# Patient Record
Sex: Female | Born: 1955 | Race: White | Hispanic: No | Marital: Married | Smoking: Former smoker
Health system: Southern US, Community
[De-identification: ages and names within clinical notes are randomized; demographics above are authoritative.]

## PROBLEM LIST (undated history)

## (undated) ENCOUNTER — Encounter: Attending: Certified Registered" | Primary: Certified Registered"

## (undated) ENCOUNTER — Telehealth: Attending: Certified Registered" | Primary: Certified Registered"

## (undated) ENCOUNTER — Telehealth

## (undated) ENCOUNTER — Encounter

## (undated) ENCOUNTER — Ambulatory Visit

## (undated) ENCOUNTER — Encounter: Attending: Physician Assistant | Primary: Physician Assistant

## (undated) ENCOUNTER — Encounter
Attending: Pharmacist Clinician (PhC)/ Clinical Pharmacy Specialist | Primary: Pharmacist Clinician (PhC)/ Clinical Pharmacy Specialist

## (undated) ENCOUNTER — Ambulatory Visit: Payer: PRIVATE HEALTH INSURANCE | Attending: Gastroenterology | Primary: Gastroenterology

## (undated) ENCOUNTER — Encounter: Attending: Gastroenterology | Primary: Gastroenterology

## (undated) ENCOUNTER — Ambulatory Visit: Payer: PRIVATE HEALTH INSURANCE

## (undated) ENCOUNTER — Ambulatory Visit: Payer: MEDICARE

## (undated) ENCOUNTER — Encounter: Attending: Rheumatology | Primary: Rheumatology

## (undated) ENCOUNTER — Ambulatory Visit: Payer: PRIVATE HEALTH INSURANCE | Attending: Nutritionist | Primary: Nutritionist

## (undated) ENCOUNTER — Inpatient Hospital Stay

## (undated) ENCOUNTER — Encounter: Attending: Family | Primary: Family

## (undated) ENCOUNTER — Ambulatory Visit
Payer: PRIVATE HEALTH INSURANCE | Attending: Student in an Organized Health Care Education/Training Program | Primary: Student in an Organized Health Care Education/Training Program

## (undated) DIAGNOSIS — M79643 Pain in unspecified hand: Secondary | ICD-10-CM

## (undated) DIAGNOSIS — L570 Actinic keratosis: Secondary | ICD-10-CM

## (undated) DIAGNOSIS — G4730 Sleep apnea, unspecified: Secondary | ICD-10-CM

## (undated) DIAGNOSIS — T753XXA Motion sickness, initial encounter: Secondary | ICD-10-CM

## (undated) DIAGNOSIS — H9190 Unspecified hearing loss, unspecified ear: Secondary | ICD-10-CM

## (undated) DIAGNOSIS — R748 Abnormal levels of other serum enzymes: Secondary | ICD-10-CM

## (undated) DIAGNOSIS — K652 Spontaneous bacterial peritonitis: Secondary | ICD-10-CM

## (undated) DIAGNOSIS — D649 Anemia, unspecified: Secondary | ICD-10-CM

## (undated) DIAGNOSIS — K7460 Unspecified cirrhosis of liver: Secondary | ICD-10-CM

## (undated) DIAGNOSIS — M1990 Unspecified osteoarthritis, unspecified site: Secondary | ICD-10-CM

## (undated) DIAGNOSIS — D509 Iron deficiency anemia, unspecified: Secondary | ICD-10-CM

## (undated) DIAGNOSIS — K219 Gastro-esophageal reflux disease without esophagitis: Secondary | ICD-10-CM

## (undated) DIAGNOSIS — Z8614 Personal history of Methicillin resistant Staphylococcus aureus infection: Secondary | ICD-10-CM

## (undated) DIAGNOSIS — R42 Dizziness and giddiness: Secondary | ICD-10-CM

## (undated) DIAGNOSIS — E039 Hypothyroidism, unspecified: Secondary | ICD-10-CM

## (undated) DIAGNOSIS — M542 Cervicalgia: Secondary | ICD-10-CM

## (undated) DIAGNOSIS — K754 Autoimmune hepatitis: Secondary | ICD-10-CM

## (undated) HISTORY — DX: Iron deficiency anemia, unspecified: D50.9

## (undated) HISTORY — PX: NASAL SINUS SURGERY: SHX719

## (undated) HISTORY — DX: Actinic keratosis: L57.0

## (undated) HISTORY — DX: Sleep apnea, unspecified: G47.30

## (undated) HISTORY — PX: OTHER SURGICAL HISTORY: SHX169

## (undated) HISTORY — DX: Unspecified cirrhosis of liver: K74.60

## (undated) HISTORY — PX: ABDOMINAL HYSTERECTOMY: SHX81

## (undated) HISTORY — PX: SALPINGOOPHORECTOMY: SHX82

## (undated) HISTORY — DX: Autoimmune hepatitis: K75.4

## (undated) HISTORY — DX: Abnormal levels of other serum enzymes: R74.8

## (undated) HISTORY — DX: Pain in unspecified hand: M79.643

## (undated) HISTORY — DX: Personal history of Methicillin resistant Staphylococcus aureus infection: Z86.14

## (undated) MED ORDER — POLYETHYLENE GLYCOL 3350 17 GRAM ORAL POWDER PACKET: Freq: Every day | ORAL | 0 days | PRN

## (undated) MED ORDER — OXYBUTYNIN CHLORIDE ER 10 MG TABLET,EXTENDED RELEASE 24 HR: Freq: Every day | ORAL | 0 days

---
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
## 2004-09-12 ENCOUNTER — Ambulatory Visit: Payer: Self-pay | Admitting: Rheumatology

## 2004-11-12 ENCOUNTER — Ambulatory Visit: Payer: Self-pay | Admitting: Obstetrics and Gynecology

## 2005-11-18 ENCOUNTER — Ambulatory Visit: Payer: Self-pay | Admitting: Obstetrics and Gynecology

## 2006-02-25 ENCOUNTER — Ambulatory Visit: Payer: Self-pay | Admitting: Unknown Physician Specialty

## 2006-03-30 ENCOUNTER — Ambulatory Visit: Payer: Self-pay | Admitting: Unknown Physician Specialty

## 2006-04-03 ENCOUNTER — Emergency Department: Payer: Self-pay | Admitting: Emergency Medicine

## 2006-11-23 ENCOUNTER — Ambulatory Visit: Payer: Self-pay | Admitting: Obstetrics and Gynecology

## 2007-11-30 ENCOUNTER — Ambulatory Visit: Payer: Self-pay | Admitting: Obstetrics and Gynecology

## 2008-12-05 ENCOUNTER — Ambulatory Visit: Payer: Self-pay

## 2008-12-18 ENCOUNTER — Emergency Department: Payer: Self-pay | Admitting: Emergency Medicine

## 2010-02-06 ENCOUNTER — Ambulatory Visit: Payer: Self-pay

## 2011-02-18 ENCOUNTER — Ambulatory Visit: Payer: Self-pay | Admitting: Obstetrics and Gynecology

## 2011-07-19 ENCOUNTER — Ambulatory Visit: Payer: Self-pay | Admitting: Internal Medicine

## 2012-03-19 HISTORY — PX: INNER EAR SURGERY: SHX679

## 2012-04-13 ENCOUNTER — Ambulatory Visit: Payer: Self-pay | Admitting: Obstetrics and Gynecology

## 2013-01-27 DIAGNOSIS — E039 Hypothyroidism, unspecified: Secondary | ICD-10-CM | POA: Insufficient documentation

## 2013-04-27 ENCOUNTER — Ambulatory Visit: Payer: Self-pay | Admitting: Obstetrics and Gynecology

## 2013-08-10 ENCOUNTER — Ambulatory Visit: Payer: Self-pay | Admitting: Rheumatology

## 2013-08-14 ENCOUNTER — Emergency Department: Payer: Self-pay | Admitting: Emergency Medicine

## 2013-08-14 LAB — BASIC METABOLIC PANEL
Anion Gap: 5 — ABNORMAL LOW (ref 7–16)
BUN: 8 mg/dL (ref 7–18)
Calcium, Total: 9 mg/dL (ref 8.5–10.1)
Chloride: 99 mmol/L (ref 98–107)
Co2: 30 mmol/L (ref 21–32)
Creatinine: 0.76 mg/dL (ref 0.60–1.30)
EGFR (African American): >60
EGFR (Non-African Amer.): >60
Glucose: 108 mg/dL — ABNORMAL HIGH (ref 65–99)
Osmolality: 267 (ref 275–301)
Potassium: 2.8 mmol/L — ABNORMAL LOW (ref 3.5–5.1)
Sodium: 134 mmol/L — ABNORMAL LOW (ref 136–145)

## 2013-08-14 LAB — CBC
HGB: 14.6 g/dL (ref 12.0–16.0)
MCH: 32.5 pg (ref 26.0–34.0)
RDW: 12.4 % (ref 11.5–14.5)

## 2013-08-14 LAB — TROPONIN I: Troponin-I: <0.02 ng/mL

## 2013-08-14 LAB — SEDIMENTATION RATE: Erythrocyte Sed Rate: 47 mm/hr — ABNORMAL HIGH (ref 0–30)

## 2013-08-15 ENCOUNTER — Other Ambulatory Visit: Payer: Self-pay | Admitting: Rheumatology

## 2013-08-15 LAB — HEPATIC FUNCTION PANEL A (ARMC)
Alkaline Phosphatase: 96 U/L (ref 50–136)
Bilirubin, Direct: 0.2 mg/dL (ref 0.00–0.20)
SGOT(AST): 80 U/L — ABNORMAL HIGH (ref 15–37)
Total Protein: 8.2 g/dL (ref 6.4–8.2)

## 2013-08-20 LAB — CULTURE, BLOOD (SINGLE)

## 2014-05-02 DIAGNOSIS — H900 Conductive hearing loss, bilateral: Secondary | ICD-10-CM | POA: Insufficient documentation

## 2014-07-24 ENCOUNTER — Ambulatory Visit: Payer: Self-pay | Admitting: Obstetrics and Gynecology

## 2014-12-20 DIAGNOSIS — M79643 Pain in unspecified hand: Secondary | ICD-10-CM

## 2014-12-20 HISTORY — DX: Pain in unspecified hand: M79.643

## 2015-01-25 ENCOUNTER — Ambulatory Visit
Admit: 2015-01-25 | Disposition: A | Discharge status: Home / Self Care | Payer: Self-pay | Attending: Rheumatology | Admitting: Rheumatology

## 2015-01-29 ENCOUNTER — Ambulatory Visit
Admit: 2015-01-29 | Disposition: A | Discharge status: Home / Self Care | Payer: Self-pay | Attending: Gastroenterology | Admitting: Gastroenterology

## 2015-01-31 ENCOUNTER — Ambulatory Visit
Admit: 2015-01-31 | Disposition: A | Discharge status: Home / Self Care | Payer: Self-pay | Attending: Gastroenterology | Admitting: Gastroenterology

## 2015-02-16 ENCOUNTER — Other Ambulatory Visit: Payer: Self-pay | Admitting: Gastroenterology

## 2015-02-16 DIAGNOSIS — R945 Abnormal results of liver function studies: Principal | ICD-10-CM

## 2015-02-16 DIAGNOSIS — R7989 Other specified abnormal findings of blood chemistry: Secondary | ICD-10-CM

## 2015-03-05 ENCOUNTER — Other Ambulatory Visit: Payer: Self-pay | Admitting: Radiology

## 2015-03-06 ENCOUNTER — Ambulatory Visit
Admission: RE | Admit: 2015-03-06 | Discharge: 2015-03-06 | Disposition: A | Discharge status: Home / Self Care | Payer: BC Managed Care – PPO | Source: Ambulatory Visit | Attending: Gastroenterology | Admitting: Gastroenterology

## 2015-03-06 DIAGNOSIS — K7460 Unspecified cirrhosis of liver: Secondary | ICD-10-CM | POA: Diagnosis not present

## 2015-03-06 DIAGNOSIS — K739 Chronic hepatitis, unspecified: Secondary | ICD-10-CM | POA: Insufficient documentation

## 2015-03-06 DIAGNOSIS — R945 Abnormal results of liver function studies: Secondary | ICD-10-CM

## 2015-03-06 DIAGNOSIS — R740 Nonspecific elevation of levels of transaminase and lactic acid dehydrogenase [LDH]: Secondary | ICD-10-CM | POA: Diagnosis present

## 2015-03-06 DIAGNOSIS — R7989 Other specified abnormal findings of blood chemistry: Secondary | ICD-10-CM

## 2015-03-06 HISTORY — DX: Anemia, unspecified: D64.9

## 2015-03-06 HISTORY — DX: Unspecified hearing loss, unspecified ear: H91.90

## 2015-03-06 HISTORY — DX: Unspecified osteoarthritis, unspecified site: M19.90

## 2015-03-06 HISTORY — DX: Gastro-esophageal reflux disease without esophagitis: K21.9

## 2015-03-06 HISTORY — DX: Hypothyroidism, unspecified: E03.9

## 2015-03-06 LAB — CBC
HEMATOCRIT: 39.2 % (ref 35.0–47.0)
HEMOGLOBIN: 13.1 g/dL (ref 12.0–16.0)
MCH: 30.5 pg (ref 26.0–34.0)
MCHC: 33.5 g/dL (ref 32.0–36.0)
MCV: 91.2 fL (ref 80.0–100.0)
PLATELETS: 67 10*3/uL — AB (ref 150–440)
RBC: 4.3 MIL/uL (ref 3.80–5.20)
RDW: 13.2 % (ref 11.5–14.5)
WBC: 4.3 10*3/uL (ref 3.6–11.0)

## 2015-03-06 LAB — PROTIME-INR
INR: 1.27
Prothrombin Time: 16.1 seconds — ABNORMAL HIGH (ref 11.4–15.0)

## 2015-03-06 LAB — APTT: aPTT: 33 seconds (ref 24–36)

## 2015-03-06 MED ORDER — SODIUM CHLORIDE 0.9 % IV SOLN
Freq: Once | INTRAVENOUS | Status: AC
  Administered 2015-03-06: 10 mL via INTRAVENOUS

## 2015-03-06 MED ORDER — FENTANYL CITRATE (PF) 100 MCG/2ML IJ SOLN
INTRAMUSCULAR | Status: AC | PRN
  Administered 2015-03-06: 50 ug via INTRAVENOUS

## 2015-03-06 MED ORDER — MIDAZOLAM HCL 5 MG/5ML IJ SOLN
INTRAMUSCULAR | Status: AC | PRN
  Administered 2015-03-06: 1 mg via INTRAVENOUS

## 2015-03-06 NOTE — Progress Notes (Signed)
BP low post sedation-87/48. Fluid bolus given.

## 2015-03-06 NOTE — Procedures (Signed)
Interventional Radiology Procedure Note  Procedure: US guided medical-liver biopsy, for transaminitis.  Complications: None Recommendations:  - observation for 2 hours - follow up pathology - advil/ibuprofen for any routine discomfort  Signed,  Dulcy Fanny. Earleen Newport, DO

## 2015-03-06 NOTE — H&P (Signed)
Chief Complaint: Transaminitis  Referring Physician(s): Dr. Lucilla Lame  History of Present Illness: Barbara Moss is a 59 y.o. female presenting for an elective, outpatient percutaneous biopsy to evaluate for etiology of transaminitis.    She has a history negative for hepatitis viral infection, with negative serology.  Pre-operative laboratory studies also reveal thrombocytopenia, with a value of 67K.  This will not strictly preclude the percutaneous sample, but informed consent will be obtained for increased risk of bleeding.     Past Medical History  Diagnosis Date  . Hypothyroidism   . GERD (gastroesophageal reflux disease)   . Arthritis   . Anemia   . Hearing decreased     Past Surgical History  Procedure Laterality Date  . Abdominal hysterectomy      Allergies: Review of patient's allergies indicates no known allergies.  Medications: Prior to Admission medications   Medication Sig Start Date End Date Taking? Authorizing Provider  calcium-vitamin D (OSCAL WITH D) 500-200 MG-UNIT per tablet Take 1 tablet by mouth.   Yes Historical Provider, MD  estropipate (OGEN) 0.75 MG tablet Take 0.75 mg by mouth daily.   Yes Historical Provider, MD  ferrous fumarate (HEMOCYTE - 106 MG FE) 325 (106 FE) MG TABS tablet Take 1 tablet by mouth.   Yes Historical Provider, MD  folic acid (FOLVITE) 1 MG tablet Take 2 mg by mouth daily.   Yes Historical Provider, MD  Golimumab 100 MG/ML SOAJ Inject into the skin.   Yes Historical Provider, MD  hydrochlorothiazide (HYDRODIURIL) 25 MG tablet Take 25 mg by mouth daily.   Yes Historical Provider, MD  IRON, FERROUS GLUCONATE, PO Take 65 mg by mouth.   Yes Historical Provider, MD  levothyroxine (SYNTHROID, LEVOTHROID) 125 MCG tablet Take 125 mcg by mouth daily before breakfast.   Yes Historical Provider, MD  omeprazole (PRILOSEC) 10 MG capsule Take 10 mg by mouth daily.   Yes Historical Provider, MD  vitamin B-12 (CYANOCOBALAMIN) 1000 MCG  tablet Take 1,000 mcg by mouth daily.   Yes Historical Provider, MD     History reviewed. No pertinent family history.  History   Social History  . Marital Status: Married    Spouse Name: N/A  . Number of Children: N/A  . Years of Education: N/A   Social History Main Topics  . Smoking status: Former Research scientist (life sciences)  . Smokeless tobacco: Not on file  . Alcohol Use: Yes     Comment: occasioanl  . Drug Use: No  . Sexual Activity: Not on file   Other Topics Concern  . None   Social History Narrative    Review of Systems: A 12 point ROS discussed and pertinent positives are indicated in the HPI above.  All other systems are negative.  Review of Systems  Vital Signs: BP 105/56 mmHg  Pulse 69  Temp(Src) 98 F (36.7 C) (Oral)  Resp 18  Ht 4\' 11"  (1.499 m)  Wt 135 lb (61.236 kg)  BMI 27.25 kg/m2  Physical Exam   Atraumatic, normocephalic. Mucous membranes moist, pink.  Neck soft supple.  Vital signs WNL.  CTA bilateral.  Neg for W/R/R RRR with no third heart sounds.   Abd soft, NT, ND.   GU deferred. Moving all 4 extremities.  Sensory grossly intact.    Mallampati Score:  2  Imaging: US shows irregular liver echogenicity.  Borderline enlarged spleen.   Labs:  CBC:  Recent Labs  03/06/15 0716  WBC 4.3  HGB 13.1  HCT  39.2  PLT 67*    COAGS:  Recent Labs  03/06/15 0716  INR 1.27  APTT 33    BMP: No results for input(s): NA, K, CL, CO2, GLUCOSE, BUN, CALCIUM, CREATININE, GFRNONAA, GFRAA in the last 8760 hours.  Invalid input(s): CMP  LIVER FUNCTION TESTS: No results for input(s): BILITOT, AST, ALT, ALKPHOS, PROT, ALBUMIN in the last 8760 hours.  TUMOR MARKERS: No results for input(s): AFPTM, CEA, CA199, CHROMGRNA in the last 8760 hours.  Assessment and Plan:  59 yo female for percutaneous liver biopsy to evaluate for a reason for transaminitis.    She also has thrombocytopenia.   Risks and Benefits discussed with the patient including, but  not limited to bleeding, infection, damage to adjacent structures or low yield requiring additional tests.  In particular, her thrombocytopenia was discussed, with the benefit of evaluating the potential liver dysfunction outweighing the slight increased risk of bleeding.    All of the patient's questions were answered, patient is agreeable to proceed. Consent signed and in chart.  Signed,  Dulcy Fanny. Earleen Newport, DO     Thank you for this interesting consult.  I greatly enjoyed meeting Barbara Moss and look forward to participating in their care.  SignedCorrie Mckusick 03/06/2015, 8:13 AM   I spent a total of  20 Minutes   in face to face in clinical consultation, greater than 50% of which was counseling/coordinating care for percutaneous liver biopsy, transaminitis.

## 2015-03-08 LAB — SURGICAL PATHOLOGY

## 2015-03-19 ENCOUNTER — Ambulatory Visit (INDEPENDENT_AMBULATORY_CARE_PROVIDER_SITE_OTHER): Payer: BC Managed Care – PPO | Admitting: Gastroenterology

## 2015-03-19 ENCOUNTER — Encounter: Payer: Self-pay | Admitting: Gastroenterology

## 2015-03-19 ENCOUNTER — Other Ambulatory Visit: Payer: Self-pay

## 2015-03-19 VITALS — BP 121/64 | HR 79 | Temp 97.9°F | Ht 59.0 in | Wt 141.0 lb

## 2015-03-19 DIAGNOSIS — G4730 Sleep apnea, unspecified: Secondary | ICD-10-CM

## 2015-03-19 DIAGNOSIS — R748 Abnormal levels of other serum enzymes: Secondary | ICD-10-CM | POA: Insufficient documentation

## 2015-03-19 DIAGNOSIS — K7460 Unspecified cirrhosis of liver: Secondary | ICD-10-CM

## 2015-03-19 DIAGNOSIS — E079 Disorder of thyroid, unspecified: Secondary | ICD-10-CM

## 2015-03-19 DIAGNOSIS — D509 Iron deficiency anemia, unspecified: Secondary | ICD-10-CM | POA: Insufficient documentation

## 2015-03-19 DIAGNOSIS — M059 Rheumatoid arthritis with rheumatoid factor, unspecified: Secondary | ICD-10-CM | POA: Insufficient documentation

## 2015-03-19 DIAGNOSIS — T7840XA Allergy, unspecified, initial encounter: Secondary | ICD-10-CM | POA: Insufficient documentation

## 2015-03-19 DIAGNOSIS — K219 Gastro-esophageal reflux disease without esophagitis: Secondary | ICD-10-CM | POA: Insufficient documentation

## 2015-03-19 HISTORY — DX: Unspecified cirrhosis of liver: K74.60

## 2015-03-19 HISTORY — DX: Iron deficiency anemia, unspecified: D50.9

## 2015-03-19 HISTORY — DX: Abnormal levels of other serum enzymes: R74.8

## 2015-03-19 HISTORY — DX: Disorder of thyroid, unspecified: E07.9

## 2015-03-19 HISTORY — DX: Sleep apnea, unspecified: G47.30

## 2015-03-19 MED ORDER — PREDNISONE 10 MG PO TABS: 60.0000 mg | ORAL_TABLET | Freq: Every day | ORAL | Status: DC

## 2015-03-19 NOTE — Progress Notes (Signed)
Primary Care Physician: Emmaline Kluver, MD  Primary Gastroenterologist:  Dr. Lucilla Lame  Chief Complaint  Patient presents with  . follow up liver biospsy    HPI: Barbara Moss is a 59 y.o. female here follow-up after having a liver biopsy. The patient was found to have 3 out of 4 inflammation and 4 out of 4 stage of damage consistent with cirrhosis. The etiology of the cirrhosis could not be obtained through the liver biopsy due to the amount of cirrhosis on the liver biopsy.  Current Outpatient Prescriptions  Medication Sig Dispense Refill  . B COMPLEX VITAMINS ER PO Take by mouth daily.    . calcium-vitamin D (OSCAL WITH D) 500-200 MG-UNIT per tablet Take 1 tablet by mouth.    . ESTROPIPATE PO Take by mouth daily.    . ferrous fumarate (HEMOCYTE - 106 MG FE) 325 (106 FE) MG TABS tablet Take 1 tablet by mouth.    . folic acid (FOLVITE) 1 MG tablet Take 2 mg by mouth daily.    . Golimumab 50 MG/0.5ML SOAJ Inject 50 mLs into the skin.    . hydrochlorothiazide (HYDRODIURIL) 25 MG tablet Take 25 mg by mouth daily.    Marland Kitchen levothyroxine (SYNTHROID, LEVOTHROID) 125 MCG tablet Take 125 mcg by mouth daily before breakfast.    . omeprazole (PRILOSEC) 40 MG capsule Take 40 mg by mouth daily.    . vitamin B-12 (CYANOCOBALAMIN) 1000 MCG tablet Take 1,000 mcg by mouth daily.    Marland Kitchen estropipate (OGEN) 0.75 MG tablet Take 0.75 mg by mouth daily.    . Golimumab 100 MG/ML SOAJ Inject into the skin.    . hydrochlorothiazide (HYDRODIURIL) 25 MG tablet Take 25 mg by mouth daily.    . IRON, FERROUS GLUCONATE, PO Take 65 mg by mouth.    . IRON, IRON, Take 65 mg by mouth daily.    Marland Kitchen levothyroxine (SYNTHROID, LEVOTHROID) 100 MCG tablet Take 100 mcg by mouth daily.    Marland Kitchen omeprazole (PRILOSEC) 10 MG capsule Take 10 mg by mouth daily.    . predniSONE (DELTASONE) 10 MG tablet Take 6 tablets (60 mg total) by mouth daily with breakfast. 60mg /day for 1 week then 40mg /day for a week then 30mg /day for 2  weeks then 20 mg a day. 102 tablet 6   No current facility-administered medications for this visit.    Allergies as of 03/19/2015  . (No Known Allergies)    ROS:  General: Negative for anorexia, weight loss, fever, chills, fatigue, weakness. ENT: Negative for hoarseness, difficulty swallowing , nasal congestion. CV: Negative for chest pain, angina, palpitations, dyspnea on exertion, peripheral edema.  Respiratory: Negative for dyspnea at rest, dyspnea on exertion, cough, sputum, wheezing.  GI: See history of present illness. GU:  Negative for dysuria, hematuria, urinary incontinence, urinary frequency, nocturnal urination.  Endo: Negative for unusual weight change.    Physical Examination:   BP 121/64 mmHg  Pulse 79  Temp(Src) 97.9 F (36.6 C) (Oral)  Ht 4\' 11"  (1.499 m)  Wt 141 lb (63.957 kg)  BMI 28.46 kg/m2  General: Well-nourished, well-developed in no acute distress.  Eyes: No icterus. Conjunctivae pink. Mouth: Oropharyngeal mucosa moist and pink , no lesions erythema or exudate. Lungs: Clear to auscultation bilaterally. Non-labored. Heart: Regular rate and rhythm, no murmurs rubs or gallops.  Abdomen: Bowel sounds are normal, nontender, nondistended, no hepatosplenomegaly or masses, no abdominal bruits or hernia , no rebound or guarding.   Extremities: No lower extremity  edema. No clubbing or deformities. Neuro: Alert and oriented x 3.  Grossly intact. Skin: Warm and dry, no jaundice.   Psych: Alert and cooperative, normal mood and affect.   Imaging Studies: US Biopsy  03/06/2015   CLINICAL DATA:  59 year old female with a history of transaminitis. She has been referred for percutaneous biopsy.  EXAM: ULTRASOUND GUIDED CORE BIOPSY OF LIVER  MEDICATIONS: 1.0 mg IV Versed; 50 mcg IV Fentanyl  Total Moderate Sedation Time: 17 minutes  PROCEDURE: The procedure, risks, benefits, and alternatives were explained to the patient. Questions regarding the procedure were  encouraged and answered. The patient understands and consents to the procedure.  Ultrasound survey was performed with images stored and sent to PACs.  The right upper quadrant was prepped with chlorhexidine in a sterile fashion, and a sterile drape was applied covering the operative field. A sterile gown and sterile gloves were used for the procedure. Local anesthesia was provided with 1% Lidocaine.  Ultrasound guidance was used to infiltrate the skin and subcutaneous tissues to the level of the liver capsule for anesthesia. A small stab incision was then made with 11 blade scalpel, and a 17 gauge guide needle was advanced into the right liver lobe. Three separate 18 gauge core biopsy were then retrieved with ultrasound observation. Images were stored.  Once the samples were placed into formalin solution, 4 Gel-Foam pledgets were infused with a small amount of sterile saline. Needle was removed. Final images were stored.  Patient tolerated the procedure well and remained hemodynamically stable throughout.  No complications were encountered and no significant blood loss was encounter  COMPLICATIONS: None.  FINDINGS: Ultrasound survey demonstrates heterogeneous liver parenchyma.  Images during the case demonstrate safe placement of 17 gauge guide needle into the right liver lobe. Three separate 18 gauge core biopsy were retrieved.  After infusion of Gel-Foam pledgets a final image demonstrates gas within the liver parenchyma, with no fluid on the liver capsule.  IMPRESSION: Status post ultrasound-guided medical liver biopsy of right liver lobe, with tissue specimen sent to pathology for complete histopathologic analysis.  Signed,  Dulcy Fanny. Earleen Newport DO  [facility]  [facility]  INDICATION: 59 year old female with a history of transaminitis.   Electronically Signed   By: Corrie Mckusick D.O.   On: 03/06/2015 11:12

## 2015-03-19 NOTE — Assessment & Plan Note (Signed)
This patient is a 59 year old woman who comes in for follow-up of her liver biopsy. The patient's liver biopsy showed cirrhosis with inflammation 3 out of 4. 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 The patient will be started on steroid-induced for the autoimmune hepatitis and she will follow up in 4 weeks

## 2015-03-20 ENCOUNTER — Telehealth: Payer: Self-pay | Admitting: Gastroenterology

## 2015-03-20 NOTE — Telephone Encounter (Signed)
Pt said medication was not faxed in yesterday to [facility]. In [place] # [phone]

## 2015-03-21 ENCOUNTER — Other Ambulatory Visit: Payer: Self-pay

## 2015-03-21 DIAGNOSIS — K7460 Unspecified cirrhosis of liver: Secondary | ICD-10-CM

## 2015-03-21 MED ORDER — PREDNISONE 10 MG PO TABS: 60.0000 mg | ORAL_TABLET | Freq: Every day | ORAL | Status: DC

## 2015-03-21 NOTE — Telephone Encounter (Signed)
Spoke with pt and informed her I will call in her rx to her pharmacy. Advised her to let me know if there is a problem.

## 2015-04-17 ENCOUNTER — Ambulatory Visit (INDEPENDENT_AMBULATORY_CARE_PROVIDER_SITE_OTHER): Payer: BC Managed Care – PPO | Admitting: Gastroenterology

## 2015-04-17 VITALS — BP 122/59 | HR 80 | Temp 98.1°F | Ht 59.0 in | Wt 141.0 lb

## 2015-04-17 DIAGNOSIS — R748 Abnormal levels of other serum enzymes: Secondary | ICD-10-CM

## 2015-04-17 NOTE — Progress Notes (Signed)
Primary Care Physician: Emmaline Kluver, MD  Primary Gastroenterologist:  Dr. Lucilla Lame  Chief Complaint  Patient presents with  . F/U cirrhosis    4 weeks    HPI: Barbara Moss is a 59 y.o. female here for follow-up of abnormal liver enzymes. This patient has biopsy-proven cirrhosis. She also had a high IgG with a high ANA area the patient's liver biopsy did not show the cause for her cirrhosis or abnormal liver enzymes but with her history of rheumatoid arthritis and with her abnormal liver enzymes autoimmune hepatitis was the most likely cause. The patient was started on steroids and states she is now down to 3 tablets a day. She reports that she has some abdominal bloating in the upper abdomen otherwise she is doing well.  Current Outpatient Prescriptions  Medication Sig Dispense Refill  . B COMPLEX VITAMINS ER PO Take by mouth daily.    . calcium-vitamin D (OSCAL WITH D) 500-200 MG-UNIT per tablet Take 1 tablet by mouth.    . ferrous fumarate (HEMOCYTE - 106 MG FE) 325 (106 FE) MG TABS tablet Take 1 tablet by mouth.    . folic acid (FOLVITE) 1 MG tablet Take 2 mg by mouth daily.    . Golimumab 50 MG/0.5ML SOAJ Inject 50 mLs into the skin.    . hydrochlorothiazide (HYDRODIURIL) 25 MG tablet Take 25 mg by mouth daily.    Marland Kitchen levothyroxine (SYNTHROID, LEVOTHROID) 100 MCG tablet Take 100 mcg by mouth daily.    Marland Kitchen omeprazole (PRILOSEC) 10 MG capsule Take 10 mg by mouth daily.    . predniSONE (DELTASONE) 10 MG tablet Take 6 tablets (60 mg total) by mouth daily with breakfast. 60mg /day for 1 week then 40mg /day for a week then 30mg /day for 2 weeks then 20 mg a day. 102 tablet 6  . estropipate (OGEN) 0.75 MG tablet Take 0.75 mg by mouth daily.    Marland Kitchen ESTROPIPATE PO Take by mouth daily.    Marland Kitchen omeprazole (PRILOSEC) 40 MG capsule Take 40 mg by mouth daily.     No current facility-administered medications for this visit.    Allergies as of 04/17/2015  . (No Known Allergies)     ROS:  General: Negative for anorexia, weight loss, fever, chills, fatigue, weakness. ENT: Negative for hoarseness, difficulty swallowing , nasal congestion. CV: Negative for chest pain, angina, palpitations, dyspnea on exertion, peripheral edema.  Respiratory: Negative for dyspnea at rest, dyspnea on exertion, cough, sputum, wheezing.  GI: See history of present illness. GU:  Negative for dysuria, hematuria, urinary incontinence, urinary frequency, nocturnal urination.  Endo: Negative for unusual weight change.    Physical Examination:   BP 122/59 mmHg  Pulse 80  Temp(Src) 98.1 F (36.7 C)  Ht 4\' 11"  (1.499 m)  Wt 141 lb (63.957 kg)  BMI 28.46 kg/m2  General: Well-nourished, well-developed in no acute distress.  Eyes: No icterus. Conjunctivae pink. Mouth: Oropharyngeal mucosa moist and pink , no lesions erythema or exudate. Lungs: Clear to auscultation bilaterally. Non-labored. Heart: Regular rate and rhythm, no murmurs rubs or gallops.  Abdomen: Bowel sounds are normal, nontender, nondistended, no hepatosplenomegaly or masses, no abdominal bruits or hernia , no rebound or guarding.   Extremities: No lower extremity edema. No clubbing or deformities. Neuro: Alert and oriented x 3.  Grossly intact. Skin: Warm and dry, no jaundice.   Psych: Alert and cooperative, normal mood and affect.  Labs:    Imaging Studies: No results found.  Assessment and  Plan:   Barbara Moss is a 59 y.o. y/o female who has a history of cirrhosis which appears to be idiopathic although autoimmune seems the most likely cause. The patient will have her liver enzymes checked today and if they are going down and she may need to stay on the steroids versus adding 6-MP with the steroids. The patient has been explained the plan. Her previous liver enzymes showed a AST of 169 and ALT of 99.

## 2015-04-18 LAB — HEPATIC FUNCTION PANEL
ALT: 34 IU/L — ABNORMAL HIGH (ref 0–32)
AST: 27 IU/L (ref 0–40)
Albumin: 3.7 g/dL (ref 3.5–5.5)
Alkaline Phosphatase: 113 IU/L (ref 39–117)
Bilirubin Total: 0.6 mg/dL (ref 0.0–1.2)
Bilirubin, Direct: 0.22 mg/dL (ref 0.00–0.40)
TOTAL PROTEIN: 6.6 g/dL (ref 6.0–8.5)

## 2015-04-20 ENCOUNTER — Telehealth: Payer: Self-pay

## 2015-04-20 DIAGNOSIS — K7460 Unspecified cirrhosis of liver: Secondary | ICD-10-CM

## 2015-04-20 NOTE — Telephone Encounter (Signed)
-----   Message from Lucilla Lame, MD sent at 04/19/2015 12:37 PM EDT ----- Let The patient know her labs are better adn to continue her medications. Repeat LFT's in 4 weeks.

## 2015-04-20 NOTE — Telephone Encounter (Signed)
Pt notified of results and recommendation for labs to be repeated in 4 weeks. Order has been put into [facility].

## 2015-04-23 DIAGNOSIS — K8020 Calculus of gallbladder without cholecystitis without obstruction: Secondary | ICD-10-CM | POA: Insufficient documentation

## 2015-04-23 DIAGNOSIS — K754 Autoimmune hepatitis: Secondary | ICD-10-CM

## 2015-04-23 HISTORY — DX: Autoimmune hepatitis: K75.4

## 2015-05-16 LAB — HEPATIC FUNCTION PANEL
ALBUMIN: 3.8 g/dL (ref 3.5–5.5)
ALK PHOS: 81 IU/L (ref 39–117)
ALT: 44 IU/L — ABNORMAL HIGH (ref 0–32)
AST: 53 IU/L — ABNORMAL HIGH (ref 0–40)
BILIRUBIN, DIRECT: 0.21 mg/dL (ref 0.00–0.40)
Bilirubin Total: 0.7 mg/dL (ref 0.0–1.2)
Total Protein: 6.9 g/dL (ref 6.0–8.5)

## 2015-05-25 ENCOUNTER — Telehealth: Payer: Self-pay

## 2015-05-25 NOTE — Telephone Encounter (Signed)
LVM on pt's cell phone to call and schedule follow up. Tried contacting pt on home number but there was no vm to leave message.

## 2015-05-25 NOTE — Telephone Encounter (Signed)
-----   Message from Lucilla Lame, MD sent at 05/21/2015 12:56 PM EDT ----- Have the patient come in. Her LFT's are up.

## 2015-06-01 ENCOUNTER — Other Ambulatory Visit: Payer: Self-pay

## 2015-06-05 ENCOUNTER — Encounter: Payer: Self-pay | Admitting: Gastroenterology

## 2015-06-05 ENCOUNTER — Other Ambulatory Visit
Admission: RE | Admit: 2015-06-05 | Discharge: 2015-06-05 | Disposition: A | Discharge status: Home / Self Care | Payer: BC Managed Care – PPO | Source: Ambulatory Visit | Attending: Gastroenterology | Admitting: Gastroenterology

## 2015-06-05 ENCOUNTER — Ambulatory Visit (INDEPENDENT_AMBULATORY_CARE_PROVIDER_SITE_OTHER): Payer: BC Managed Care – PPO | Admitting: Gastroenterology

## 2015-06-05 VITALS — BP 131/76 | HR 92 | Temp 97.6°F | Ht <= 58 in | Wt 147.6 lb

## 2015-06-05 DIAGNOSIS — K754 Autoimmune hepatitis: Secondary | ICD-10-CM

## 2015-06-05 LAB — CBC WITH DIFFERENTIAL/PLATELET
BASOS ABS: 0.1 10*3/uL (ref 0–0.1)
Basophils Relative: 1 %
EOS PCT: 7 %
Eosinophils Absolute: 0.6 10*3/uL (ref 0–0.7)
HCT: 44.1 % (ref 35.0–47.0)
Hemoglobin: 15.1 g/dL (ref 12.0–16.0)
LYMPHS PCT: 26 %
Lymphs Abs: 2.2 10*3/uL (ref 1.0–3.6)
MCH: 30.9 pg (ref 26.0–34.0)
MCHC: 34.2 g/dL (ref 32.0–36.0)
MCV: 90.2 fL (ref 80.0–100.0)
MONO ABS: 0.5 10*3/uL (ref 0.2–0.9)
Monocytes Relative: 6 %
Neutro Abs: 5.2 10*3/uL (ref 1.4–6.5)
Neutrophils Relative %: 60 %
PLATELETS: 108 10*3/uL — AB (ref 150–440)
RBC: 4.89 MIL/uL (ref 3.80–5.20)
RDW: 14.3 % (ref 11.5–14.5)
WBC: 8.5 10*3/uL (ref 3.6–11.0)

## 2015-06-05 LAB — HEPATIC FUNCTION PANEL
ALT: 38 U/L (ref 14–54)
AST: 53 U/L — ABNORMAL HIGH (ref 15–41)
Albumin: 3.5 g/dL (ref 3.5–5.0)
Alkaline Phosphatase: 64 U/L (ref 38–126)
BILIRUBIN DIRECT: 0.2 mg/dL (ref 0.1–0.5)
BILIRUBIN INDIRECT: 0.5 mg/dL (ref 0.3–0.9)
Total Bilirubin: 0.7 mg/dL (ref 0.3–1.2)
Total Protein: 6.6 g/dL (ref 6.5–8.1)

## 2015-06-05 NOTE — Progress Notes (Signed)
Primary Care Physician: Emmaline Kluver, MD  Primary Gastroenterologist:  Dr. Lucilla Lame  Chief Complaint  Patient presents with  . Follow up elevated liver enzymes    HPI: Barbara Moss is a 59 y.o. female here for follow-up of abnormal liver enzymes and a liver biopsy that was consistent with possible autoimmune hepatitis versus methotrexate damage. The patient did have improvement with her liver enzymes on prednisone and she states that her weight has gone up 12 pounds. The patient has been tapering off the steroids and 3 weeks ago her liver enzymes were still elevated although not as elevated as when she presented to me. The patient denies any alcohol abuse and now reports some right upper quadrant discomfort.  Current Outpatient Prescriptions  Medication Sig Dispense Refill  . B COMPLEX VITAMINS ER PO Take by mouth daily.    . calcium-vitamin D (OSCAL WITH D) 500-200 MG-UNIT per tablet Take 1 tablet by mouth.    . ferrous fumarate (HEMOCYTE - 106 MG FE) 325 (106 FE) MG TABS tablet Take 1 tablet by mouth.    . Golimumab 50 MG/0.5ML SOAJ Inject 50 mLs into the skin.    . hydrochlorothiazide (HYDRODIURIL) 25 MG tablet Take 25 mg by mouth daily.    Marland Kitchen levothyroxine (SYNTHROID, LEVOTHROID) 100 MCG tablet Take 100 mcg by mouth daily.    Marland Kitchen omeprazole (PRILOSEC) 40 MG capsule Take 40 mg by mouth daily.    . predniSONE (DELTASONE) 10 MG tablet Take 6 tablets (60 mg total) by mouth daily with breakfast. 60mg /day for 1 week then 40mg /day for a week then 30mg /day for 2 weeks then 20 mg a day. 102 tablet 6  . traMADol (ULTRAM) 50 MG tablet Take by mouth.    . estropipate (OGEN) 0.75 MG tablet Take 0.75 mg by mouth daily.    Marland Kitchen ESTROPIPATE PO Take by mouth daily.    . folic acid (FOLVITE) 1 MG tablet Take 2 mg by mouth daily.     No current facility-administered medications for this visit.    Allergies as of 06/05/2015  . (No Known Allergies)    ROS:  General: Negative for  anorexia, weight loss, fever, chills, fatigue, weakness. ENT: Negative for hoarseness, difficulty swallowing , nasal congestion. CV: Negative for chest pain, angina, palpitations, dyspnea on exertion, peripheral edema.  Respiratory: Negative for dyspnea at rest, dyspnea on exertion, cough, sputum, wheezing.  GI: See history of present illness. GU:  Negative for dysuria, hematuria, urinary incontinence, urinary frequency, nocturnal urination.  Endo: Negative for unusual weight change.    Physical Examination:   BP 131/76 mmHg  Pulse 92  Temp(Src) 97.6 F (36.4 C) (Oral)  Ht 4\' 10"  (1.473 m)  Wt 147 lb 9.6 oz (66.951 kg)  BMI 30.86 kg/m2  General: Well-nourished, well-developed in no acute distress.  Eyes: No icterus. Conjunctivae pink. Mouth: Oropharyngeal mucosa moist and pink , no lesions erythema or exudate. Lungs: Clear to auscultation bilaterally. Non-labored. Heart: Regular rate and rhythm, no murmurs rubs or gallops.  Abdomen: Bowel sounds are normal, nontender, nondistended, no hepatosplenomegaly or masses, no abdominal bruits or hernia , no rebound or guarding.   Extremities: No lower extremity edema. No clubbing or deformities. Neuro: Alert and oriented x 3.  Grossly intact. Skin: Warm and dry, no jaundice.   Psych: Alert and cooperative, normal mood and affect.  Labs:    Imaging Studies: No results found.  Assessment and Plan:   Barbara Moss is a 59 y.o.  y/o female who is being treated for possible autoimmune hepatitis with a history of other autoimmune diseases and a liver biopsy suggestive of possible autoimmune hepatitis versus alcohol abuse versus methotrexate-induced damage. The patient denies any alcohol abuse. The patient will have her blood sent off for TPMT and repeat liver enzymes. Pending these tests the patient may be started on Imuran 50 mg a day. Patient has been explained the plan and agrees with it.   Note: This dictation was prepared with Dragon  dictation along with smaller phrase technology. Any transcriptional errors that result from this process are unintentional.

## 2015-06-06 LAB — MISC LABCORP TEST (SEND OUT)

## 2015-06-07 ENCOUNTER — Telehealth: Payer: Self-pay

## 2015-06-07 NOTE — Telephone Encounter (Signed)
-----   Message from Lucilla Lame, MD sent at 06/05/2015  4:31 PM EDT ----- The patient know that her labs are better now than they were before but we will wait for the second lead test of the enzymes before starting her new medication.

## 2015-06-07 NOTE — Telephone Encounter (Signed)
Pt notified of results

## 2015-06-11 LAB — MISC LABCORP TEST (SEND OUT): LABCORP TEST CODE: 510750

## 2015-06-12 ENCOUNTER — Telehealth: Payer: Self-pay

## 2015-06-12 DIAGNOSIS — K754 Autoimmune hepatitis: Secondary | ICD-10-CM

## 2015-06-12 MED ORDER — AZATHIOPRINE 50 MG PO TABS: 50.0000 mg | ORAL_TABLET | Freq: Every day | ORAL | Status: DC

## 2015-06-12 NOTE — Telephone Encounter (Signed)
-----   Message from Lucilla Lame, MD sent at 06/12/2015  7:43 AM EDT ----- Let the patient know the blood test was normal and lets start her on imuran 50mg  a day with a CBC weekly for 4 weeks.

## 2015-06-12 NOTE — Telephone Encounter (Signed)
Pt notified of results. Rx for Imuran 50mg  sent to pharmacy.

## 2015-06-20 LAB — CBC WITH DIFFERENTIAL/PLATELET
BASOS ABS: 0.1 10*3/uL (ref 0.0–0.2)
Basos: 1 %
EOS (ABSOLUTE): 0.5 10*3/uL — AB (ref 0.0–0.4)
Eos: 5 %
Hematocrit: 46.6 % (ref 34.0–46.6)
Hemoglobin: 15.8 g/dL (ref 11.1–15.9)
IMMATURE GRANS (ABS): 0 10*3/uL (ref 0.0–0.1)
IMMATURE GRANULOCYTES: 0 %
LYMPHS: 26 %
Lymphocytes Absolute: 2.4 10*3/uL (ref 0.7–3.1)
MCH: 31 pg (ref 26.6–33.0)
MCHC: 33.9 g/dL (ref 31.5–35.7)
MCV: 91 fL (ref 79–97)
Monocytes Absolute: 0.5 10*3/uL (ref 0.1–0.9)
Monocytes: 5 %
NEUTROS PCT: 63 %
Neutrophils Absolute: 5.7 10*3/uL (ref 1.4–7.0)
PLATELETS: 120 10*3/uL — AB (ref 150–379)
RBC: 5.1 x10E6/uL (ref 3.77–5.28)
RDW: 14.2 % (ref 12.3–15.4)
WBC: 9.2 10*3/uL (ref 3.4–10.8)

## 2015-06-20 LAB — HEPATIC FUNCTION PANEL
ALBUMIN: 3.8 g/dL (ref 3.5–5.5)
ALT: 39 IU/L — AB (ref 0–32)
AST: 56 IU/L — AB (ref 0–40)
Alkaline Phosphatase: 76 IU/L (ref 39–117)
Bilirubin Total: 0.7 mg/dL (ref 0.0–1.2)
Bilirubin, Direct: 0.19 mg/dL (ref 0.00–0.40)
Total Protein: 6.7 g/dL (ref 6.0–8.5)

## 2015-06-21 ENCOUNTER — Telehealth: Payer: Self-pay

## 2015-06-21 NOTE — Telephone Encounter (Signed)
Pt notified of results. Continue with weekly labs as planned.

## 2015-06-21 NOTE — Telephone Encounter (Signed)
-----   Message from Lucilla Lame, MD sent at 06/20/2015  8:41 AM EDT ----- Let the patient know the liver enzymes are the same. Will likely need more time.

## 2015-06-23 LAB — THIOPURINE METHYLTRANSFERASE (TPMT), RBC: TPMT Activity:: 21.9 Units/mL RBC

## 2015-06-26 ENCOUNTER — Other Ambulatory Visit: Payer: Self-pay

## 2015-06-26 DIAGNOSIS — K7460 Unspecified cirrhosis of liver: Secondary | ICD-10-CM

## 2015-07-09 ENCOUNTER — Encounter: Payer: Self-pay | Admitting: Gastroenterology

## 2015-07-09 ENCOUNTER — Ambulatory Visit (INDEPENDENT_AMBULATORY_CARE_PROVIDER_SITE_OTHER): Payer: BC Managed Care – PPO | Admitting: Gastroenterology

## 2015-07-09 VITALS — BP 131/69 | HR 78 | Temp 97.9°F | Wt 149.0 lb

## 2015-07-09 DIAGNOSIS — K754 Autoimmune hepatitis: Secondary | ICD-10-CM | POA: Diagnosis not present

## 2015-07-09 NOTE — Progress Notes (Signed)
Primary Care Physician: Emmaline Kluver, MD  Primary Gastroenterologist:  Dr. Lucilla Lame  Chief Complaint  Patient presents with  . 1 month follow up    Imuran    HPI: Barbara Moss is a 59 y.o. female here follow-up of her abnormal liver enzymes. The patient had a liver biopsy that was consistent with either methotrexate versus alcohol versus autoimmune hepatitis. The patient has rheumatoid arthritis and had been on methotrexate in the past. The patient denies any alcohol abuse. 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 The patient states that she is on 20 mg of prednisone at the present time and she states that the Imuran is causing her to feel badly. He reports that she has gained some weight.  Current Outpatient Prescriptions  Medication Sig Dispense Refill  . azaTHIOprine (IMURAN) 50 MG tablet Take 1 tablet (50 mg total) by mouth daily. 30 tablet 6  . B COMPLEX VITAMINS ER PO Take by mouth daily.    . calcium-vitamin D (OSCAL WITH D) 500-200 MG-UNIT per tablet Take 1 tablet by mouth.    . estropipate (OGEN) 0.75 MG tablet Take 0.75 mg by mouth daily.    Marland Kitchen ESTROPIPATE PO Take by mouth daily.    . ferrous fumarate (HEMOCYTE - 106 MG FE) 325 (106 FE) MG TABS tablet Take 1 tablet by mouth.    . folic acid (FOLVITE) 1 MG tablet Take 2 mg by mouth daily.    . Golimumab 50 MG/0.5ML SOAJ Inject 50 mLs into the skin.    . hydrochlorothiazide (HYDRODIURIL) 25 MG tablet Take 25 mg by mouth daily.    Marland Kitchen levothyroxine (SYNTHROID, LEVOTHROID) 75 MCG tablet Take 75 mcg by mouth daily before breakfast.    . omeprazole (PRILOSEC) 40 MG capsule Take 40 mg by mouth daily.    . predniSONE (DELTASONE) 10 MG tablet Take 6 tablets (60 mg total) by mouth daily with breakfast. 60mg /day for 1 week then 40mg /day for a week then 30mg /day for 2 weeks then 20 mg a day. 102 tablet 6  . traMADol (ULTRAM) 50 MG tablet Take by  mouth.    . levothyroxine (SYNTHROID, LEVOTHROID) 100 MCG tablet Take 100 mcg by mouth daily.     No current facility-administered medications for this visit.    Allergies as of 07/09/2015  . (No Known Allergies)    ROS:  General: Negative for anorexia, weight loss, fever, chills, fatigue, weakness. ENT: Negative for hoarseness, difficulty swallowing , nasal congestion. CV: Negative for chest pain, angina, palpitations, dyspnea on exertion, peripheral edema.  Respiratory: Negative for dyspnea at rest, dyspnea on exertion, cough, sputum, wheezing.  GI: See history of present illness. GU:  Negative for dysuria, hematuria, urinary incontinence, urinary frequency, nocturnal urination.  Endo: Negative for unusual weight change.    Physical Examination:   BP 131/69 mmHg  Pulse 78  Temp(Src) 97.9 F (36.6 C) (Oral)  Wt 149 lb (67.586 kg)  General: Well-nourished, well-developed in no acute distress.  Eyes: No icterus. Conjunctivae pink. Mouth: Oropharyngeal mucosa moist and pink , no lesions erythema or exudate. Lungs: Clear to auscultation bilaterally. Non-labored. Heart: Regular rate and rhythm, no murmurs rubs or gallops.  Abdomen: Bowel sounds are normal, nontender, nondistended, no hepatosplenomegaly or masses, no abdominal bruits or hernia , no rebound or guarding.   Extremities: No lower extremity edema. No clubbing or deformities. Neuro: Alert and oriented x 3.  Grossly intact. Skin: Warm and dry, no jaundice.   Psych: Alert and cooperative, normal mood and affect.  Labs:    Imaging Studies: No results found.  Assessment and Plan:   Barbara Moss is a 59 y.o. y/o female who is being treated for autoimmune hepatitis although the biopsy was not definitive. The patient's platelets have come up to 149 when he started in the 60s prior to treatment. The patient's liver enzymes are be checked today. The patient has also been told to go down to 10 mg of prednisone. If her  symptoms do not improve the patient may need to be switched to 6 MP and if she continues to feel badly or not responsive medication she may need to be sent down to a tertiary care center for further treatment options.   Note: This dictation was prepared with Dragon dictation along with smaller phrase technology. Any transcriptional errors that result from this process are unintentional.

## 2015-07-17 ENCOUNTER — Telehealth: Payer: Self-pay | Admitting: Gastroenterology

## 2015-07-17 NOTE — Telephone Encounter (Signed)
Patient is calling asking about her lab results

## 2015-07-18 NOTE — Telephone Encounter (Signed)
LVM for pt to return my call.

## 2015-07-19 ENCOUNTER — Other Ambulatory Visit: Payer: Self-pay

## 2015-07-19 DIAGNOSIS — K754 Autoimmune hepatitis: Secondary | ICD-10-CM

## 2015-07-19 NOTE — Telephone Encounter (Signed)
Called pt to inform her the CBC is still fine. Pt will go Monday and have hepatic function and CBC done. [facility] do not do the LFT's last week.

## 2015-07-24 ENCOUNTER — Telehealth: Payer: Self-pay

## 2015-07-24 LAB — HEPATIC FUNCTION PANEL
ALBUMIN: 3.8 g/dL (ref 3.5–5.5)
ALT: 55 IU/L — ABNORMAL HIGH (ref 0–32)
AST: 74 IU/L — AB (ref 0–40)
Alkaline Phosphatase: 86 IU/L (ref 39–117)
BILIRUBIN TOTAL: 0.5 mg/dL (ref 0.0–1.2)
BILIRUBIN, DIRECT: 0.18 mg/dL (ref 0.00–0.40)
Total Protein: 6.6 g/dL (ref 6.0–8.5)

## 2015-07-24 NOTE — Telephone Encounter (Signed)
-----   Message from Lucilla Lame, MD sent at 07/24/2015  6:16 AM EDT ----- Let the patient know the medication is not working and her liver enzymes are going up. I would like to have her seen at [facility] liver department.

## 2015-07-24 NOTE — Telephone Encounter (Signed)
Pt notified of results and referral. Request has been sent to Angie to initiate. Pt aware [facility] will contact her to schedule appt.

## 2015-07-25 ENCOUNTER — Telehealth: Payer: Self-pay | Admitting: Gastroenterology

## 2015-07-25 NOTE — Telephone Encounter (Signed)
I have faxed clinic notes, labs, images and demographic/insurance to [facility] Hepatology--[phone]. I have spoke with the referral specialist within the department and she stated that once the Doctor reviews this clinic notes, they will then decide on what Doctor the patient should see and will contact the patient with appointment date and time. This could take 2-6 weeks for the appointment to be made.   Patient was referred for elevated liver enzymes despite taking Imuran 50 mg daily.

## 2015-07-30 ENCOUNTER — Telehealth: Payer: Self-pay | Admitting: Gastroenterology

## 2015-07-30 NOTE — Telephone Encounter (Signed)
Called pt and informed her we will continue our weekly CBC checks until she has her appt with [facility]. Pt understood and will go today as scheduled.

## 2015-07-30 NOTE — Telephone Encounter (Signed)
Patient called and said she was due for Labs, but she also mentioned she was being referred to [facility] so she didn't know does she need to come back here or wait till [facility] calls her. Please call patient about this and referral

## 2015-08-01 ENCOUNTER — Other Ambulatory Visit: Payer: Self-pay | Admitting: Obstetrics and Gynecology

## 2015-08-01 DIAGNOSIS — Z1231 Encounter for screening mammogram for malignant neoplasm of breast: Secondary | ICD-10-CM

## 2015-08-08 ENCOUNTER — Telehealth: Payer: Self-pay | Admitting: Gastroenterology

## 2015-08-08 NOTE — Telephone Encounter (Signed)
Pt notified of lab results and her referral is still being reviewed by physician.

## 2015-08-08 NOTE — Telephone Encounter (Signed)
Patient called about lab results and check on referral to [facility]

## 2015-08-09 ENCOUNTER — Ambulatory Visit
Admission: RE | Admit: 2015-08-09 | Discharge: 2015-08-09 | Disposition: A | Discharge status: Home / Self Care | Payer: BC Managed Care – PPO | Source: Ambulatory Visit | Attending: Obstetrics and Gynecology | Admitting: Obstetrics and Gynecology

## 2015-08-09 DIAGNOSIS — Z1231 Encounter for screening mammogram for malignant neoplasm of breast: Secondary | ICD-10-CM | POA: Diagnosis not present

## 2015-12-25 ENCOUNTER — Other Ambulatory Visit: Payer: Self-pay | Admitting: Gastroenterology

## 2016-01-01 NOTE — Telephone Encounter (Signed)
Patient called and stated the medication hasn't been called in yet.

## 2016-04-23 ENCOUNTER — Encounter: Payer: Self-pay | Admitting: *Deleted

## 2016-04-24 ENCOUNTER — Encounter: Payer: Self-pay | Admitting: Anesthesiology

## 2016-04-24 ENCOUNTER — Encounter
Admission: RE | Disposition: A | Discharge status: Home / Self Care | Payer: Self-pay | Source: Ambulatory Visit | Attending: Unknown Physician Specialty

## 2016-04-24 ENCOUNTER — Ambulatory Visit: Payer: BC Managed Care – PPO | Admitting: Anesthesiology

## 2016-04-24 ENCOUNTER — Ambulatory Visit
Admission: RE | Admit: 2016-04-24 | Discharge: 2016-04-24 | Disposition: A | Discharge status: Home / Self Care | Payer: BC Managed Care – PPO | Source: Ambulatory Visit | Attending: Unknown Physician Specialty | Admitting: Unknown Physician Specialty

## 2016-04-24 DIAGNOSIS — Z1211 Encounter for screening for malignant neoplasm of colon: Secondary | ICD-10-CM | POA: Insufficient documentation

## 2016-04-24 DIAGNOSIS — D125 Benign neoplasm of sigmoid colon: Secondary | ICD-10-CM | POA: Insufficient documentation

## 2016-04-24 DIAGNOSIS — D509 Iron deficiency anemia, unspecified: Secondary | ICD-10-CM | POA: Diagnosis not present

## 2016-04-24 DIAGNOSIS — K219 Gastro-esophageal reflux disease without esophagitis: Secondary | ICD-10-CM | POA: Insufficient documentation

## 2016-04-24 DIAGNOSIS — Z8619 Personal history of other infectious and parasitic diseases: Secondary | ICD-10-CM | POA: Insufficient documentation

## 2016-04-24 DIAGNOSIS — G4730 Sleep apnea, unspecified: Secondary | ICD-10-CM | POA: Insufficient documentation

## 2016-04-24 DIAGNOSIS — Z87891 Personal history of nicotine dependence: Secondary | ICD-10-CM | POA: Diagnosis not present

## 2016-04-24 DIAGNOSIS — Z79899 Other long term (current) drug therapy: Secondary | ICD-10-CM | POA: Diagnosis not present

## 2016-04-24 DIAGNOSIS — K640 First degree hemorrhoids: Secondary | ICD-10-CM | POA: Insufficient documentation

## 2016-04-24 DIAGNOSIS — Z8614 Personal history of Methicillin resistant Staphylococcus aureus infection: Secondary | ICD-10-CM | POA: Insufficient documentation

## 2016-04-24 DIAGNOSIS — K621 Rectal polyp: Secondary | ICD-10-CM | POA: Insufficient documentation

## 2016-04-24 DIAGNOSIS — Z809 Family history of malignant neoplasm, unspecified: Secondary | ICD-10-CM | POA: Insufficient documentation

## 2016-04-24 DIAGNOSIS — E039 Hypothyroidism, unspecified: Secondary | ICD-10-CM | POA: Diagnosis not present

## 2016-04-24 DIAGNOSIS — Z8349 Family history of other endocrine, nutritional and metabolic diseases: Secondary | ICD-10-CM | POA: Insufficient documentation

## 2016-04-24 DIAGNOSIS — Z800 Family history of malignant neoplasm of digestive organs: Secondary | ICD-10-CM | POA: Diagnosis not present

## 2016-04-24 DIAGNOSIS — Z90710 Acquired absence of both cervix and uterus: Secondary | ICD-10-CM | POA: Insufficient documentation

## 2016-04-24 DIAGNOSIS — K7460 Unspecified cirrhosis of liver: Secondary | ICD-10-CM | POA: Insufficient documentation

## 2016-04-24 DIAGNOSIS — M1990 Unspecified osteoarthritis, unspecified site: Secondary | ICD-10-CM | POA: Insufficient documentation

## 2016-04-24 DIAGNOSIS — K648 Other hemorrhoids: Secondary | ICD-10-CM | POA: Insufficient documentation

## 2016-04-24 DIAGNOSIS — Z8249 Family history of ischemic heart disease and other diseases of the circulatory system: Secondary | ICD-10-CM | POA: Diagnosis not present

## 2016-04-24 DIAGNOSIS — M069 Rheumatoid arthritis, unspecified: Secondary | ICD-10-CM | POA: Insufficient documentation

## 2016-04-24 DIAGNOSIS — Z8261 Family history of arthritis: Secondary | ICD-10-CM | POA: Insufficient documentation

## 2016-04-24 HISTORY — PX: COLONOSCOPY WITH PROPOFOL: SHX5780

## 2016-04-24 LAB — CBC
HEMATOCRIT: 40.8 % (ref 35.0–47.0)
HEMOGLOBIN: 14.4 g/dL (ref 12.0–16.0)
MCH: 32.7 pg (ref 26.0–34.0)
MCHC: 35.4 g/dL (ref 32.0–36.0)
MCV: 92.5 fL (ref 80.0–100.0)
Platelets: 133 10*3/uL — ABNORMAL LOW (ref 150–440)
RBC: 4.41 MIL/uL (ref 3.80–5.20)
RDW: 14.4 % (ref 11.5–14.5)
WBC: 5.2 10*3/uL (ref 3.6–11.0)

## 2016-04-24 LAB — PROTIME-INR
INR: 1.27
Prothrombin Time: 16 seconds — ABNORMAL HIGH (ref 11.4–15.0)

## 2016-04-24 SURGERY — COLONOSCOPY WITH PROPOFOL
Anesthesia: General

## 2016-04-24 MED ORDER — EPHEDRINE SULFATE 50 MG/ML IJ SOLN
INTRAMUSCULAR | Status: DC | PRN
  Administered 2016-04-24 (×2): 5 mg via INTRAVENOUS

## 2016-04-24 MED ORDER — SODIUM CHLORIDE 0.9 % IV SOLN
INTRAVENOUS | Status: DC
Start: 2016-04-24 — End: 2016-04-26

## 2016-04-24 MED ORDER — PROPOFOL 500 MG/50ML IV EMUL
INTRAVENOUS | Status: DC | PRN
  Administered 2016-04-24: 100 ug/kg/min via INTRAVENOUS

## 2016-04-24 MED ORDER — SODIUM CHLORIDE 0.9 % IV SOLN
INTRAVENOUS | Status: DC
  Administered 2016-04-24: 1000 mL via INTRAVENOUS

## 2016-04-24 MED ORDER — PHENYLEPHRINE HCL 10 MG/ML IJ SOLN
INTRAMUSCULAR | Status: DC | PRN
  Administered 2016-04-24: 50 ug via INTRAVENOUS

## 2016-04-24 MED ORDER — MIDAZOLAM HCL 2 MG/2ML IJ SOLN
INTRAMUSCULAR | Status: DC | PRN
Start: 2016-04-24 — End: 2016-04-24
  Administered 2016-04-24: 1 mg via INTRAVENOUS

## 2016-04-24 MED ORDER — FENTANYL CITRATE (PF) 100 MCG/2ML IJ SOLN
INTRAMUSCULAR | Status: DC | PRN
  Administered 2016-04-24: 50 ug via INTRAVENOUS

## 2016-04-24 NOTE — Transfer of Care (Signed)
Immediate Anesthesia Transfer of Care Note  Patient: Barbara Moss  Procedure(s) Performed: Procedure(s): COLONOSCOPY WITH PROPOFOL (N/A)  Patient Location: PACU  Anesthesia Type:General  Level of Consciousness: awake and sedated  Airway & Oxygen Therapy: Patient Spontanous Breathing and Patient connected to nasal cannula oxygen  Post-op Assessment: Report given to RN and Post -op Vital signs reviewed and stable  Post vital signs: Reviewed and stable  Last Vitals:  Filed Vitals:   04/24/16 1220 04/24/16 1359  BP: 121/63 100/43  Pulse: 72 84  Temp: 37.6 C 36.3 C  Resp: 20 20    Last Pain: There were no vitals filed for this visit.       Complications: No apparent anesthesia complications

## 2016-04-24 NOTE — H&P (Signed)
Primary Care Physician:  Emmaline Kluver, MD Primary Gastroenterologist:  Dr. Vira Agar  Pre-Procedure History & Physical: HPI:  Barbara Moss is a 60 y.o. female is here for an colonoscopy.   Past Medical History  Diagnosis Date  . Hypothyroidism   . GERD (gastroesophageal reflux disease)   . Arthritis   . Anemia   . Hearing decreased   . Sleep apnea 03/19/2015  . Cirrhosis, non-alcoholic ([place]) [id number]  . Hand discomfort 12/20/2014  . Iron deficiency anemia 03/19/2015  . Abnormal liver enzymes 03/19/2015  . History of MRSA infection     left thigh  . Autoimmune hepatitis ([place]) 04/23/2015    Past Surgical History  Procedure Laterality Date  . Abdominal hysterectomy    . Inner ear surgery Bilateral 03/19/2012  . Nasal sinus surgery    . Bladder tack    . Salpingoophorectomy    . Tympanoplasty with mastoidectomy      Prior to Admission medications   Medication Sig Start Date End Date Taking? Authorizing Provider  Golimumab 50 MG/0.5ML SOAJ Inject 50 mLs into the skin.   Yes Historical Provider, MD  hydrochlorothiazide (HYDRODIURIL) 25 MG tablet Take 25 mg by mouth daily.   Yes Historical Provider, MD  levothyroxine (SYNTHROID, LEVOTHROID) 88 MCG tablet Take 88 mcg by mouth daily before breakfast.   Yes Historical Provider, MD  omeprazole (PRILOSEC) 40 MG capsule TAKE 1 CAPSULE BY MOUTH ONCE DAILY FOR REFLUX. 01/01/16  Yes Lucilla Lame, MD  azaTHIOprine (IMURAN) 50 MG tablet Take 1 tablet (50 mg total) by mouth daily. Patient not taking: Reported on 04/24/2016 06/12/15   Lucilla Lame, MD  B COMPLEX VITAMINS ER PO Take by mouth daily.    Historical Provider, MD  calcium-vitamin D (OSCAL WITH D) 500-200 MG-UNIT per tablet Take 1 tablet by mouth.    Historical Provider, MD  estropipate (OGEN) 0.75 MG tablet Take 0.75 mg by mouth daily.    Historical Provider, MD  ESTROPIPATE PO Take by mouth daily.    Historical Provider, MD  ferrous fumarate (HEMOCYTE - 106 MG FE) 325 (106 FE) MG  TABS tablet Take 1 tablet by mouth.    Historical Provider, MD  folic acid (FOLVITE) 1 MG tablet Take 2 mg by mouth daily.    Historical Provider, MD  levothyroxine (SYNTHROID, LEVOTHROID) 100 MCG tablet Take 100 mcg by mouth daily.    Historical Provider, MD  levothyroxine (SYNTHROID, LEVOTHROID) 75 MCG tablet Take 75 mcg by mouth daily before breakfast.    Historical Provider, MD  predniSONE (DELTASONE) 10 MG tablet Take 6 tablets (60 mg total) by mouth daily with breakfast. 60mg /day for 1 week then 40mg /day for a week then 30mg /day for 2 weeks then 20 mg a day. 03/21/15   Lucilla Lame, MD  traMADol (ULTRAM) 50 MG tablet Take by mouth. 04/25/15   Historical Provider, MD    Allergies as of 03/31/2016  . (No Known Allergies)    Family History  Problem Relation Age of Onset  . Heart disease Mother   . Heart disease Father   . Arthritis Sister   . Thyroid disease Mother   . Cancer Sister   . Heart disease Maternal Grandmother   . Heart disease Maternal Grandfather   . Colon cancer Maternal Aunt     Social History   Social History  . Marital Status: Married    Spouse Name: N/A  . Number of Children: N/A  . Years of Education: N/A   Occupational  History  . Not on file.   Social History Main Topics  . Smoking status: Former Research scientist (life sciences)  . Smokeless tobacco: Never Used  . Alcohol Use: 0.0 oz/week    0 Standard drinks or equivalent per week     Comment: occasioanl  . Drug Use: No  . Sexual Activity: Not on file   Other Topics Concern  . Not on file   Social History Narrative    Review of Systems: See HPI, otherwise negative ROS  Physical Exam: BP 121/63 mmHg  Pulse 72  Temp(Src) 99.6 F (37.6 C) (Tympanic)  Resp 20  Ht 4' 11.5" (1.511 m)  Wt 67.132 kg (148 lb)  BMI 29.40 kg/m2  SpO2 98% General:   Alert,  pleasant and cooperative in NAD Head:  Normocephalic and atraumatic. Neck:  Supple; no masses or thyromegaly. Lungs:  Clear throughout to auscultation.    Heart:   Regular rate and rhythm. Abdomen:  Soft, nontender and nondistended. Normal bowel sounds, without guarding, and without rebound.   Neurologic:  Alert and  oriented x4;  grossly normal neurologically.  Impression/Plan: Barbara Moss is here for an colonoscopy to be performed for screening  Risks, benefits, limitations, and alternatives regarding  colonoscopy have been reviewed with the patient.  Questions have been answered.  All parties agreeable.   Gaylyn Cheers, MD  04/24/2016, 1:32 PM

## 2016-04-24 NOTE — Anesthesia Procedure Notes (Signed)
Performed by: COOK-MARTIN, Herson Prichard Pre-anesthesia Checklist: Patient identified, Emergency Drugs available, Suction available, Patient being monitored and Timeout performed Patient Re-evaluated:Patient Re-evaluated prior to inductionOxygen Delivery Method: Nasal cannula Preoxygenation: Pre-oxygenation with 100% oxygen Intubation Type: IV induction Ventilation: Oral airway inserted - appropriate to patient size Placement Confirmation: positive ETCO2 and CO2 detector     

## 2016-04-24 NOTE — Op Note (Signed)
[facility] Gastroenterology Patient Name: Barbara Moss Procedure Date: 04/24/2016 1:35 PM MRN: [id number] Account #: [id number] Date of Birth: 06-07-1956 Admit Type: Outpatient Age: 60 Room: [facility] ENDO ROOM 4 Gender: Female Note Status: Finalized Procedure:            Colonoscopy Indications:          Screening for colorectal malignant neoplasm Providers:            Manya Silvas, MD Referring MD:         Glendon Axe (Referring MD) Medicines:            Propofol per Anesthesia Complications:        No immediate complications. Procedure:            Pre-Anesthesia Assessment:                       - After reviewing the risks and benefits, the patient                        was deemed in satisfactory condition to undergo the                        procedure.                       After obtaining informed consent, the colonoscope was                        passed under direct vision. Throughout the procedure,                        the patient's blood pressure, pulse, and oxygen                        saturations were monitored continuously. The                        Colonoscope was introduced through the anus and                        advanced to the the cecum, identified by appendiceal                        orifice and ileocecal valve. The colonoscopy was                        performed without difficulty. The patient tolerated the                        procedure well. The quality of the bowel preparation                        was excellent. Findings:      A small polyp was found in the sigmoid colon. The polyp was sessile. The       polyp was removed with a hot snare. Resection and retrieval were       complete.      A diminutive polyp was found in the rectum. The polyp was sessile. The       polyp was removed with a jumbo cold forceps. Resection and retrieval  were complete.      Internal hemorrhoids were found during endoscopy. The  hemorrhoids were       small and Grade I (internal hemorrhoids that do not prolapse). Impression:           - One small polyp in the sigmoid colon, removed with a                        hot snare. Resected and retrieved.                       - One diminutive polyp in the rectum, removed with a                        jumbo cold forceps. Resected and retrieved.                       - Internal hemorrhoids. Recommendation:       - Await pathology results. Manya Silvas, MD 04/24/2016 1:58:18 PM This report has been signed electronically. Number of Addenda: 0 Note Initiated On: 04/24/2016 1:35 PM Scope Withdrawal Time: 0 hours 9 minutes 24 seconds  Total Procedure Duration: 0 hours 16 minutes 45 seconds       [facility]

## 2016-04-24 NOTE — Anesthesia Preprocedure Evaluation (Signed)
Anesthesia Evaluation  Patient identified by MRN, date of birth, ID band Patient awake    Reviewed: Allergy & Precautions, NPO status , Patient's Chart, lab work & pertinent test results, reviewed documented beta blocker date and time   Airway Mallampati: II  TM Distance: >3 FB     Dental  (+) Chipped   Pulmonary sleep apnea , former smoker,           Cardiovascular      Neuro/Psych    GI/Hepatic GERD  ,(+) Hepatitis -  Endo/Other  Hypothyroidism   Renal/GU      Musculoskeletal  (+) Arthritis , Rheumatoid disorders,    Abdominal   Peds  Hematology  (+) anemia ,   Anesthesia Other Findings   Reproductive/Obstetrics                             Anesthesia Physical Anesthesia Plan  ASA: II  Anesthesia Plan: General   Post-op Pain Management:    Induction: Intravenous  Airway Management Planned: Nasal Cannula  Additional Equipment:   Intra-op Plan:   Post-operative Plan:   Informed Consent: I have reviewed the patients History and Physical, chart, labs and discussed the procedure including the risks, benefits and alternatives for the proposed anesthesia with the patient or authorized representative who has indicated his/her understanding and acceptance.     Plan Discussed with: CRNA  Anesthesia Plan Comments:         Anesthesia Quick Evaluation

## 2016-04-24 NOTE — Anesthesia Postprocedure Evaluation (Signed)
Anesthesia Post Note  Patient: Barbara Moss  Procedure(s) Performed: Procedure(s) (LRB): COLONOSCOPY WITH PROPOFOL (N/A)  Patient location during evaluation: Endoscopy Anesthesia Type: General Level of consciousness: awake and alert Pain management: pain level controlled Vital Signs Assessment: post-procedure vital signs reviewed and stable Respiratory status: spontaneous breathing, nonlabored ventilation, respiratory function stable and patient connected to nasal cannula oxygen Cardiovascular status: blood pressure returned to baseline and stable Postop Assessment: no signs of nausea or vomiting Anesthetic complications: no    Last Vitals:  Filed Vitals:   04/24/16 1419 04/24/16 1429  BP: 101/49 102/61  Pulse: 88 77  Temp:    Resp: 17 16    Last Pain: There were no vitals filed for this visit.               Cherina Dhillon S

## 2016-04-25 LAB — SURGICAL PATHOLOGY

## 2016-04-27 ENCOUNTER — Encounter: Payer: Self-pay | Admitting: Unknown Physician Specialty

## 2016-07-04 ENCOUNTER — Other Ambulatory Visit: Payer: Self-pay | Admitting: Gastroenterology

## 2016-07-04 DIAGNOSIS — K7460 Unspecified cirrhosis of liver: Secondary | ICD-10-CM

## 2016-08-14 ENCOUNTER — Other Ambulatory Visit: Payer: Self-pay | Admitting: Obstetrics and Gynecology

## 2016-08-14 DIAGNOSIS — Z1231 Encounter for screening mammogram for malignant neoplasm of breast: Secondary | ICD-10-CM

## 2016-09-17 ENCOUNTER — Ambulatory Visit
Admission: RE | Admit: 2016-09-17 | Discharge: 2016-09-17 | Disposition: A | Discharge status: Home / Self Care | Payer: BC Managed Care – PPO | Source: Ambulatory Visit | Attending: Obstetrics and Gynecology | Admitting: Obstetrics and Gynecology

## 2016-09-17 DIAGNOSIS — Z1231 Encounter for screening mammogram for malignant neoplasm of breast: Secondary | ICD-10-CM | POA: Insufficient documentation

## 2017-01-07 ENCOUNTER — Other Ambulatory Visit: Payer: Self-pay | Admitting: Gastroenterology

## 2017-01-16 NOTE — Telephone Encounter (Signed)
Patient called and needs a refill on omeprazole [facility] [phone] in [place] She needs today

## 2017-02-16 ENCOUNTER — Other Ambulatory Visit: Payer: Self-pay | Admitting: Rheumatology

## 2017-02-16 DIAGNOSIS — M542 Cervicalgia: Secondary | ICD-10-CM

## 2017-02-18 ENCOUNTER — Ambulatory Visit
Admission: RE | Admit: 2017-02-18 | Discharge: 2017-02-18 | Disposition: A | Discharge status: Home / Self Care | Payer: BC Managed Care – PPO | Source: Ambulatory Visit | Attending: Rheumatology | Admitting: Rheumatology

## 2017-02-18 DIAGNOSIS — M2578 Osteophyte, vertebrae: Secondary | ICD-10-CM | POA: Insufficient documentation

## 2017-02-18 DIAGNOSIS — M4802 Spinal stenosis, cervical region: Secondary | ICD-10-CM | POA: Insufficient documentation

## 2017-02-18 DIAGNOSIS — M542 Cervicalgia: Secondary | ICD-10-CM | POA: Diagnosis present

## 2017-02-18 DIAGNOSIS — M5021 Other cervical disc displacement,  high cervical region: Secondary | ICD-10-CM | POA: Diagnosis not present

## 2017-02-26 ENCOUNTER — Ambulatory Visit: Payer: BC Managed Care – PPO

## 2017-04-30 ENCOUNTER — Other Ambulatory Visit: Payer: Self-pay | Admitting: Physical Medicine & Rehabilitation

## 2017-04-30 ENCOUNTER — Ambulatory Visit
Admission: RE | Admit: 2017-04-30 | Discharge: 2017-04-30 | Disposition: A | Discharge status: Home / Self Care | Payer: BC Managed Care – PPO | Source: Ambulatory Visit | Attending: Physical Medicine & Rehabilitation | Admitting: Physical Medicine & Rehabilitation

## 2017-04-30 DIAGNOSIS — R2242 Localized swelling, mass and lump, left lower limb: Secondary | ICD-10-CM | POA: Insufficient documentation

## 2017-05-04 ENCOUNTER — Emergency Department
Admission: EM | Admit: 2017-05-04 | Discharge: 2017-05-05 | Disposition: A | Discharge status: Home / Self Care | Payer: BC Managed Care – PPO | Attending: Emergency Medicine | Admitting: Emergency Medicine

## 2017-05-04 DIAGNOSIS — K7460 Unspecified cirrhosis of liver: Secondary | ICD-10-CM | POA: Diagnosis not present

## 2017-05-04 DIAGNOSIS — R112 Nausea with vomiting, unspecified: Secondary | ICD-10-CM | POA: Insufficient documentation

## 2017-05-04 DIAGNOSIS — E039 Hypothyroidism, unspecified: Secondary | ICD-10-CM | POA: Insufficient documentation

## 2017-05-04 DIAGNOSIS — K529 Noninfective gastroenteritis and colitis, unspecified: Secondary | ICD-10-CM | POA: Diagnosis not present

## 2017-05-04 DIAGNOSIS — K754 Autoimmune hepatitis: Secondary | ICD-10-CM | POA: Insufficient documentation

## 2017-05-04 DIAGNOSIS — Z79899 Other long term (current) drug therapy: Secondary | ICD-10-CM | POA: Diagnosis not present

## 2017-05-04 DIAGNOSIS — R109 Unspecified abdominal pain: Secondary | ICD-10-CM | POA: Diagnosis present

## 2017-05-04 DIAGNOSIS — Z87891 Personal history of nicotine dependence: Secondary | ICD-10-CM | POA: Diagnosis not present

## 2017-05-04 LAB — COMPREHENSIVE METABOLIC PANEL
ALBUMIN: 2.8 g/dL — AB (ref 3.5–5.0)
ALK PHOS: 80 U/L (ref 38–126)
ALT: 38 U/L (ref 14–54)
AST: 61 U/L — AB (ref 15–41)
Anion gap: 6 (ref 5–15)
BUN: 9 mg/dL (ref 6–20)
CALCIUM: 8.2 mg/dL — AB (ref 8.9–10.3)
CHLORIDE: 101 mmol/L (ref 101–111)
CO2: 29 mmol/L (ref 22–32)
CREATININE: 0.67 mg/dL (ref 0.44–1.00)
GFR calc Af Amer: >60 mL/min (ref >60)
GFR calc non Af Amer: >60 mL/min (ref >60)
GLUCOSE: 87 mg/dL (ref 65–99)
Potassium: 2.9 mmol/L — ABNORMAL LOW (ref 3.5–5.1)
SODIUM: 136 mmol/L (ref 135–145)
Total Bilirubin: 3.8 mg/dL — ABNORMAL HIGH (ref 0.3–1.2)
Total Protein: 6.4 g/dL — ABNORMAL LOW (ref 6.5–8.1)

## 2017-05-04 LAB — URINALYSIS, COMPLETE (UACMP) WITH MICROSCOPIC
Bilirubin Urine: NEGATIVE
GLUCOSE, UA: NEGATIVE mg/dL
Hgb urine dipstick: NEGATIVE
KETONES UR: NEGATIVE mg/dL
Nitrite: NEGATIVE
PH: 7 (ref 5.0–8.0)
PROTEIN: NEGATIVE mg/dL
Specific Gravity, Urine: 1.012 (ref 1.005–1.030)

## 2017-05-04 LAB — CBC
HCT: 35.3 % (ref 35.0–47.0)
Hemoglobin: 12.5 g/dL (ref 12.0–16.0)
MCH: 40.8 pg — AB (ref 26.0–34.0)
MCHC: 35.4 g/dL (ref 32.0–36.0)
MCV: 115.3 fL — AB (ref 80.0–100.0)
PLATELETS: 102 10*3/uL — AB (ref 150–440)
RBC: 3.06 MIL/uL — ABNORMAL LOW (ref 3.80–5.20)
RDW: 17.7 % — ABNORMAL HIGH (ref 11.5–14.5)
WBC: 2.5 10*3/uL — ABNORMAL LOW (ref 3.6–11.0)

## 2017-05-04 LAB — LIPASE, BLOOD: Lipase: 52 U/L — ABNORMAL HIGH (ref 11–51)

## 2017-05-04 MED ORDER — SODIUM CHLORIDE 0.9 % IV BOLUS (SEPSIS)
500.0000 mL | Freq: Once | INTRAVENOUS | Status: AC
  Administered 2017-05-04: 500 mL via INTRAVENOUS

## 2017-05-04 MED ORDER — MORPHINE SULFATE (PF) 4 MG/ML IV SOLN
4.0000 mg | Freq: Once | INTRAVENOUS | Status: AC
  Administered 2017-05-04: 4 mg via INTRAVENOUS
  Filled 2017-05-04: qty 1

## 2017-05-04 MED ORDER — ONDANSETRON HCL 4 MG/2ML IJ SOLN
4.0000 mg | Freq: Once | INTRAMUSCULAR | Status: AC
  Administered 2017-05-04: 4 mg via INTRAVENOUS
  Filled 2017-05-04: qty 2

## 2017-05-04 NOTE — ED Notes (Signed)
Urine was collected in triage

## 2017-05-04 NOTE — ED Triage Notes (Signed)
Pt arrives to ED from home via POV with c/o RUQ abdominal pain x2 days. Pt reports h/x of gall stones and liver disease. Pt denies diarrhea, (+) N/V with 2-3 episodes of emesis in last 24hrs. Pt reports abdominal pain is generalized, but most intense in the RUQ. Pt is A&O, in NAD, RR even, regular, and unlabored.

## 2017-05-04 NOTE — ED Provider Notes (Signed)
[facility] Emergency Department Provider Note   ____________________________________________   First MD Initiated Contact with Patient 05/04/17 2310     (approximate)  I have reviewed the triage vital signs and the nursing notes.   HISTORY  Chief Complaint Abdominal Pain; Emesis; and Nausea    HPI Barbara Moss is a 61 y.o. female who comes into the hospital today with abdominal pain. The patient reports that she started having some sharp epigastric pain yesterday. She reports that she felt nauseous all day long. She was having shooting pains from her epigastric area all the way down her abdomen. The patient reports that she had it several times yesterday and then it persisted into today. The patient did vomit this morning and hasn't really been able to eat. She's also felt sore in her right upper quadrant. The patient has a history of gallstones many years ago as well as cirrhosis and autoimmune hepatitis. The patient denies any fevers or diarrhea but she's had some chills. The patient rates her pain 8 out of 10 in intensity. She has not taken anything for pain.   Past Medical History:  Diagnosis Date  . Abnormal liver enzymes 03/19/2015  . Anemia   . Arthritis   . Autoimmune hepatitis ([place]) 04/23/2015  . 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 GERD (gastroesophageal reflux disease)   . Hand discomfort 12/20/2014  . Hearing decreased   . History of MRSA infection    left thigh  . Hypothyroidism   . Iron deficiency anemia 03/19/2015  . Sleep apnea 03/19/2015    Patient Active Problem List   Diagnosis Date Noted  . Calculus of gallbladder 04/23/2015  . Autoimmune hepatitis ([place]) 04/23/2015  . Allergic state 03/19/2015  . Abnormal liver enzymes 03/19/2015  . Acid reflux 03/19/2015  . Iron deficiency anemia 03/19/2015  . Rheumatoid arthritis with rheumatoid factor ([place]) 03/19/2015  . Sleep apnea 03/19/2015  . Thyroid disease 03/19/2015  .  Cirrhosis, non-alcoholic ([place]) 63/89/3734  . Hand discomfort 12/20/2014  . Conductive hearing loss of both ears 05/02/2014  . Adult hypothyroidism 01/27/2013    Past Surgical History:  Procedure Laterality Date  . ABDOMINAL HYSTERECTOMY    . Bladder tack    . COLONOSCOPY WITH PROPOFOL N/A 04/24/2016   Procedure: COLONOSCOPY WITH PROPOFOL;  Surgeon: Manya Silvas, MD;  Location: [facility] ENDOSCOPY;  Service: Endoscopy;  Laterality: N/A;  . INNER EAR SURGERY Bilateral 03/19/2012  . NASAL SINUS SURGERY    . SALPINGOOPHORECTOMY    . Tympanoplasty with mastoidectomy      Prior to Admission medications   Medication Sig Start Date End Date Taking? Authorizing Provider  azaTHIOprine (IMURAN) 50 MG tablet Take 1 tablet (50 mg total) by mouth daily. Patient not taking: Reported on 04/24/2016 06/12/15   Lucilla Lame, MD  B COMPLEX VITAMINS ER PO Take by mouth daily.    [provider]  calcium-vitamin D (OSCAL WITH D) 500-200 MG-UNIT per tablet Take 1 tablet by mouth.    [provider]  ciprofloxacin (CIPRO) 500 MG tablet Take 1 tablet (500 mg total) by mouth 2 (two) times daily. 05/05/17 05/12/17  Loney Hering, MD  estropipate (OGEN) 0.75 MG tablet Take 0.75 mg by mouth daily.    [provider]  ESTROPIPATE PO Take by mouth daily.    [provider]  ferrous fumarate (HEMOCYTE - 106 MG FE) 325 (106 FE) MG TABS tablet Take 1 tablet by mouth.    [provider]  folic acid (FOLVITE) 1 MG tablet Take 2 mg by mouth daily.    [provider]  Golimumab 50 MG/0.5ML SOAJ Inject 50 mLs into the skin.    [provider]  hydrochlorothiazide (HYDRODIURIL) 25 MG tablet Take 25 mg by mouth daily.    [provider]  levothyroxine (SYNTHROID, LEVOTHROID) 100 MCG tablet Take 100 mcg by mouth daily.    [provider]  levothyroxine (SYNTHROID, LEVOTHROID) 75 MCG tablet Take 75 mcg by mouth daily before breakfast.    [provider]  levothyroxine (SYNTHROID, LEVOTHROID) 88 MCG tablet Take 88 mcg by mouth daily before breakfast.    [provider]  metroNIDAZOLE (FLAGYL) 500 MG tablet Take 1 tablet (500 mg total) by mouth 2 (two) times daily. 05/05/17 05/12/17  Loney Hering, MD  omeprazole (PRILOSEC) 40 MG capsule TAKE 1 CAPSULE BY MOUTH ONCE DAILY FOR REFLUX. 01/16/17   Lucilla Lame, MD  ondansetron (ZOFRAN ODT) 4 MG disintegrating tablet Take 1 tablet (4 mg total) by mouth every 8 (eight) hours as needed for nausea or vomiting. 05/05/17   Loney Hering, MD  predniSONE (DELTASONE) 10 MG tablet Take 6 tablets (60 mg total) by mouth daily with breakfast. 60mg /day for 1 week then 40mg /day for a week then 30mg /day for 2 weeks then 20 mg a day. 03/21/15   Lucilla Lame, MD  traMADol (ULTRAM) 50 MG tablet Take by mouth. 04/25/15   [provider]    Allergies Patient has no known allergies.  Family History  Problem Relation Age of Onset  . Heart disease Father   . Arthritis Sister   . Heart disease Mother   . Thyroid disease Mother   . Cancer Sister   . Heart disease Maternal Grandmother   . Heart disease Maternal Grandfather   . Colon cancer Maternal Aunt   . Breast cancer Neg Hx     Social History Social History  Substance Use Topics  . Smoking status: Former Research scientist (life sciences)  . Smokeless tobacco: Never Used  . Alcohol use 0.0 oz/week     Comment: occasioanl    Review of Systems  Constitutional: No fever/chills Eyes: No visual changes. ENT: No sore throat. Cardiovascular: Denies chest pain. Respiratory: Denies shortness of breath. Gastrointestinal: abdominal pain.   nausea,  vomiting.  No diarrhea.  No constipation. Genitourinary: Negative for dysuria. Musculoskeletal: Negative for back pain. Skin: Negative for rash. Neurological: Negative for headaches, focal weakness or numbness.   ____________________________________________   PHYSICAL EXAM:  VITAL SIGNS: ED Triage  Vitals  Enc Vitals Group     BP 05/04/17 2042 132/72     Pulse 05/04/17 2042 95     Resp 05/04/17 2042 16     Temp 05/04/17 2042 98.6     Temp src --      SpO2 05/04/17 2042 100%     Weight 05/04/17 2041 145 lb (65.8 kg)     Height 05/04/17 2041 5' (1.524 m)     Head Circumference --      Peak Flow --      Pain Score 05/04/17 2040 8     Pain Loc --      Pain Edu? --      Excl. in [place]? --     Constitutional: Alert and oriented. Well appearing and in moderate distress. Eyes: Conjunctivae are normal. PERRL. EOMI. Head: Atraumatic. Nose: No congestion/rhinnorhea. Mouth/Throat: Mucous membranes are moist.  Oropharynx non-erythematous. Cardiovascular: Normal rate, regular rhythm. Grossly normal heart sounds.  Good peripheral circulation. Respiratory: Normal respiratory effort.  No retractions. Lungs CTAB. Gastrointestinal: Soft with some epigastric and RUQ pain to palpation. No distention. Positive bowels sounds Musculoskeletal: No lower extremity tenderness nor edema.   Neurologic:  Normal speech and language.  Skin:  Skin is warm, dry and intact.  Psychiatric: Mood and affect are normal.   ____________________________________________   LABS (all labs ordered are listed, but only abnormal results are displayed)  Labs Reviewed  LIPASE, BLOOD - Abnormal; Notable for the following:       Result Value   Lipase 52 (*)    All other components within normal limits  COMPREHENSIVE METABOLIC PANEL - Abnormal; Notable for the following:    Potassium 2.9 (*)    Calcium 8.2 (*)    Total Protein 6.4 (*)    Albumin 2.8 (*)    AST 61 (*)    Total Bilirubin 3.8 (*)    All other components within normal limits  CBC - Abnormal; Notable for the following:    WBC 2.5 (*)    RBC 3.06 (*)    MCV 115.3 (*)    MCH 40.8 (*)    RDW 17.7 (*)    Platelets 102 (*)    All other components within normal limits  URINALYSIS, COMPLETE (UACMP) WITH MICROSCOPIC - Abnormal; Notable for the following:     Color, Urine AMBER (*)    APPearance CLEAR (*)    Leukocytes, UA MODERATE (*)    Bacteria, UA RARE (*)    Squamous Epithelial / LPF 0-5 (*)    All other components within normal limits  TROPONIN I   ____________________________________________  EKG  ED ECG REPORT I, Loney Hering, the attending physician, personally viewed and interpreted this ECG.   Date: 05/04/2017  EKG Time: 2348  Rate: 91  Rhythm: normal sinus rhythm  Axis: normal  Intervals:prolonged qtc  ST&T Change: none  ____________________________________________  RADIOLOGY  Ct Abdomen Pelvis W Contrast  Result Date: 05/05/2017 CLINICAL DATA:  Abdominal pain. EXAM: CT ABDOMEN AND PELVIS WITH CONTRAST TECHNIQUE: Multidetector CT imaging of the abdomen and pelvis was performed using the standard protocol following bolus administration of intravenous contrast. CONTRAST:  100 cc Isovue 300 IV COMPARISON:  Ultrasound earlier this day. FINDINGS: Lower chest: Mild atelectasis in both lung bases.  No pleural fluid. Hepatobiliary: The liver is enlarged with diffusely decreased density. Nodular hepatic contours. No focal hepatic lesion. Gallbladder is distended containing intraluminal gallstones. No biliary dilatation. Pancreas: No ductal dilatation or inflammation. Spleen: Splenomegaly, spleen measures 14.6 x 12.3 x 5.4 cm (volume = 510 cm^3). Adrenals/Urinary Tract: Normal adrenal glands. Homogeneous renal enhancement with symmetric excretion on delayed phase imaging. No hydronephrosis or perinephric edema. Urinary bladder is physiologically distended without wall thickening. Stomach/Bowel: Stomach is physiologically distended. There is a moderate length segment of bowel wall thickening involving the proximal jejunum in the left abdomen. No obstruction, enteric contrast reaches the mid small bowel. More distal small bowel is decompressed. No colonic wall thickening or inflammation. Normal appendix. Small to moderate colonic stool  burden. Vascular/Lymphatic: Atherosclerosis of the abdominal aorta. No aneurysm. Portal vein is patent. No abdominal or pelvic adenopathy. Reproductive: Status post hysterectomy. No adnexal masses. Other: Moderate volume of abdominopelvic ascites. Greatest volume in the right upper quadrant and pelvis. No free air. No loculated abscess. There is mild mesenteric edema and minimal free fluid in the mesenteric. Mild body wall edema. Musculoskeletal: Moderate to severe compression fracture of T12 with mild focal  kyphosis at T11-T12, likely chronic. Minimal compression fracture of superior endplate of L2. Degenerative change in the lower lumbar spine. No focal osseous lesion. IMPRESSION: 1. Cirrhotic hepatic morphology. Moderate volume of intra-abdominal/pelvic ascites. 2. Portal hypertension with splenomegaly. 3. Moderate length segment small bowel thickening and inflammation involving proximal jejunum in the left abdomen consistent with enteritis. This may be infectious, inflammatory, or less likely secondary to portal enteropathy. 4. Cholelithiasis. 5.  Aortic Atherosclerosis (ICD10-I70.0). Electronically Signed   By: Jeb Levering M.D.   On: 05/05/2017 02:08   US Abdomen Limited Ruq  Result Date: 05/05/2017 CLINICAL DATA:  Right upper quadrant pain for 2 days. EXAM: ULTRASOUND ABDOMEN LIMITED RIGHT UPPER QUADRANT COMPARISON:  01/25/2015 FINDINGS: Gallbladder: Multiple calculi, measuring up to 1.3 cm. The patient was not tender to probe pressure over the gallbladder. The gallbladder wall appeared thickened (6.8 mm) but this can be seen in the presence of ascites and portal hypertension. Common bile duct: Diameter: 3.5 mm Liver: Diffusely inhomogeneous echotexture with surface nodularity, likely cirrhosis. No suspicious focal lesion. Moderate volume ascites, particularly in the perihepatic spaces. Portal vein is patent with normal flow direction on color Doppler. IMPRESSION: 1. Cirrhotic appearing liver without  suspicious focal lesion. 2. Perihepatic ascites. 3. Cholelithiasis. No tenderness over the gallbladder during the examination. Electronically Signed   By: Andreas Newport M.D.   On: 05/05/2017 01:20    ____________________________________________   PROCEDURES  Procedure(s) performed: None  Procedures  Critical Care performed: No  ____________________________________________   INITIAL IMPRESSION / ASSESSMENT AND PLAN / ED COURSE  Pertinent labs & imaging results that were available during my care of the patient were reviewed by me and considered in my medical decision making (see chart for details).  This is a 61 year old female who comes into the hospital today with some epigastric and right upper quadrant pain as well as vomiting. The patient does have a history of gallstones. I will send the patient for an ultrasound to evaluate for gallstones or choledocholithiasis. The patient does have a bilirubin of 3.8 with some mild transaminitis and some pancreatitis. I will give the patient a dose of morphine and Zofran as well as 500 miles of normal saline. I will reassess the patient once I received the results.    The patient's ultrasound shows gallstones but nothing else. The patient's CT scan shows some thickening of the small intestine involving the proximal jejunum concerning for enteritis. The patient will be given some ciprofloxacin and Flagyl and she will be discharged home to follow-up with her primary care physician. The patient also did receive a dose of potassium chloride as her potassium was 2.9.  ____________________________________________   FINAL CLINICAL IMPRESSION(S) / ED DIAGNOSES  Final diagnoses:  Abdominal pain  Enteritis  Non-intractable vomiting with nausea, unspecified vomiting type      NEW MEDICATIONS STARTED DURING THIS VISIT:  New Prescriptions   CIPROFLOXACIN (CIPRO) 500 MG TABLET    Take 1 tablet (500 mg total) by mouth 2 (two) times daily.    METRONIDAZOLE (FLAGYL) 500 MG TABLET    Take 1 tablet (500 mg total) by mouth 2 (two) times daily.   ONDANSETRON (ZOFRAN ODT) 4 MG DISINTEGRATING TABLET    Take 1 tablet (4 mg total) by mouth every 8 (eight) hours as needed for nausea or vomiting.     Note:  This document was prepared using Dragon voice recognition software and may include unintentional dictation errors.    Loney Hering, MD 05/05/17 [phone]

## 2017-05-05 ENCOUNTER — Encounter: Payer: Self-pay | Admitting: Radiology

## 2017-05-05 ENCOUNTER — Emergency Department: Payer: BC Managed Care – PPO

## 2017-05-05 LAB — TROPONIN I

## 2017-05-05 MED ORDER — CIPROFLOXACIN HCL 500 MG PO TABS: 500.0000 mg | ORAL_TABLET | Freq: Two times a day (BID) | ORAL | 0 refills | Status: AC

## 2017-05-05 MED ORDER — CIPROFLOXACIN IN D5W 400 MG/200ML IV SOLN
400.0000 mg | Freq: Once | INTRAVENOUS | Status: AC
  Administered 2017-05-05: 400 mg via INTRAVENOUS
  Filled 2017-05-05: qty 200

## 2017-05-05 MED ORDER — IOPAMIDOL (ISOVUE-300) INJECTION 61%
30.0000 mL | Freq: Once | INTRAVENOUS | Status: AC
  Administered 2017-05-05: 30 mL via ORAL

## 2017-05-05 MED ORDER — IOPAMIDOL (ISOVUE-300) INJECTION 61%
100.0000 mL | Freq: Once | INTRAVENOUS | Status: AC | PRN
  Administered 2017-05-05: 100 mL via INTRAVENOUS

## 2017-05-05 MED ORDER — ONDANSETRON 4 MG PO TBDP: 4.0000 mg | ORAL_TABLET | Freq: Three times a day (TID) | ORAL | 0 refills | Status: DC | PRN

## 2017-05-05 MED ORDER — POTASSIUM CHLORIDE CRYS ER 20 MEQ PO TBCR
40.0000 meq | EXTENDED_RELEASE_TABLET | Freq: Once | ORAL | Status: AC
  Administered 2017-05-05: 40 meq via ORAL
  Filled 2017-05-05: qty 2

## 2017-05-05 MED ORDER — METRONIDAZOLE 500 MG PO TABS
500.0000 mg | ORAL_TABLET | Freq: Once | ORAL | Status: AC
  Administered 2017-05-05: 500 mg via ORAL
  Filled 2017-05-05: qty 1

## 2017-05-05 MED ORDER — METRONIDAZOLE 500 MG PO TABS: 500.0000 mg | ORAL_TABLET | Freq: Two times a day (BID) | ORAL | 0 refills | Status: AC

## 2017-05-05 NOTE — ED Notes (Signed)

## 2017-05-05 NOTE — Discharge Instructions (Signed)
Please follow-up with your primary care physician for further evaluation. Also please follow up with your GI physician.

## 2017-05-05 NOTE — ED Notes (Signed)
Patient transported to Ultrasound 

## 2017-05-12 ENCOUNTER — Ambulatory Visit
Admission: RE | Admit: 2017-05-12 | Discharge: 2017-05-12 | Disposition: A | Discharge status: Home / Self Care | Attending: Gastroenterology | Admitting: Gastroenterology

## 2017-05-12 DIAGNOSIS — K754 Autoimmune hepatitis: Secondary | ICD-10-CM

## 2017-05-12 DIAGNOSIS — K7460 Unspecified cirrhosis of liver: Principal | ICD-10-CM

## 2017-05-12 DIAGNOSIS — R188 Other ascites: Secondary | ICD-10-CM

## 2017-06-10 ENCOUNTER — Ambulatory Visit
Admission: RE | Admit: 2017-06-10 | Discharge: 2017-06-10 | Disposition: A | Discharge status: Home / Self Care | Attending: Gastroenterology | Admitting: Gastroenterology

## 2017-06-10 DIAGNOSIS — R188 Other ascites: Secondary | ICD-10-CM

## 2017-06-10 DIAGNOSIS — K7460 Unspecified cirrhosis of liver: Principal | ICD-10-CM

## 2017-07-09 ENCOUNTER — Ambulatory Visit: Admission: RE | Admit: 2017-07-09 | Discharge: 2017-07-09 | Disposition: A | Discharge status: Home / Self Care

## 2017-07-09 ENCOUNTER — Ambulatory Visit
Admission: RE | Admit: 2017-07-09 | Discharge: 2017-07-09 | Disposition: A | Discharge status: Home / Self Care | Attending: Anesthesiology | Admitting: Anesthesiology

## 2017-07-09 DIAGNOSIS — K7460 Unspecified cirrhosis of liver: Principal | ICD-10-CM

## 2017-07-14 ENCOUNTER — Ambulatory Visit: Admission: RE | Admit: 2017-07-14 | Discharge: 2017-07-14 | Disposition: A | Discharge status: Home / Self Care

## 2017-07-14 ENCOUNTER — Ambulatory Visit
Admission: RE | Admit: 2017-07-14 | Discharge: 2017-07-14 | Disposition: A | Discharge status: Home / Self Care | Attending: Registered" | Admitting: Registered"

## 2017-07-14 DIAGNOSIS — K7460 Unspecified cirrhosis of liver: Principal | ICD-10-CM

## 2017-07-14 DIAGNOSIS — K7290 Hepatic failure, unspecified without coma: Principal | ICD-10-CM

## 2017-07-14 DIAGNOSIS — Z01818 Encounter for other preprocedural examination: Principal | ICD-10-CM

## 2017-07-21 ENCOUNTER — Ambulatory Visit: Admission: RE | Admit: 2017-07-21 | Discharge: 2017-07-21 | Disposition: A | Discharge status: Home / Self Care

## 2017-07-21 ENCOUNTER — Ambulatory Visit
Admission: RE | Admit: 2017-07-21 | Discharge: 2017-07-21 | Disposition: A | Discharge status: Home / Self Care | Attending: Psychologist | Admitting: Psychologist

## 2017-07-21 ENCOUNTER — Ambulatory Visit: Admission: RE | Admit: 2017-07-21 | Discharge: 2017-08-03 | Disposition: A | Discharge status: Home / Self Care

## 2017-07-21 DIAGNOSIS — K7290 Hepatic failure, unspecified without coma: Principal | ICD-10-CM

## 2017-07-27 ENCOUNTER — Ambulatory Visit: Admission: RE | Admit: 2017-07-27 | Discharge: 2017-07-27 | Disposition: A | Discharge status: Home / Self Care

## 2017-07-27 DIAGNOSIS — Z01818 Encounter for other preprocedural examination: Principal | ICD-10-CM

## 2017-07-27 DIAGNOSIS — G4733 Obstructive sleep apnea (adult) (pediatric): Secondary | ICD-10-CM

## 2017-07-27 DIAGNOSIS — K219 Gastro-esophageal reflux disease without esophagitis: Secondary | ICD-10-CM

## 2017-07-27 DIAGNOSIS — K7290 Hepatic failure, unspecified without coma: Principal | ICD-10-CM

## 2017-07-27 DIAGNOSIS — E039 Hypothyroidism, unspecified: Secondary | ICD-10-CM

## 2017-07-27 DIAGNOSIS — K754 Autoimmune hepatitis: Principal | ICD-10-CM

## 2017-08-17 ENCOUNTER — Other Ambulatory Visit: Payer: Self-pay | Admitting: Obstetrics and Gynecology

## 2017-08-17 DIAGNOSIS — Z1231 Encounter for screening mammogram for malignant neoplasm of breast: Secondary | ICD-10-CM

## 2017-08-21 MED ORDER — PREDNISONE 5 MG TABLET
ORAL_TABLET | Freq: Every day | ORAL | 11 refills | 0 days | Status: CP
Start: 2017-08-21 — End: 2018-05-05

## 2017-09-29 ENCOUNTER — Ambulatory Visit
Admission: RE | Admit: 2017-09-29 | Discharge: 2017-09-29 | Disposition: A | Discharge status: Home / Self Care | Payer: BC Managed Care – PPO | Source: Ambulatory Visit | Attending: Obstetrics and Gynecology | Admitting: Obstetrics and Gynecology

## 2017-09-29 DIAGNOSIS — Z1231 Encounter for screening mammogram for malignant neoplasm of breast: Secondary | ICD-10-CM | POA: Diagnosis not present

## 2017-09-30 ENCOUNTER — Ambulatory Visit
Admission: RE | Admit: 2017-09-30 | Discharge: 2017-09-30 | Disposition: A | Discharge status: Home / Self Care | Attending: Gastroenterology | Admitting: Gastroenterology

## 2017-09-30 ENCOUNTER — Ambulatory Visit: Admission: RE | Admit: 2017-09-30 | Discharge: 2017-09-30 | Disposition: A | Discharge status: Home / Self Care

## 2017-09-30 DIAGNOSIS — K754 Autoimmune hepatitis: Principal | ICD-10-CM

## 2017-09-30 DIAGNOSIS — K7290 Hepatic failure, unspecified without coma: Principal | ICD-10-CM

## 2017-10-01 MED ORDER — FUROSEMIDE 20 MG TABLET
ORAL_TABLET | Freq: Every day | ORAL | 11 refills | 0 days | Status: CP
Start: 2017-10-01 — End: 2017-10-08

## 2017-10-08 MED ORDER — SPIRONOLACTONE 50 MG TABLET
ORAL_TABLET | Freq: Every day | ORAL | 11 refills | 0 days | Status: CP
Start: 2017-10-08 — End: 2018-02-24

## 2017-10-08 MED ORDER — FUROSEMIDE 20 MG TABLET
ORAL_TABLET | Freq: Every day | ORAL | 11 refills | 0 days | Status: CP
Start: 2017-10-08 — End: 2017-10-22

## 2017-10-13 DIAGNOSIS — Z944 Liver transplant status: Secondary | ICD-10-CM

## 2017-10-13 HISTORY — DX: Liver transplant status: Z94.4

## 2017-10-13 HISTORY — PX: LIVER TRANSPLANT: SHX410

## 2017-10-13 NOTE — Progress Notes (Signed)
10/14/2017 11:19 AM   [name] [birth year] [id number]  Referring provider: Glendon Axe, MD [facility] [place], [place] [address]  Chief Complaint  Patient presents with  . Urinary Incontinence    HPI: Patient is a 62 -year-old Caucasian female who is referred to [place] by, Perrin Smack, CNM, for urinary incontinence.  Patient states that she has had urinary incontinence for "a long time."  She is experiencing an occasional incontinent episode during the day. She is experiencing 3 to 4 incontinent episodes during the night.  Her incontinence volume is large.  She is wearing overnight depends.     She is having associated urinary frequency, urgency and nocturia.   She does not have a history of urinary tract infections, STI's or injury to the bladder.  She denies dysuria, gross hematuria, suprapubic pain, back pain, abdominal pain or flank pain.  She has not had any recent fevers, chills, nausea or vomiting.    She does not have a history of nephrolithiasis, GU surgery or GU trauma.   She is sexually active.  She has noted incontinence with sexual intercourse.    She is post menopausal.   She admits to diarrhea.   She is not having pain with bladder filling.    Contrast CT in 04/2017 noted normal adrenal glands. Homogeneous renal enhancement with symmetric excretion on delayed phase imaging. No hydronephrosis or perinephric edema. Urinary bladder is physiologically distended without wall thickening.  Cirrhotic hepatic morphology. Moderate volume of intra-abdominal/pelvic ascites.  Portal hypertension with splenomegaly.  Moderate length segment small bowel thickening and inflammation involving proximal jejunum in the left abdomen consistent with enteritis. This may be infectious, inflammatory, or less likely secondary to portal enteropathy.  Cholelithiasis.  Aortic Atherosclerosis   She is drinking a lot of water daily.   She is drinking  one caffeinated beverages daily.  She is not drinking alcoholic beverages.     She states she has a history of sleep apnea, but she has not had a sleep study in awhile.  Her husband has witnessed apneic events.   Reviewed referral notes.      Failed pessary placement  PMH: Past Medical History:  Diagnosis Date  . Abnormal liver enzymes 03/19/2015  . Anemia   . Arthritis   . Autoimmune hepatitis ([place]) 04/23/2015  . 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 GERD (gastroesophageal reflux disease)   . Hand discomfort 12/20/2014  . Hearing decreased   . History of MRSA infection    left thigh  . Hypothyroidism   . Iron deficiency anemia 03/19/2015  . Sleep apnea 03/19/2015    Surgical History: Past Surgical History:  Procedure Laterality Date  . ABDOMINAL HYSTERECTOMY    . Bladder tack    . COLONOSCOPY WITH PROPOFOL N/A 04/24/2016   Procedure: COLONOSCOPY WITH PROPOFOL;  Surgeon: Manya Silvas, MD;  Location: [facility] ENDOSCOPY;  Service: Endoscopy;  Laterality: N/A;  . INNER EAR SURGERY Bilateral 03/19/2012  . NASAL SINUS SURGERY    . SALPINGOOPHORECTOMY    . Tympanoplasty with mastoidectomy      Home Medications:  Allergies as of 10/14/2017   No Known Allergies     Medication List        Accurate as of 10/14/17 11:19 AM. Always use your most recent med list.          B COMPLEX VITAMINS ER PO Take by mouth daily.   calcium-vitamin D 500-200 MG-UNIT tablet Commonly known as:  OSCAL WITH D Take 1 tablet by mouth.   estropipate 0.75 MG tablet Commonly known as:  OGEN Take 0.75 mg by mouth daily.   ESTROPIPATE PO Take by mouth daily.   ferrous fumarate 325 (106 Fe) MG Tabs tablet Commonly known as:  HEMOCYTE - 106 mg FE Take 1 tablet by mouth.   folic acid 1 MG tablet Commonly known as:  FOLVITE Take 2 mg by mouth daily.   furosemide 20 MG tablet Commonly known as:  LASIX Take 20 mg by mouth.   levothyroxine 88 MCG tablet Commonly known as:  SYNTHROID,  LEVOTHROID Take 88 mcg by mouth daily before breakfast.   omeprazole 40 MG capsule Commonly known as:  PRILOSEC TAKE 1 CAPSULE BY MOUTH ONCE DAILY FOR REFLUX.   predniSONE 10 MG tablet Commonly known as:  DELTASONE Take 6 tablets (60 mg total) by mouth daily with breakfast. 60mg /day for 1 week then 40mg /day for a week then 30mg /day for 2 weeks then 20 mg a day.   SIMPONI 50 MG/0.5ML [place] Generic drug:  Golimumab Inject into the skin.   spironolactone 50 MG tablet Commonly known as:  ALDACTONE Take 50 mg by mouth daily.   traMADol 50 MG tablet Commonly known as:  ULTRAM Take by mouth.       Allergies: No Known Allergies  Family History: Family History  Problem Relation Age of Onset  . Heart disease Father   . Arthritis Sister   . Heart disease Mother   . Thyroid disease Mother   . Cancer Sister   . Heart disease Maternal Grandmother   . Heart disease Maternal Grandfather   . Colon cancer Maternal Aunt   . Breast cancer Neg Hx     Social History:  reports that she has quit smoking. she has never used smokeless tobacco. She reports that she drinks alcohol. She reports that she does not use drugs.  ROS: UROLOGY Frequent Urination?: Yes Hard to postpone urination?: Yes Burning/pain with urination?: No Get up at night to urinate?: Yes Leakage of urine?: Yes Urine stream starts and stops?: No Trouble starting stream?: No Do you have to strain to urinate?: No Blood in urine?: No Urinary tract infection?: No Sexually transmitted disease?: No Injury to kidneys or bladder?: No Painful intercourse?: No Weak stream?: No Currently pregnant?: No Vaginal bleeding?: No Last menstrual period?: n  Gastrointestinal Nausea?: No Vomiting?: No Indigestion/heartburn?: Yes Diarrhea?: No Constipation?: No  Constitutional Fever: No Night sweats?: No Weight loss?: No Fatigue?: Yes  Skin Skin rash/lesions?: No Itching?: No  Eyes Blurred vision?: No Double  vision?: No  Ears/Nose/Throat Sore throat?: No Sinus problems?: No  Hematologic/Lymphatic Swollen glands?: No Easy bruising?: Yes  Cardiovascular Leg swelling?: Yes Chest pain?: No  Respiratory Cough?: No Shortness of breath?: No  Endocrine Excessive thirst?: No  Musculoskeletal Back pain?: Yes Joint pain?: Yes  Neurological Headaches?: No Dizziness?: No  Psychologic Depression?: No Anxiety?: No  Physical Exam: BP 109/69 (BP Location: Right Arm, Patient Position: Sitting, Cuff Size: Normal)   Pulse 85   Ht 5' (1.524 m)   Wt 131 lb 14.4 oz (59.8 kg)   BMI 25.76 kg/m   Constitutional: Well nourished. Alert and oriented, No acute distress. HEENT: [place] AT, moist mucus membranes. Trachea midline, no masses. Cardiovascular: No clubbing, cyanosis, or edema. Respiratory: Normal respiratory effort, no increased work of breathing. GI: Abdomen is soft, non tender, non distended, no abdominal masses. Liver and spleen not palpable.  No hernias appreciated.  Stool sample  for occult testing is not indicated.   GU: No CVA tenderness.  No bladder fullness or masses.  Atrophic external genitalia, normal pubic hair distribution, no lesions.  Normal urethral meatus, no lesions, no prolapse, no discharge.   No urethral masses, tenderness and/or tenderness. No bladder fullness, tenderness or masses. Pale vagina mucosa, good estrogen effect, no discharge, no lesions, good pelvic support, Grade I cystocele and rectocele noted.  Cervix and uterus are surgically absent.  No adnexal/parametria masses or tenderness noted.  Anus and perineum are without rashes or lesions.    Skin: No rashes, bruises or suspicious lesions. Lymph: No cervical or inguinal adenopathy. Neurologic: Grossly intact, no focal deficits, moving all 4 extremities. Psychiatric: Normal mood and affect.  Laboratory Data: Lab Results  Component Value Date   WBC 2.5 (L) 05/04/2017   HGB 12.5 05/04/2017   HCT 35.3  05/04/2017   MCV 115.3 (H) 05/04/2017   PLT 102 (L) 05/04/2017    Lab Results  Component Value Date   CREATININE 0.67 05/04/2017    Lab Results  Component Value Date   AST 61 (H) 05/04/2017   Lab Results  Component Value Date   ALT 38 05/04/2017   Urinalysis    Component Value Date/Time   COLORURINE AMBER (A) 05/04/2017 2042   APPEARANCEUR CLEAR (A) 05/04/2017 2042   LABSPEC 1.012 05/04/2017 2042   PHURINE 7.0 05/04/2017 2042   GLUCOSEU NEGATIVE 05/04/2017 2042   HGBUR NEGATIVE 05/04/2017 2042   BILIRUBINUR NEGATIVE 05/04/2017 2042   KETONESUR NEGATIVE 05/04/2017 2042   PROTEINUR NEGATIVE 05/04/2017 2042   NITRITE NEGATIVE 05/04/2017 2042   LEUKOCYTESUR MODERATE (A) 05/04/2017 2042    I have reviewed the labs.   Pertinent Imaging: Results for THEA, HOLSHOUSER (MRN [id number]) as of 10/14/2017 11:17  Ref. Range 10/14/2017 10:47  Scan Result Unknown [id number]   This is due to ascites as I straight cathed her in the office and there was no residual urine  Procedure In and Out Catheterization Patient is present today for a I & O catheterization due to confirmation of bladder scan. Patient was cleaned and prepped in a sterile fashion with betadine and Lidocaine 2% jelly was instilled into the urethra.  A 14 FR cath was inserted no complications were noted , 10 ml of urine return was noted, urine was yellow in color.   Preformed by: Zara Council, PA-C  Assessment & Plan:    1. Night time Incontinence  - fluid management - she is avoiding liquids in the evening  - not a candidate for medical therapy due to her Childs-Pugh class III liver failure   - failed pessary fitting   - needs a sleep study - see nocturia  2. Vaginal atrophy  - not a candidate for vaginal cream due to liver failure  3. Nocturia  - I explained to the patient that nocturia is often multi-factorial and difficult to treat.  Sleeping disorders, heart conditions, peripheral vascular disease, diabetes,  an enlarged prostate for men, an urethral stricture causing bladder outlet obstruction and/or certain medications can contribute to nocturia.  - I have suggested that the patient avoid caffeine after noon and alcohol in the evening.  He or she may also benefit from fluid restrictions after 6:00 in the evening and voiding just prior to bedtime.  - I have explained that research studies have showed that over 84% of patients with sleep apnea reported frequent nighttime urination.   With sleep apnea, oxygen decreases, carbon dioxide increases, the  blood become more acidic, the heart rate drops and blood vessels in the lung constrict.  The body is then alerted that something is very wrong. The sleeper must wake enough to reopen the airway. By this time, the heart is racing and experiences a false signal of fluid overload. The heart excretes a hormone-like protein that tells the body to get rid of sodium and water, resulting in nocturia.  - The patient may benefit from a discussion with his or her primary care physician to see if he or she has risk factors for sleep apnea or other sleep disturbances and obtaining a sleep study - she is seeing her PCP, Dr. Candiss Norse this afternoon.     Return for follow up after sleep study.  These notes generated with voice recognition software. I apologize for typographical errors.  Zara Council, [facility] [address] [place],  [address] [phone]

## 2017-10-14 ENCOUNTER — Encounter: Payer: Self-pay | Admitting: Urology

## 2017-10-14 ENCOUNTER — Ambulatory Visit: Payer: BC Managed Care – PPO | Admitting: Urology

## 2017-10-14 VITALS — BP 109/69 | HR 85 | Ht 60.0 in | Wt 131.9 lb

## 2017-10-14 DIAGNOSIS — N3946 Mixed incontinence: Secondary | ICD-10-CM

## 2017-10-14 DIAGNOSIS — N952 Postmenopausal atrophic vaginitis: Secondary | ICD-10-CM

## 2017-10-14 DIAGNOSIS — R351 Nocturia: Secondary | ICD-10-CM | POA: Diagnosis not present

## 2017-10-14 LAB — BLADDER SCAN AMB NON-IMAGING: SCAN RESULT: 389

## 2017-10-22 MED ORDER — FUROSEMIDE 20 MG TABLET
ORAL_TABLET | Freq: Every day | ORAL | 11 refills | 0.00000 days | Status: CP
Start: 2017-10-22 — End: 2018-03-04

## 2017-12-08 ENCOUNTER — Ambulatory Visit
Admit: 2017-12-08 | Discharge: 2017-12-11 | Disposition: A | Discharge status: Home / Self Care | Payer: PRIVATE HEALTH INSURANCE | Source: Other Acute Inpatient Hospital | Admitting: Internal Medicine

## 2017-12-11 MED ORDER — CIPROFLOXACIN 500 MG TABLET
ORAL_TABLET | 0 refills | 0.00000 days | Status: SS
Start: 2017-12-11 — End: 2018-02-02

## 2018-01-04 ENCOUNTER — Ambulatory Visit: Admit: 2018-01-04 | Discharge: 2018-01-05 | Payer: PRIVATE HEALTH INSURANCE

## 2018-01-04 DIAGNOSIS — K7290 Hepatic failure, unspecified without coma: Secondary | ICD-10-CM

## 2018-01-04 DIAGNOSIS — K7460 Unspecified cirrhosis of liver: Principal | ICD-10-CM

## 2018-01-06 ENCOUNTER — Other Ambulatory Visit: Payer: Self-pay | Admitting: Gastroenterology

## 2018-02-01 ENCOUNTER — Ambulatory Visit: Admit: 2018-02-01 | Discharge: 2018-02-04 | Payer: PRIVATE HEALTH INSURANCE

## 2018-02-01 DIAGNOSIS — R1011 Right upper quadrant pain: Principal | ICD-10-CM

## 2018-02-03 DIAGNOSIS — R1011 Right upper quadrant pain: Principal | ICD-10-CM

## 2018-02-04 MED ORDER — OMEPRAZOLE 40 MG CAPSULE,DELAYED RELEASE
ORAL_CAPSULE | Freq: Two times a day (BID) | ORAL | 0 refills | 0.00000 days | Status: CP
Start: 2018-02-04 — End: 2018-03-23

## 2018-02-24 MED ORDER — SPIRONOLACTONE 50 MG TABLET
ORAL_TABLET | Freq: Every day | ORAL | 11 refills | 0.00000 days | Status: CP
Start: 2018-02-24 — End: 2018-03-04

## 2018-03-04 MED ORDER — FUROSEMIDE 20 MG TABLET
ORAL_TABLET | Freq: Every day | ORAL | 11 refills | 0 days | Status: SS
Start: 2018-03-04 — End: 2018-04-14

## 2018-03-04 MED ORDER — SPIRONOLACTONE 50 MG TABLET
ORAL_TABLET | Freq: Every day | ORAL | 11 refills | 0 days | Status: SS
Start: 2018-03-04 — End: 2018-04-14

## 2018-03-04 MED ORDER — ONDANSETRON HCL 4 MG TABLET
ORAL_TABLET | Freq: Two times a day (BID) | ORAL | 0 refills | 0.00000 days | Status: CP | PRN
Start: 2018-03-04 — End: 2018-03-26

## 2018-03-09 ENCOUNTER — Other Ambulatory Visit: Payer: Self-pay

## 2018-03-09 ENCOUNTER — Encounter: Payer: Self-pay | Admitting: Emergency Medicine

## 2018-03-09 ENCOUNTER — Emergency Department: Payer: BC Managed Care – PPO

## 2018-03-09 ENCOUNTER — Emergency Department
Admission: EM | Admit: 2018-03-09 | Discharge: 2018-03-09 | Disposition: A | Discharge status: Other Acute Inpatient Hospital | Payer: BC Managed Care – PPO | Attending: Emergency Medicine | Admitting: Emergency Medicine

## 2018-03-09 DIAGNOSIS — M7918 Myalgia, other site: Secondary | ICD-10-CM | POA: Diagnosis not present

## 2018-03-09 DIAGNOSIS — R112 Nausea with vomiting, unspecified: Secondary | ICD-10-CM

## 2018-03-09 DIAGNOSIS — Z87891 Personal history of nicotine dependence: Secondary | ICD-10-CM | POA: Diagnosis not present

## 2018-03-09 DIAGNOSIS — R05 Cough: Secondary | ICD-10-CM | POA: Insufficient documentation

## 2018-03-09 DIAGNOSIS — R7989 Other specified abnormal findings of blood chemistry: Secondary | ICD-10-CM

## 2018-03-09 DIAGNOSIS — A419 Sepsis, unspecified organism: Secondary | ICD-10-CM

## 2018-03-09 DIAGNOSIS — E039 Hypothyroidism, unspecified: Secondary | ICD-10-CM | POA: Insufficient documentation

## 2018-03-09 DIAGNOSIS — E871 Hypo-osmolality and hyponatremia: Secondary | ICD-10-CM | POA: Diagnosis not present

## 2018-03-09 DIAGNOSIS — R509 Fever, unspecified: Secondary | ICD-10-CM | POA: Diagnosis present

## 2018-03-09 DIAGNOSIS — R109 Unspecified abdominal pain: Secondary | ICD-10-CM

## 2018-03-09 DIAGNOSIS — K769 Liver disease, unspecified: Secondary | ICD-10-CM | POA: Diagnosis not present

## 2018-03-09 DIAGNOSIS — Z79899 Other long term (current) drug therapy: Secondary | ICD-10-CM | POA: Diagnosis not present

## 2018-03-09 HISTORY — DX: Spontaneous bacterial peritonitis: K65.2

## 2018-03-09 LAB — PROTIME-INR
INR: 1.98
PROTHROMBIN TIME: 22.3 s — AB (ref 11.4–15.2)

## 2018-03-09 LAB — URINALYSIS, COMPLETE (UACMP) WITH MICROSCOPIC
Bacteria, UA: NONE SEEN
Glucose, UA: NEGATIVE mg/dL
Hgb urine dipstick: NEGATIVE
Ketones, ur: 5 mg/dL — AB
Leukocytes, UA: NEGATIVE
Nitrite: NEGATIVE
PH: 5 (ref 5.0–8.0)
Protein, ur: NEGATIVE mg/dL
SPECIFIC GRAVITY, URINE: 1.026 (ref 1.005–1.030)

## 2018-03-09 LAB — COMPREHENSIVE METABOLIC PANEL
ALK PHOS: 78 U/L (ref 38–126)
ALT: 38 U/L (ref 14–54)
AST: 88 U/L — AB (ref 15–41)
Albumin: 2.6 g/dL — ABNORMAL LOW (ref 3.5–5.0)
Anion gap: 10 (ref 5–15)
BUN: 10 mg/dL (ref 6–20)
CALCIUM: 7.8 mg/dL — AB (ref 8.9–10.3)
CO2: 23 mmol/L (ref 22–32)
CREATININE: 0.76 mg/dL (ref 0.44–1.00)
Chloride: 94 mmol/L — ABNORMAL LOW (ref 101–111)
GFR calc non Af Amer: >60 mL/min (ref >60)
Glucose, Bld: 85 mg/dL (ref 65–99)
Potassium: 3.2 mmol/L — ABNORMAL LOW (ref 3.5–5.1)
Sodium: 127 mmol/L — ABNORMAL LOW (ref 135–145)
Total Bilirubin: 4.7 mg/dL — ABNORMAL HIGH (ref 0.3–1.2)
Total Protein: 6.9 g/dL (ref 6.5–8.1)

## 2018-03-09 LAB — CBC
HCT: 30 % — ABNORMAL LOW (ref 35.0–47.0)
Hemoglobin: 10.6 g/dL — ABNORMAL LOW (ref 12.0–16.0)
MCH: 44.3 pg — ABNORMAL HIGH (ref 26.0–34.0)
MCHC: 35.3 g/dL (ref 32.0–36.0)
MCV: 125.5 fL — AB (ref 80.0–100.0)
PLATELETS: 71 10*3/uL — AB (ref 150–440)
RBC: 2.39 MIL/uL — AB (ref 3.80–5.20)
RDW: 19.1 % — AB (ref 11.5–14.5)
WBC: 2.7 10*3/uL — ABNORMAL LOW (ref 3.6–11.0)

## 2018-03-09 LAB — TROPONIN I

## 2018-03-09 LAB — LIPASE, BLOOD: Lipase: 57 U/L — ABNORMAL HIGH (ref 11–51)

## 2018-03-09 LAB — APTT: aPTT: 41 seconds — ABNORMAL HIGH (ref 24–36)

## 2018-03-09 LAB — LACTIC ACID, PLASMA
LACTIC ACID, VENOUS: 1.8 mmol/L (ref 0.5–1.9)
Lactic Acid, Venous: 2 mmol/L (ref 0.5–1.9)

## 2018-03-09 LAB — PROCALCITONIN: Procalcitonin: <0.1 ng/mL

## 2018-03-09 MED ORDER — ONDANSETRON HCL 4 MG/2ML IJ SOLN
INTRAMUSCULAR | Status: AC
  Administered 2018-03-09: 4 mg via INTRAVENOUS
  Filled 2018-03-09: qty 2

## 2018-03-09 MED ORDER — SODIUM CHLORIDE 0.9 % IV BOLUS (SEPSIS)
750.0000 mL | Freq: Once | INTRAVENOUS | Status: AC
  Administered 2018-03-09: 750 mL via INTRAVENOUS

## 2018-03-09 MED ORDER — ONDANSETRON HCL 4 MG/2ML IJ SOLN
4.0000 mg | Freq: Once | INTRAMUSCULAR | Status: AC
  Administered 2018-03-09: 4 mg via INTRAVENOUS

## 2018-03-09 MED ORDER — SODIUM CHLORIDE 0.9 % IV BOLUS (SEPSIS)
1000.0000 mL | Freq: Once | INTRAVENOUS | Status: AC
  Administered 2018-03-09: 1000 mL via INTRAVENOUS

## 2018-03-09 MED ORDER — PIPERACILLIN-TAZOBACTAM 3.375 G IVPB 30 MIN
3.3750 g | Freq: Once | INTRAVENOUS | Status: AC
  Administered 2018-03-09: 3.375 g via INTRAVENOUS
  Filled 2018-03-09: qty 50

## 2018-03-09 MED ORDER — IBUPROFEN 400 MG PO TABS
600.0000 mg | ORAL_TABLET | Freq: Once | ORAL | Status: AC
  Administered 2018-03-09: 600 mg via ORAL
  Filled 2018-03-09: qty 2

## 2018-03-09 NOTE — ED Notes (Signed)
Pt po challenged with saltine cracker and water.

## 2018-03-09 NOTE — ED Provider Notes (Signed)
[facility] Emergency Department Provider Note  ____________________________________________   First MD Initiated Contact with Patient 03/09/18 1508     (approximate)  I have reviewed the triage vital signs and the nursing notes.   HISTORY  Chief Complaint Fever; Generalized Body Aches; and Cough    HPI Barbara Moss is a 62 y.o. female his medical history is notable for liver disease and who is on the liver transplant list at [facility] and is cared for by a Dr. Dorcas Mcmurray.  She presents for evaluation of about 5 days of fever/chills, nausea, vomiting, inability to tolerate p.o., and diffuse episodes of loose stools.  She describes the symptoms as severe and nothing is making them better or worse.  She has had a fever to 102 at home and had a fever of 101 in triage.  She is a transplant patient who goes to [facility] and she was treated recently with additional diuretics to keep her fluid level down.  She has been told in the past that she does not have enough ascites to be drained safely although she reports that about 6 months ago she was diagnosed with SBP.  She says this does not feel similar to that.  She has had intermittent mild abdominal discomfort but reports no pain at this time.  She has chills and generalized body aches all over, which she describes as "flulike".  She is having no difficulty breathing and no cough.  Past Medical History:  Diagnosis Date  . Abnormal liver enzymes 03/19/2015  . Anemia   . Arthritis   . Autoimmune hepatitis ([place]) 04/23/2015  . 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 GERD (gastroesophageal reflux disease)   . Hand discomfort 12/20/2014  . Hearing decreased   . History of MRSA infection    left thigh  . Hypothyroidism   . Iron deficiency anemia 03/19/2015  . Sleep apnea 03/19/2015  . Spontaneous bacterial peritonitis [facility])     Patient Active Problem List   Diagnosis Date Noted  . Calculus of gallbladder 04/23/2015  .  Autoimmune hepatitis ([place]) 04/23/2015  . Allergic state 03/19/2015  . Abnormal liver enzymes 03/19/2015  . Acid reflux 03/19/2015  . Iron deficiency anemia 03/19/2015  . Rheumatoid arthritis with rheumatoid factor ([place]) 03/19/2015  . Sleep apnea 03/19/2015  . Thyroid disease 03/19/2015  . Cirrhosis, non-alcoholic ([place]) 22/97/9892  . Hand discomfort 12/20/2014  . Conductive hearing loss of both ears 05/02/2014  . Adult hypothyroidism 01/27/2013    Past Surgical History:  Procedure Laterality Date  . ABDOMINAL HYSTERECTOMY    . Bladder tack    . COLONOSCOPY WITH PROPOFOL N/A 04/24/2016   Procedure: COLONOSCOPY WITH PROPOFOL;  Surgeon: Manya Silvas, MD;  Location: [facility] ENDOSCOPY;  Service: Endoscopy;  Laterality: N/A;  . INNER EAR SURGERY Bilateral 03/19/2012  . NASAL SINUS SURGERY    . SALPINGOOPHORECTOMY    . Tympanoplasty with mastoidectomy      Prior to Admission medications   Medication Sig Start Date End Date Taking? Authorizing Provider  B COMPLEX VITAMINS ER PO Take by mouth daily.    [provider]  calcium-vitamin D (OSCAL WITH D) 500-200 MG-UNIT per tablet Take 1 tablet by mouth.    [provider]  estropipate (OGEN) 0.75 MG tablet Take 0.75 mg by mouth daily.    [provider]  ESTROPIPATE PO Take by mouth daily.    [provider]  ferrous fumarate (HEMOCYTE - 106 MG FE) 325 (106 FE) MG  TABS tablet Take 1 tablet by mouth.    [provider]  folic acid (FOLVITE) 1 MG tablet Take 2 mg by mouth daily.    [provider]  furosemide (LASIX) 20 MG tablet Take 20 mg by mouth.    [provider]  Golimumab ([place]) 50 MG/0.5ML SOAJ Inject into the skin.    [provider]  levothyroxine (SYNTHROID, LEVOTHROID) 88 MCG tablet Take 88 mcg by mouth daily before breakfast.    [provider]  omeprazole (PRILOSEC) 40 MG capsule TAKE 1 CAPSULE BY MOUTH ONCE DAILY FOR REFLUX. 01/07/18   Lucilla Lame, MD  predniSONE (DELTASONE) 10 MG tablet Take 6 tablets (60 mg total) by mouth daily with breakfast. 60mg /day for 1 week then 40mg /day for a week then 30mg /day for 2 weeks then 20 mg a day. 03/21/15   Lucilla Lame, MD  spironolactone (ALDACTONE) 50 MG tablet Take 50 mg by mouth daily.    [provider]  traMADol (ULTRAM) 50 MG tablet Take by mouth. 04/25/15   [provider]    Allergies Patient has no known allergies.  Family History  Problem Relation Age of Onset  . Heart disease Father   . Arthritis Sister   . Heart disease Mother   . Thyroid disease Mother   . Cancer Sister   . Heart disease Maternal Grandmother   . Heart disease Maternal Grandfather   . Colon cancer Maternal Aunt   . Breast cancer Neg Hx     Social History Social History   Tobacco Use  . Smoking status: Former Research scientist (life sciences)  . Smokeless tobacco: Never Used  Substance Use Topics  . Alcohol use: Yes    Alcohol/week: 0.0 oz    Comment: occasioanl  . Drug use: No    Review of Systems Constitutional: Fever/chills and generalized body aches for about 5 days Eyes: No visual changes. ENT: No sore throat. Cardiovascular: Denies chest pain. Respiratory: Denies shortness of breath. Gastrointestinal: Mild abdominal discomfort with persistent nausea and vomiting as described above.  Minimal diarrhea. Genitourinary: Negative for dysuria. Musculoskeletal: Negative for neck pain.  Negative for back pain. Integumentary: Negative for rash. Neurological: Negative for headaches, focal weakness or numbness.   ____________________________________________   PHYSICAL EXAM:  VITAL SIGNS: ED Triage Vitals  Enc Vitals Group     BP 03/09/18 1224 (!) 109/54     Pulse Rate 03/09/18 1224 (!) 111     Resp 03/09/18 1224 18     Temp 03/09/18 1224 (!) 101.2 F (38.4 C)     Temp Source 03/09/18 1224 Oral     SpO2 03/09/18 1224 97 %     Weight 03/09/18 1226 56.2 kg (124 lb)     Height 03/09/18 1226  1.499 m (4\' 11" )     Head Circumference --      Peak Flow --      Pain Score 03/09/18 1226 8     Pain Loc --      Pain Edu? --      Excl. in [place]? --     Constitutional: Alert and oriented, appears chronically ill but nontoxic. Eyes: No scleral icterus.  Pupils are equal and reactive. Head: Atraumatic. Nose: No congestion/rhinnorhea. Mouth/Throat: Mucous membranes are dry. Neck: No stridor.  No meningeal signs.   Cardiovascular: Tachycardia, regular rhythm. Good peripheral circulation. Grossly normal heart sounds. Respiratory: Normal respiratory effort.  No retractions. Lungs CTAB. Gastrointestinal: Soft and nontender. No distention, no clinically significant amount of ascites Musculoskeletal:  No lower extremity tenderness nor edema. No gross deformities of extremities. Neurologic:  Normal speech and language. No gross focal neurologic deficits are appreciated.  Skin:  Skin is warm, dry and intact.  Question mild jaundice. Psychiatric: Mood and affect are normal. Speech and behavior are normal.  ____________________________________________   LABS (all labs ordered are listed, but only abnormal results are displayed)  Labs Reviewed  LIPASE, BLOOD - Abnormal; Notable for the following components:      Result Value   Lipase 57 (*)    All other components within normal limits  COMPREHENSIVE METABOLIC PANEL - Abnormal; Notable for the following components:   Sodium 127 (*)    Potassium 3.2 (*)    Chloride 94 (*)    Calcium 7.8 (*)    Albumin 2.6 (*)    AST 88 (*)    Total Bilirubin 4.7 (*)    All other components within normal limits  CBC - Abnormal; Notable for the following components:   WBC 2.7 (*)    RBC 2.39 (*)    Hemoglobin 10.6 (*)    HCT 30.0 (*)    MCV 125.5 (*)    MCH 44.3 (*)    RDW 19.1 (*)    Platelets 71 (*)    All other components within normal limits  URINALYSIS, COMPLETE (UACMP) WITH MICROSCOPIC - Abnormal; Notable for the following components:   Color,  Urine AMBER (*)    APPearance HAZY (*)    Bilirubin Urine SMALL (*)    Ketones, ur 5 (*)    All other components within normal limits  LACTIC ACID, PLASMA - Abnormal; Notable for the following components:   Lactic Acid, Venous 2.0 (*)    All other components within normal limits  PROTIME-INR - Abnormal; Notable for the following components:   Prothrombin Time 22.3 (*)    All other components within normal limits  APTT - Abnormal; Notable for the following components:   aPTT 41 (*)    All other components within normal limits  CULTURE, BLOOD (ROUTINE X 2)  CULTURE, BLOOD (ROUTINE X 2)  URINE CULTURE  LACTIC ACID, PLASMA  TROPONIN I  PROCALCITONIN   ____________________________________________  EKG  ED ECG REPORT I, Hinda Kehr, the attending physician, personally viewed and interpreted this ECG.  Date: 03/09/2018 EKG Time: 12: 32 Rate: 110 Rhythm: Sinus tachycardia QRS Axis: normal Intervals: normal ST/T Wave abnormalities: Non-specific ST segment / T-wave changes, but no evidence of acute ischemia. Narrative Interpretation: no evidence of acute ischemia   ____________________________________________  RADIOLOGY I, Hinda Kehr, personally viewed and evaluated these images (plain radiographs) as part of my medical decision making, as well as reviewing the written report by the radiologist.  ED MD interpretation: No cardiopulmonary abnormality on chest x-ray. No acute abnormalities on abd 2-view  Official radiology report(s): Dg Chest 2 View  Result Date: 03/09/2018 CLINICAL DATA:  Cough, fevers and chills. EXAM: CHEST - 2 VIEW COMPARISON:  Chest radiograph August 14, 2013 FINDINGS: Cardiomediastinal silhouette is normal. No pleural effusions or focal consolidations. Trachea projects midline and there is no pneumothorax. Old severe T12 compression fracture with mild focal kyphosis. IMPRESSION: No active cardiopulmonary disease. Electronically Signed   By: Elon Alas M.D.   On: 03/09/2018 14:17   Dg Abd 2 Views  Result Date: 03/09/2018 CLINICAL DATA:  Abdominal aching, cough, fever and chills for 4 days. EXAM: ABDOMEN - 2 VIEW COMPARISON:  CT 05/05/2017. FINDINGS: The bowel gas pattern is normal. There is  no bowel wall thickening, pneumatosis or free intraperitoneal air. No findings to suggest progressive ascites. Mild multilevel spondylosis noted. IMPRESSION: No acute abdominal findings. Electronically Signed   By: Richardean Sale M.D.   On: 03/09/2018 16:55    ____________________________________________   PROCEDURES  Critical Care performed: Yes, see critical care procedure note(s)   Procedure(s) performed:   .Critical Care Performed by: Hinda Kehr, MD Authorized by: Hinda Kehr, MD   Critical care provider statement:    Critical care time (minutes):  30   Critical care time was exclusive of:  Separately billable procedures and treating other patients   Critical care was necessary to treat or prevent imminent or life-threatening deterioration of the following conditions:  Sepsis   Critical care was time spent personally by me on the following activities:  Development of treatment plan with patient or surrogate, discussions with consultants, evaluation of patient's response to treatment, examination of patient, obtaining history from patient or surrogate, ordering and performing treatments and interventions, ordering and review of laboratory studies, ordering and review of radiographic studies, pulse oximetry, re-evaluation of patient's condition and review of old charts     ____________________________________________   INITIAL IMPRESSION / ASSESSMENT AND PLAN / ED COURSE  As part of my medical decision making, I reviewed the following data within the [facility] notes reviewed and incorporated, Labs reviewed , EKG interpreted , Old chart reviewed, Radiograph reviewed , Discussed with accepting physician  (Dr. Alita Chyle at [facility]), A phone consult was requested and obtained from this/these consultant(s) (Dr. Dorcas Mcmurray at [facility] Transplant service) and Notes from prior ED visits    Differential diagnosis includes, but is not limited to, viral illness including influenza-like syndrome or gastroenteritis, acute bacterial infection including but not limited to SBP or nonspecific colitis, worsening liver disease, dehydration.  The patient's lab work is notable for leukopenia of 2.7 but this is consistent with a CBC from about 10 months ago.  Lipase very slightly elevated, nonspecific but possibly secondary to her recent vomiting.  Troponin is negative.  INR is almost 2.  Conference of metabolic panel notable for hyponatremia 127 and potassium of 3.2 which is likely related to the volume depletion and the recent nausea and vomiting.  Total bilirubin is 4.7 which is consistent with prior records from [facility].  There is no evidence of any infection in her urine and her chest x-ray does not show any sign of pneumonia.  Given her comorbidities and history of liver failure I will contact Dr. Manuella Ghazi at [facility] to discuss the case and see if he advises empiric antibiotics.  She has no tenderness to palpation of her abdomen and no ascites amenable to paracentesis, but SBP is certainly possible.  She agrees with the plan.  I am giving normal saline 1 L IV and Zofran 4 mg IV.  Sepsis is possible but this could simply be a viral illness and I do not want to pull the trigger on empiric antibiotics without talking to her doctor first.  It is reassuring that she has no tenderness to palpation of the abdomen.  Clinical Course as of Mar 09 1926  Tue Mar 09, 2018  1541 Paging Dr. Dorcas Mcmurray at [facility] to discuss patient's symptoms and ask for recommendations.   [CF]  1623 I spoke by phone with Dr. Manuella Ghazi who knows the patient well.  We discussed the case in detail.  He agreed that most likely this is a persistent gastroenteritis given that I have  no  source of infection yet and that the abdominal pain is mild.  He does not recommend empiric antibiotics unless the patient seems to be decompensating.  He recommended adding on a pro time INR, checking MELD score after the INR is back, and reassessing with p.o. challenge after IV fluids and Zofran.  If the patient cannot tolerate p.o. fluids, she may need to be transferred to [facility] and he agreed that that would be appropriate and the best thing for the patient.  However if she can be discharged then he suggested she call the liver transplant coordinator to schedule a follow-up appointment.  I will discuss all this with the patient.  He also recommended acute abdomen series to look for any evidence of SBO or ileus which I have ordered.   [CF]  5956 Likely volume related, but may also represent sepsis (though not septic shock nor severe sepsis).  Rehydrated with IV fluids, awaiting radiograph results  Lactic Acid, Venous(!!): 2.0 [CF]  1702 MELD 20 (MELD-Na 27), higher than previous as per Dr. Manuella Ghazi, based largely on decreased sodium   [CF]  1703 No acute intra-abdominal findings.  DG Abd 2 Views [CF]  1707 Platelets(!): 71 [CF]  1709 Patient is still feeling ill and is willing to try some oral intake but still feels nauseated.  In the meantime, since my conversation with Dr. Manuella Ghazi, her lactic acid is come back elevated at 2 and she is persistently tachycardic in spite of the fluids.  I am worried that she meet sepsis criteria even without a specific source.  I have paged him again to discuss the case but I am leaning towards transfer to [facility] if he is agreeable and I will ask him again about starting empiric antibiotics as per sepsis protocol, for likely intra-abdominal infection.   [CF]  3875 Still awaiting callback from Dr. Manuella Ghazi at [facility]   [CF]  1806 Still no call back yet from Dr. Manuella Ghazi (for the second call).  Repeat vitals demonstrate that temperature is gone higher to 102.4 degrees oral and still tachycardic  at 120 even after a liter of fluids.  At this point I am calling code sepsis and we will treat with empiric Zosyn 3.375 g IV and give an additional 750 mL normal saline to bring the patient up to the 30 mL/kg goal.:  Still out to Dr. Manuella Ghazi, but I am initiating a transfer to [facility] as per my previous discussion with him, ED to ED, as per our prior discussion and recommendations.   [CF]  6433 Spoke by phone with Dr. Alberteen Spindle in the [facility] emergency department.  We discussed the case and he accepts the patient as an ED to ED transfer.  In the meantime I also spoke again with Dr. Manuella Ghazi who agreed with the transfer plan as well.  I have updated the patient.  She is hemodynamically stable for transfer.   [CF]  1851 Repeat lactic acid is come down slightly after fluids  Lactic Acid, Venous: 1.8 [CF]  1919 EMS here for transfer, patient stable for transport.   [CF]    Clinical Course User Index [CF] Hinda Kehr, MD    ____________________________________________  FINAL CLINICAL IMPRESSION(S) / ED DIAGNOSES  Final diagnoses:  Sepsis, due to unspecified organism [facility])  Chronic liver disease  Hyponatremia  Elevated lactic acid level     MEDICATIONS GIVEN DURING THIS VISIT:  Medications  sodium chloride 0.9 % bolus 1,000 mL (0 mLs Intravenous Stopped 03/09/18 1756)  ondansetron (  ZOFRAN) injection 4 mg (4 mg Intravenous Given 03/09/18 1548)  sodium chloride 0.9 % bolus 750 mL (0 mLs Intravenous Stopped 03/09/18 1925)  piperacillin-tazobactam (ZOSYN) IVPB 3.375 g (0 g Intravenous Stopped 03/09/18 1834)  ibuprofen (ADVIL,MOTRIN) tablet 600 mg (600 mg Oral Given 03/09/18 1822)     ED Discharge Orders    None       Note:  This document was prepared using Dragon voice recognition software and may include unintentional dictation errors.    Hinda Kehr, MD 03/09/18 [phone]

## 2018-03-09 NOTE — ED Notes (Signed)
Also says nausea for 3 weeks.  Not eating.

## 2018-03-09 NOTE — Progress Notes (Signed)
CODE SEPSIS - PHARMACY COMMUNICATION  **Broad Spectrum Antibiotics should be administered within 1 hour of Sepsis diagnosis**  Time Code Sepsis Called/Page Received: 1806  Antibiotics Ordered: Zosyn  Time of 1st antibiotic administration: 1822  Additional action taken by pharmacy: N/A  If necessary, Name of Provider/Nurse Contacted: N/A    Pernell Dupre ,PharmD Clinical Pharmacist  03/09/2018  6:25 PM

## 2018-03-09 NOTE — ED Notes (Signed)
Attempted to call report to [facility], no nurse able to take report at this time

## 2018-03-09 NOTE — ED Notes (Signed)
Patient transferred via [facility]. Patient's belongings sent home with husband.

## 2018-03-09 NOTE — ED Triage Notes (Signed)
Aching coughing fever and chills.  Since Friday.

## 2018-03-09 NOTE — ED Notes (Signed)
[facility]  TRANSFER  C[facility] C ALLED  PER  DR [facility] MD

## 2018-03-10 ENCOUNTER — Ambulatory Visit
Admit: 2018-03-10 | Discharge: 2018-03-11 | Disposition: A | Discharge status: Home / Self Care | Payer: PRIVATE HEALTH INSURANCE | Source: Other Acute Inpatient Hospital

## 2018-03-11 LAB — URINE CULTURE

## 2018-03-11 MED ORDER — BENZONATATE 100 MG CAPSULE
ORAL_CAPSULE | Freq: Four times a day (QID) | ORAL | 1 refills | 0.00000 days | Status: CP | PRN
Start: 2018-03-11 — End: 2018-05-05

## 2018-03-11 MED ORDER — DOXYCYCLINE HYCLATE 100 MG TABLET
ORAL_TABLET | Freq: Two times a day (BID) | ORAL | 0 refills | 0.00000 days | Status: CP
Start: 2018-03-11 — End: 2018-03-26

## 2018-03-14 LAB — CULTURE, BLOOD (ROUTINE X 2)
CULTURE: NO GROWTH
CULTURE: NO GROWTH
Specimen Description: ADEQUATE

## 2018-03-22 ENCOUNTER — Ambulatory Visit: Admit: 2018-03-22 | Discharge: 2018-03-23 | Payer: PRIVATE HEALTH INSURANCE

## 2018-03-22 DIAGNOSIS — Z01818 Encounter for other preprocedural examination: Principal | ICD-10-CM

## 2018-03-23 MED ORDER — OMEPRAZOLE 40 MG CAPSULE,DELAYED RELEASE
ORAL_CAPSULE | Freq: Two times a day (BID) | ORAL | 11 refills | 0.00000 days | Status: SS
Start: 2018-03-23 — End: 2018-05-04

## 2018-03-26 ENCOUNTER — Ambulatory Visit: Admit: 2018-03-26 | Discharge: 2018-03-27 | Payer: PRIVATE HEALTH INSURANCE

## 2018-03-26 DIAGNOSIS — K7460 Unspecified cirrhosis of liver: Principal | ICD-10-CM

## 2018-03-26 DIAGNOSIS — K754 Autoimmune hepatitis: Secondary | ICD-10-CM

## 2018-03-26 MED ORDER — CIPROFLOXACIN 500 MG TABLET
ORAL_TABLET | Freq: Every day | ORAL | 2 refills | 0 days | Status: CP
Start: 2018-03-26 — End: 2018-05-05

## 2018-04-09 ENCOUNTER — Ambulatory Visit: Admit: 2018-04-09 | Discharge: 2018-04-10 | Payer: PRIVATE HEALTH INSURANCE

## 2018-04-09 DIAGNOSIS — K7290 Hepatic failure, unspecified without coma: Principal | ICD-10-CM

## 2018-04-13 ENCOUNTER — Ambulatory Visit: Admit: 2018-04-13 | Discharge: 2018-04-14 | Payer: PRIVATE HEALTH INSURANCE

## 2018-04-13 DIAGNOSIS — K7460 Unspecified cirrhosis of liver: Principal | ICD-10-CM

## 2018-04-15 MED ORDER — SPIRONOLACTONE 50 MG TABLET
ORAL_TABLET | Freq: Every day | ORAL | 0 refills | 0 days
Start: 2018-04-15 — End: 2018-05-05

## 2018-04-15 MED ORDER — FUROSEMIDE 20 MG TABLET
ORAL_TABLET | Freq: Every day | ORAL | 0 refills | 0 days
Start: 2018-04-15 — End: 2018-05-05

## 2018-04-20 ENCOUNTER — Ambulatory Visit: Admit: 2018-04-20 | Discharge: 2018-04-21 | Payer: PRIVATE HEALTH INSURANCE

## 2018-04-20 DIAGNOSIS — K7290 Hepatic failure, unspecified without coma: Principal | ICD-10-CM

## 2018-04-20 MED ORDER — OMEPRAZOLE 40 MG CAPSULE,DELAYED RELEASE
ORAL_CAPSULE | 0 refills | 0 days | Status: SS
Start: 2018-04-20 — End: 2018-04-29

## 2018-04-26 ENCOUNTER — Ambulatory Visit: Admit: 2018-04-26 | Discharge: 2018-04-27 | Payer: PRIVATE HEALTH INSURANCE

## 2018-04-26 DIAGNOSIS — K7290 Hepatic failure, unspecified without coma: Principal | ICD-10-CM

## 2018-04-27 ENCOUNTER — Ambulatory Visit
Admit: 2018-04-27 | Discharge: 2018-05-05 | Disposition: A | Discharge status: Home / Self Care | Payer: PRIVATE HEALTH INSURANCE

## 2018-04-27 ENCOUNTER — Encounter
Admit: 2018-04-27 | Discharge: 2018-05-05 | Disposition: A | Discharge status: Home / Self Care | Payer: PRIVATE HEALTH INSURANCE

## 2018-04-27 ENCOUNTER — Institutional Professional Consult (permissible substitution)
Admit: 2018-04-27 | Discharge: 2018-05-05 | Disposition: A | Discharge status: Home / Self Care | Payer: PRIVATE HEALTH INSURANCE

## 2018-04-27 DIAGNOSIS — K7469 Other cirrhosis of liver: Principal | ICD-10-CM

## 2018-04-28 DIAGNOSIS — Z944 Liver transplant status: Principal | ICD-10-CM

## 2018-04-28 DIAGNOSIS — K7469 Other cirrhosis of liver: Principal | ICD-10-CM

## 2018-04-29 DIAGNOSIS — K7469 Other cirrhosis of liver: Principal | ICD-10-CM

## 2018-04-30 DIAGNOSIS — K7469 Other cirrhosis of liver: Principal | ICD-10-CM

## 2018-04-30 NOTE — Unmapped (Signed)
SICU Progress Note    Date of service: 04/30/2018    Hospital Day:  LOS: 3 days   Surgery Date(s): 04/28/2018  Admitting Surgical Attending: Phillips Grout Des*  ICU Attending: Dr. Ruben Im    Interval History:   No acute events overnight. Patient appeared sedated yesterday but appears unrelated to narcotic use as she received minimal narcotic and did not improve after discontinuing PCA. Repeat labs and STAT liver US were unremarkable. Patient stable on [place]. PCA restarted last night for pain control. Patient is appropriate for transfer to stepdown unit.       Assessment/Plan:    77 yoF w/ multiple comorbidities including autoimmune hepatitis, hypothyroidism, RA, OSA, GERD & HTN who underwent deceased donor transplantation on 04/28/18. Admitted to SICU intubated and sedated.     Neurological:    Acute Pain     Plan:   - Dilaudid PCA 0.2 q10  - PO oxy 5 q4 PRN      Cardiovascular:     No acute issues    Plan:   - MAP goal >70       Pulmonary:     hypoxia    Plan:   - stable on [place], wean O2  - IS, OOB, PT      Renal/Genitourinary:    No acute issues    Plan:   - lasix 20 IV x1  - Cont foley while diuresing, d/c this PM  - Monitor urine output    GI/Nutrition:            BMI: Body mass index is 21.94 kg/m??.    Hepatic failure: (chronic) autoimmune hepatitis, s/p liver transplant 7/17    Plan:   F: KVO  E: Replace electrolytes PRN  N: CLD  - PPI  ??  *s/p Transplant  - Trend labs per protocol  - D/c Alprostil gtt  - D/c Acetylcystein gtt   - s/p Zosyn  - Albumin 25% 25g x1 today with lasix  - Steroid Taper per Transplant  - Mycophenolate, Nystatin, Bactrim, Tacrolimus, Valganciclovir  - ursodiol 300 mg TID  - Liver Ultrasound 7/17 with patent vasculature, 3.5cm hematoma, mild reduced res index in L hep artery, repeat 7/19 ok    Heme:     Acute blood loss anemia due to (Post-op blood loss anemia)    Plan:   - H/H Stable, s/p 6u PRBCs, 4u FFP in OR  - heparin DVT ppx  - neutropenic at baseline    ID:  Prophylaxis s/p liver transplant    Plan:   - Prophylactic Nystatin, Bactrim, Valganciclovir    Endocrine:     hyperglycemia    Plan:   - D/c SSI       MSK/Integument:    Rheumatoid arthritis    Plan   ??- no need to restart RA meds currently  - PT        Daily Care Checklist:            Stress Ulcer Prevention:Yes, Glucocorticoid therapy           DVT Prophylaxis: Chemical:  Yes: Heparin BID and Mechanical: Yes.    Antibiotics reviewed  yes           HOB > 30 degrees: yes             Daily Awakening:  Yes           Spontaneous Breathing Trial: yes           Continued Beta  Blockade:  no           Continued need for central/PICC line : no - place order to remove           Continue urinary catheter for: yes  strict intake and output, d/c this PM           Restraint orders needed?: NO           Other tubes/lines/drains:            Activity/Mobility: Early Mobility, PT and OOB in Chair    Deescalate labs or x-rays:  yes            Advanced Care Planning : Full Code           Disposition: transfer to intermediate care     ATTESTATION    I saw and examined the patient with the resident and agree with the evaluation and plan in the resident note. Renan Danese    Objective:    Physical Exam:    Gen: NAD. Awake but drowsy. Endorses pain but controlled with PCA.  CV: RRR, no murmurs. No peripheral edema.  Pulm: NWB on 6L [place]. CTAB, good air movement throughout.  Abd: no bowel sounds. Mildly firm LLQ, non tender. No rebound or guarding. Mercedes incision without erythema or drainage, well approximated. 2 RLQ drains with minimal output, one serous, one serosanguinous.   Neuro: A&O x3. Sensation grossly intact. Moves 4/4 limbs spontaneously.     Data Review:   I have reviewed the labs and studies from the last 24 hours.    Vitals Reviewed:    Temp:  [36.7 ??C-36.9 ??C] 36.8 ??C  Heart Rate:  [94-110] 98  SpO2 Pulse:  [94-109] 96  Resp:  [7-22] 11  BP: (132-156)/(61-106) 139/73  MAP (mmHg):  [79-121] 94  A BP-1: (116-129)/(90-103) 118/99  MAP:  [103 mmHg-115 mmHg] 108 mmHg  FiO2 (%):  [44 %] 44 %  SpO2:  [93 %-99 %] 97 %   Temp (24hrs), Avg:36.8 ??C, Min:36.7 ??C, Max:36.9 ??C     SpO2: 97 %   Height: 152 cm (4' 11.84)    Weight: 50.7 kg (111 lb 12.4 oz)    Body mass index is 21.94 kg/m??.    Body surface area is 1.46 meters squared.       Intake/Output Summary (Last 24 hours) at 04/30/2018 1147  Last data filed at 04/30/2018 1000  Gross per 24 hour   Intake 1303.4 ml   Output 1218 ml   Net 85.4 ml        I/O last 3 completed shifts:  In: 2867.1 [I.V.:1952.1; IV Piggyback:915]  Out: 1863 [Urine:1680; Drains:183]   I/O this shift:  In: 200 [I.V.:200]  Out: 190 [Urine:110; Drains:80]      Continuous Infusions:   ??? HYDROmorphone in 0.9 % NaCl           Hemodynamic/Invasive Device Data (24 hrs):  A BP-1: (116-129)/(90-103) 118/99  MAP:  [103 mmHg-115 mmHg] 108 mmHg            Ventilation/Oxygen Therapy (24hrs):  FiO2 (%):  [44 %] 44 %  O2 Device: Nasal cannula  O2 Flow Rate (L/min):  [6 L/min] 6 L/min    Tubes and Drains:  Patient Lines/Drains/Airways Status    Active Active Lines, Drains, & Airways     Name:   Placement date:   Placement time:   Site:   Days:    CVC Double Lumen 04/28/18 Non-tunneled Right Internal jugular  04/28/18    0028    Internal jugular   2    Closed/Suction Drain 1 Right RLQ Bulb 10 Fr.   04/28/18    0525    RLQ   2    Closed/Suction Drain 2 Right RLQ Bulb 10 Fr.   04/28/18    0525    RLQ   2    Urethral Catheter Non-latex 6 Fr.   04/28/18    0045    Non-latex   2    Peripheral IV 04/27/18 Anterior;Distal;Right Forearm   04/27/18    1545    Forearm   2    Peripheral IV 04/28/18 Right Forearm   04/28/18    0038    Forearm   2    Peripheral IV 04/28/18 Left Forearm   04/28/18    0032    Forearm   2

## 2018-05-02 DIAGNOSIS — K7469 Other cirrhosis of liver: Principal | ICD-10-CM

## 2018-05-04 MED ORDER — PROGRAF 1 MG CAPSULE: 9 mg | capsule | Freq: Two times a day (BID) | 11 refills | 0 days | Status: AC

## 2018-05-04 MED ORDER — OMEPRAZOLE 40 MG CAPSULE,DELAYED RELEASE: 40 mg | capsule | Freq: Two times a day (BID) | 0 refills | 0 days | Status: AC

## 2018-05-04 MED ORDER — MYCOPHENOLATE SODIUM 180 MG TABLET,DELAYED RELEASE
ORAL_TABLET | Freq: Two times a day (BID) | ORAL | 11 refills | 0.00000 days | Status: CP
Start: 2018-05-04 — End: 2018-05-10

## 2018-05-04 MED ORDER — DOCUSATE SODIUM 100 MG CAPSULE
ORAL_CAPSULE | Freq: Two times a day (BID) | ORAL | 0 refills | 0.00000 days | Status: CP | PRN
Start: 2018-05-04 — End: 2018-05-15

## 2018-05-04 MED ORDER — URSODIOL 300 MG CAPSULE
ORAL_CAPSULE | Freq: Two times a day (BID) | ORAL | 0 refills | 0 days | Status: CP
Start: 2018-05-04 — End: 2018-05-19

## 2018-05-04 MED ORDER — DOCUSATE SODIUM 100 MG CAPSULE: 100 mg | capsule | Freq: Two times a day (BID) | 0 refills | 0 days | Status: AC

## 2018-05-04 MED ORDER — POLYETHYLENE GLYCOL 3350 17 GRAM ORAL POWDER PACKET
Freq: Every day | ORAL | 0 refills | 0.00000 days | Status: CP | PRN
Start: 2018-05-04 — End: 2018-05-13

## 2018-05-04 MED ORDER — MAGNESIUM OXIDE-MAGNESIUM AMINO ACID CHELATE 133 MG TABLET
ORAL_TABLET | Freq: Two times a day (BID) | ORAL | 11 refills | 0.00000 days | Status: CP
Start: 2018-05-04 — End: 2018-05-05

## 2018-05-04 MED ORDER — PROGRAF 1 MG CAPSULE
ORAL_CAPSULE | Freq: Two times a day (BID) | ORAL | 11 refills | 0.00000 days | Status: CP
Start: 2018-05-04 — End: 2018-05-04

## 2018-05-04 MED ORDER — ASPIRIN 81 MG TABLET,DELAYED RELEASE: 81 mg | tablet | 11 refills | 0 days

## 2018-05-04 MED ORDER — PREDNISONE 5 MG TABLET: tablet | 11 refills | 0 days | Status: AC

## 2018-05-04 MED ORDER — GOLIMUMAB 50 MG/0.5 ML SUBCUTANEOUS PEN INJECTOR
0 refills | 0 days
Start: 2018-05-04 — End: 2018-08-02

## 2018-05-04 MED ORDER — MAGNESIUM OXIDE-MAGNESIUM AMINO ACID CHELATE 133 MG TABLET: each | 11 refills | 0 days

## 2018-05-04 MED ORDER — ASPIRIN 81 MG TABLET,DELAYED RELEASE
ORAL_TABLET | Freq: Every day | ORAL | 11 refills | 0.00000 days | Status: CP
Start: 2018-05-04 — End: 2018-05-04

## 2018-05-04 MED ORDER — VALGANCICLOVIR 450 MG TABLET: 450 mg | tablet | Freq: Every day | 2 refills | 0 days | Status: AC

## 2018-05-04 MED ORDER — OXYCODONE 5 MG TABLET
ORAL_TABLET | Freq: Four times a day (QID) | ORAL | 0 refills | 0 days | Status: CP | PRN
Start: 2018-05-04 — End: 2018-05-13
  Filled 2018-05-28: qty 100, 30d supply, fill #0

## 2018-05-04 MED ORDER — OMEPRAZOLE 40 MG CAPSULE,DELAYED RELEASE
ORAL_CAPSULE | Freq: Two times a day (BID) | ORAL | 11 refills | 0.00000 days | Status: CP
Start: 2018-05-04 — End: 2018-05-04

## 2018-05-04 MED ORDER — POLYETHYLENE GLYCOL 3350 17 GRAM/DOSE ORAL POWDER
0 refills | 0 days
Start: 2018-05-04 — End: 2018-05-27

## 2018-05-04 MED ORDER — MYFORTIC 180 MG TABLET,DELAYED RELEASE
ORAL_TABLET | Freq: Two times a day (BID) | ORAL | 11 refills | 0.00000 days | Status: CP
Start: 2018-05-04 — End: 2018-05-04

## 2018-05-04 MED ORDER — OMEPRAZOLE 40 MG CAPSULE,DELAYED RELEASE: 40 mg | capsule | Freq: Two times a day (BID) | 11 refills | 0 days | Status: AC

## 2018-05-04 MED ORDER — OMEPRAZOLE 40 MG CAPSULE,DELAYED RELEASE: 40 mg | capsule | 11 refills | 0 days

## 2018-05-04 MED ORDER — GABAPENTIN 100 MG CAPSULE
ORAL_CAPSULE | Freq: Three times a day (TID) | ORAL | 0 refills | 0.00000 days | Status: CP
Start: 2018-05-04 — End: 2018-05-04

## 2018-05-04 MED ORDER — GABAPENTIN 100 MG CAPSULE: 100 mg | capsule | Freq: Three times a day (TID) | 0 refills | 0 days | Status: AC

## 2018-05-04 MED ORDER — PREDNISONE 5 MG TABLET
ORAL_TABLET | ORAL | 11 refills | 0.00000 days | Status: CP
Start: 2018-05-04 — End: 2018-05-13

## 2018-05-04 MED ORDER — ACETAMINOPHEN 325 MG TABLET: 650 mg | tablet | 2 refills | 0 days | Status: AC

## 2018-05-04 MED ORDER — SULFAMETHOXAZOLE 400 MG-TRIMETHOPRIM 80 MG TABLET: 1 | tablet | 5 refills | 0 days | Status: AC

## 2018-05-04 MED ORDER — ACETAMINOPHEN 325 MG TABLET
ORAL_TABLET | ORAL | 2 refills | 0.00000 days | Status: CP | PRN
Start: 2018-05-04 — End: 2018-05-04

## 2018-05-04 MED ORDER — MYCOPHENOLATE SODIUM 180 MG TABLET,DELAYED RELEASE: 360 mg | tablet | 11 refills | 0 days

## 2018-05-04 MED ORDER — VALGANCICLOVIR 450 MG TABLET
ORAL_TABLET | Freq: Every day | ORAL | 2 refills | 0.00000 days | Status: CP
Start: 2018-05-04 — End: 2018-05-04
  Filled 2018-05-28: qty 30, 30d supply, fill #0

## 2018-05-04 MED ORDER — MYFORTIC 180 MG TABLET,DELAYED RELEASE: 360 mg | tablet | Freq: Two times a day (BID) | 11 refills | 0 days | Status: AC

## 2018-05-04 MED FILL — ASPIRIN 81MG (BOTTLE)/81MG EC/TBEC: ASPIRIN 81MG (BOTTLE)/81MG EC/TBEC | 120 days supply | Qty: 1 | Fill #0

## 2018-05-04 MED FILL — VALGANCICLOVIR/450MG/TABS: VALGANCICLOVIR/450MG/TABS | 30 days supply | Qty: 30 | Fill #0

## 2018-05-04 MED FILL — SULFAMETHOXAZOLE/TRIMETHO/400-80MG/TABS: SULFAMETHOXAZOLE/TRIMETHO/400-80MG/TABS | 28 days supply | Qty: 12 | Fill #0

## 2018-05-04 MED FILL — DOCUSATE/100MG/CAPS: DOCUSATE/100MG/CAPS | 30 days supply | Qty: 60 | Fill #0

## 2018-05-04 MED FILL — PROGRAF/1MG/CAP: PROGRAF/1MG/CAP | 30 days supply | Qty: 540 | Fill #0

## 2018-05-04 MED FILL — PREDNISONE/5MG/TAB: PREDNISONE/5MG/TAB | 32 days supply | Qty: 35 | Fill #0

## 2018-05-04 MED FILL — POLYETHYLENE GLYCOL 3350/3350 NF/POWD: POLYETHYLENE GLYCOL 3350/3350 NF/POWD | 30 days supply | Qty: 1 | Fill #0

## 2018-05-04 MED FILL — URSODIOL/300MG/CAPS: URSODIOL/300MG/CAPS | 30 days supply | Qty: 60 | Fill #0

## 2018-05-04 MED FILL — MYCOPHENOLIC ACID DR/180MG DR/TBEC: MYCOPHENOLIC ACID DR/180MG DR/TBEC | 30 days supply | Qty: 120 | Fill #0

## 2018-05-04 MED FILL — ACETAMINOPHEN/325MG/TABS: ACETAMINOPHEN/325MG/TABS | 8 days supply | Qty: 100 | Fill #0

## 2018-05-04 MED FILL — OXYCODONE HCL/5MG/TABS: OXYCODONE HCL/5MG/TABS | 6 days supply | Qty: 50 | Fill #0

## 2018-05-05 MED ORDER — MAGNESIUM OXIDE 400 MG (241.3 MG MAGNESIUM) TABLET
Freq: Two times a day (BID) | ORAL | 0 refills | 0.00000 days
Start: 2018-05-05 — End: 2018-05-13

## 2018-05-05 MED ORDER — SULFAMETHOXAZOLE 400 MG-TRIMETHOPRIM 80 MG TABLET
ORAL | 5 refills | 0.00000 days | Status: CP
Start: 2018-05-05 — End: 2018-11-30
  Filled 2018-05-28: qty 12, 28d supply, fill #0

## 2018-05-05 MED ORDER — OMEPRAZOLE 40 MG CAPSULE,DELAYED RELEASE: 40 mg | capsule | 0 refills | 0 days | Status: AC

## 2018-05-05 MED ORDER — OMEPRAZOLE 40 MG CAPSULE,DELAYED RELEASE
ORAL_CAPSULE | Freq: Two times a day (BID) | ORAL | 0 refills | 0.00000 days | Status: CP
Start: 2018-05-05 — End: 2019-01-06

## 2018-05-05 NOTE — Unmapped (Addendum)
Patient's explant reviewed in pathology today by Dr.Woosley in presence of Dr.Desai, Dr.Hayashi, Dr.Barritt and Dr.Darling. Incidental cyst and hemangioma found. Cirrhosis consistent with AIH. No zero time bx completed on dcd donor liver. Dr.Hayashi requested patient's bx be obtained and reviewed path conference. Contacted [facility] who said the slides were transported to [facility] with the donor liver. Contacted [place] surg.path.but they had not received the slides. Reached out to [facility] again. Per Lyna Poser [phone],) slides were retrieved and in mail to txp dept now.

## 2018-05-05 NOTE — Unmapped (Signed)
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 *      OR Procedures:    LIVER ALLOTRANSPLANTATION; ORTHOTOPIC, PARTIAL OR WHOLE, FROM CADAVER OR LIVING DONOR, ANY AGE  BACKBNCH STD PREP CAD DONOR WHOLE LIVER GFT PRIOR TNSPLNT,INC CHOLE,DISS/REM SURR TISSU WO TRISEG/LOBE SPLT  Date  04/27/2018  -------------------     Ancillary Procedures: no procedures    Discharge Day Services: the patient was seen and evaluated by transplant surgery and deemed suitable for discharge.     Subjective   No acute events overnight. Pain Controlled. No fever or chills.    Objective   Patient Vitals for the past 8 hrs:   BP Temp Temp src Pulse Resp SpO2   05/05/18 1000 ??? ??? ??? 106 ??? ???   05/05/18 0732 115/56 37.8 ??C (100 ??F) Oral 105 17 95 %   05/05/18 0401 117/59 37.9 ??C (100.2 ??F) Oral 100 16 98 %     I/O this shift:  In: -   Out: 100 [Urine:100]    General Appearance:   No acute distress  Lungs:                Clear to auscultation bilaterally  Heart:                           Regular rate and rhythm  Abdomen:                Soft, appropriately tender to palpation, incision c/d/i with staples  Extremities:              Warm and well perfused      Hospital Course:  Sara Sanders is a 62 y.o. 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 There were no surgical complications. She was taken to SICU postoperatively for critical care management during recovery, per protocol. Liver US on same day of transplant demonstrated patent hepatic transplant vasculature; mildly reduced resistive index in left hepatic artery; otherwise normal resistive indices; small mildly complex fluid collection inferior to left hepatic lobe. She was extubated and ng tube removed on 04/29/18. CXR on POD1 demonstrated increased bibasilar atelectasis; persistent mild pulmonary vascular congestion and interstitial edema; no pneumothorax. She was started on IV Lasix and advised to continue IS. She was transferred to ISCU on POD2. Her liver transaminases plateaued at elevated levels, most likely due to transient hypovolemia. Liver US on POD4 demonstrated patent hepatic vasculature; stable resistive indices, within normal limits. Repeated CXR on POD4 demonstrated a slight improved aeration of the right lung with persistent small pleural fluid and a clear  left lung. Her oxygen was discontinued 05/02/18. She continued to recover well, tolerating a regular diet, with adequate pain control with PO medications. PT evaluated with recommendation for home walker and therapy 3x/ week, which the patient refused. Immunosuppression was monitored and adjusted appropriately inpatient. She will be discharged on 05/05/2108 without home health care.      Condition at Discharge: Improved  Discharge Medications:      Medication List      START taking these medications    acetaminophen 325 MG tablet  Commonly known as:  TYLENOL  Take 2 tablets (650 mg total) by mouth every four (4) hours as needed for   pain.     aspirin 81 MG tablet  Commonly known as:  ECOTRIN  Take 1 tablet (81 mg total) by mouth daily.     docusate sodium 100 MG capsule  Commonly known as:  COLACE  Take 1 capsule (100 mg total) by mouth two (2) times a day as needed for   constipation.     mycophenolate 180 MG EC tablet  Commonly known as:  MYFORTIC  Take 2 tablets (360 mg total) by mouth Two (2) times a day.     oxyCODONE 5 MG immediate release tablet  Commonly known as:  ROXICODONE  Take 1-2 tablets (5-10 mg total) by mouth every six (6) hours as needed.   for up to 7 days     polyethylene glycol 17 gram packet  Commonly known as:  MIRALAX  Take 17 g by mouth daily as needed.     PROGRAF 1 MG capsule  Generic drug:  tacrolimus  Take 9 capsules (9 mg total) by mouth two (2) times a day.     sulfamethoxazole-trimethoprim 400-80 mg per tablet  Commonly known as:  BACTRIM,SEPTRA  Take 1 tablet (80 mg of trimethoprim total) by mouth 3 (three) times a   week.     ursodiol 300 mg capsule  Commonly known as:  ACTIGALL  Take 1 capsule (300 mg total) by mouth Two (2) times a day.     valGANciclovir 450 mg tablet  Commonly known as:  VALCYTE  Take 1 tablet (450 mg total) by mouth daily.        CHANGE how you take these medications    golimumab 50 mg/0.5 mL Pnij pen injector  Commonly known as:  SIMPONI  HOLD  What changed:    how much to take  how to take this  when to take this  additional instructions     magnesium oxide 400 mg (241.3 mg magnesium) tablet  Commonly known as:  MAG-OX  Take 1 tablet (400 mg total) by mouth Two (2) times a day.  What changed:  when to take this     predniSONE 5 MG tablet  Commonly known as:  DELTASONE  7/24: 15mg  (3 tabs) at 9AM. 7/25: 10mg  (2 tabs) at 9AM. 7/26 onward: 5mg    (1 tab) daily.  What changed:    how much to take  how to take this  when to take this  additional instructions        CONTINUE taking these medications    calcium carbonate-vitamin D3 600 mg(1,500mg ) -200 unit per tablet     levothyroxine 75 MCG tablet  Commonly known as:  SYNTHROID, LEVOTHROID     omeprazole 40 MG capsule  Commonly known as:  PriLOSEC  Take 1 capsule (40 mg total) by mouth Two (2) times a day.  STOP taking these medications    b complex vitamins capsule     benzonatate 100 MG capsule  Commonly known as:  TESSALON     ciprofloxacin HCl 500 MG tablet  Commonly known as:  CIPRO     ferrous sulfate 325 (65 FE) MG tablet     furosemide 20 MG tablet  Commonly known as:  LASIX     mercaptopurine 50 mg tablet  Commonly known as:  PURINETHOL     potassium chloride SA 20 MEQ tablet  Commonly known as:  K-DUR,KLOR-CON     spironolactone 50 MG tablet  Commonly known as:  ALDACTONE     traMADol 50 mg tablet  Commonly known as:  ULTRAM          Pending Test Results:     Discharge Instructions:  Activity:     Diet:    Other Instructions:    Labs and Other Follow-ups after Discharge:  Follow Up instructions and Outpatient Referrals     Discharge instructions      Activity: Do not lift > 10-15 lbs for 1st 6 weeks, then gradually increase to 25 lbs over the following 6 weeks. Resume heavy lifting only after being cleared to do so at follow-up appointment in Transplant Surgery clinic.    Diet: Regular    Your Post-Transplant Coordinator is Emilio Math. Contact your transplant coordinator or the Transplant Surgery Office [phone]) during business hours or page the transplant coordinator on call [phone]) after business hours for:    - fever >100.5 degrees F by mouth, any fever with shaking chills, or other signs or symptoms of infection   - uncontrolled nausea, vomiting, or diarrhea; inability to have a bowel movement for > 3 days.   - any problem that prevents taking medications as scheduled.   - pain uncontrolled with prescribed medication or new pain or tenderness at the surgical site   - sudden weight gain or increase in blood pressure (greater than 140/85)   - shortness of breath, chest pain / discomfort   - new or increasing jaundice   - urinary symptoms including pain / difficulty / burning or tea-colored urine   - any other new or concerning symptoms   - questions regarding your medications or continuing care      Patient may shower, but should not immerse wounds in bath or pool for 2-3 weeks. Wash the surgical site with mild soap and water, but do not scrub vigorously.    You may dress wounds with dry gauze and paper tape to avoid soilage.    Do not drive or operate heavy machinery prior to MD clearance, or at any time while taking narcotics.    Inspect surgical sites at least twice daily, contact Transplant Coordinator for spreading redness, purulent discharge, or increasing bleeding or drainage, or for separation of wounds.     Maintain a written record of daily vital signs, per Handbook instructions.     Maintain a written record of medications taken and review against the discharge medications sheet (orange paper). Periodically review your Transplant Handbook for important information regarding postoperative care and required precautions.      Labs and Other Follow-ups after Discharge:   Liver  Labs 3x week: Ca, Mag, Phos, Na, K, CI, CO2, BUN, Creatinine, Glucose, CBC, Tacrolimus trough, AST, ALT, GGT, AP & T. Bilirubin    Coordinator:  Emilio Math [phone] fax [phone]             Resources and  Referrals     Walker      Type:  Rolling    Wheels:  3 Fixed    Length of Need:  3 months    Patient needs RW post liver transplant.             Future Appointments:  Appointments which have been scheduled for you    May 10, 2018  9:00 AM EDT  (Arrive by 8:30 AM)  LAB ONLY with SURTRA LAB ONLY  [facility] TRANSPLANT SURGERY [facility] REGION) [address]  [place] [address]  [phone]      May 10, 2018 10:20 AM EDT  (Arrive by 9:50 AM)  Nila Nephew W MD with Ruth-Ann Maryln Manuel, CPP  [facility] TRANSPLANT SURGERY [facility] REGION) [address]  [place] [address]  [phone]      May 10, 2018  1:45 PM EDT  (Arrive by 1:15 PM)  RETURN 15 with Chirag Lanney Gins, MD  [facility] TRANSPLANT SURGERY [facility] REGION) [address]  [place] [address]  [phone]      Jun 22, 2018  UGI ENDOSCOPY; WITH BIOPSY, SINGLE OR MULTIPLE with Pia Mau, MD  GI PERIOP [facility] REGION) [address]  [place] [address]  [phone]    Additional instructions:      The below services have been ordered for you by your medical team to help your health and safety at home.    DME Agency: [facility]  Phone:  [phone]  To Be Delivered:  bedside  Equipment: RW  Special Instructions:  none    Other: na    Please contact your Post-Transplant Coordinator if you have any problems/concerns:  Emilio Math [phone]; [phone]

## 2018-05-06 ENCOUNTER — Ambulatory Visit
Admit: 2018-05-06 | Discharge: 2018-05-06 | Payer: PRIVATE HEALTH INSURANCE | Attending: Student in an Organized Health Care Education/Training Program | Primary: Student in an Organized Health Care Education/Training Program

## 2018-05-06 DIAGNOSIS — B999 Unspecified infectious disease: Principal | ICD-10-CM

## 2018-05-06 MED ORDER — CIPROFLOXACIN 500 MG TABLET
ORAL_TABLET | Freq: Two times a day (BID) | ORAL | 0 refills | 0 days | Status: CP
Start: 2018-05-06 — End: 2018-05-13

## 2018-05-06 NOTE — Unmapped (Signed)
Patient paged on call coordinator to relay she woke up with blood on her gown from left side of incision.  She notes it appeared to be fresh blood on the gown less than the size of a dollar bill.  She notes there is no active drainage from the incision site at this time but did not irriaitation/itching around the same area earlier in the evening.      Advised patient to apply a dry dressing to the site and monitor overnight - discussed possible irritation described @ incision site that precipitated this.  She will notify primary coordinator if draininge is noted again in the morning.      She is aware if additional symptoms arise and or bleeding continues she should alert on call coordinator overnight.      Encouraged patient to take picture if gown if incision noted with drainage again and can sent to primary coordinator via MyChart account for assessment.    Patient will f/u with call to primary coordinator tomorrow during office hours to update on status of incision.

## 2018-05-07 ENCOUNTER — Ambulatory Visit: Admit: 2018-05-07 | Discharge: 2018-05-08 | Payer: PRIVATE HEALTH INSURANCE

## 2018-05-07 DIAGNOSIS — Z944 Liver transplant status: Principal | ICD-10-CM

## 2018-05-07 DIAGNOSIS — Z79899 Other long term (current) drug therapy: Secondary | ICD-10-CM

## 2018-05-07 LAB — FSAB CLASS 1 ANTIBODY SPECIFICITY

## 2018-05-07 LAB — MEAN PLATELET VOLUME: Lab: 7.7

## 2018-05-07 LAB — COMPREHENSIVE METABOLIC PANEL
ALBUMIN: 3.5 g/dL (ref 3.5–5.0)
ALKALINE PHOSPHATASE: 145 U/L — ABNORMAL HIGH (ref 38–126)
ALT (SGPT): 73 U/L — ABNORMAL HIGH (ref 15–48)
ANION GAP: 10 mmol/L (ref 9–15)
AST (SGOT): 24 U/L (ref 14–38)
BILIRUBIN TOTAL: 1.4 mg/dL — ABNORMAL HIGH (ref 0.0–1.2)
BLOOD UREA NITROGEN: 17 mg/dL (ref 7–21)
BUN / CREAT RATIO: 27
CALCIUM: 8.7 mg/dL (ref 8.5–10.2)
CHLORIDE: 97 mmol/L — ABNORMAL LOW (ref 98–107)
CO2: 24 mmol/L (ref 22.0–30.0)
CREATININE: 0.64 mg/dL (ref 0.60–1.00)
EGFR CKD-EPI AA FEMALE: >90 mL/min/{1.73_m2} (ref >=60)
EGFR CKD-EPI NON-AA FEMALE: >90 mL/min/{1.73_m2} (ref >=60)
GLUCOSE RANDOM: 91 mg/dL (ref 65–179)
POTASSIUM: 3.8 mmol/L (ref 3.5–5.0)
PROTEIN TOTAL: 6.1 g/dL — ABNORMAL LOW (ref 6.5–8.3)

## 2018-05-07 LAB — CBC W/ AUTO DIFF
BASOPHILS ABSOLUTE COUNT: 0 10*9/L (ref 0.0–0.1)
BASOPHILS RELATIVE PERCENT: 0.8 %
EOSINOPHILS ABSOLUTE COUNT: 0.1 10*9/L (ref 0.0–0.4)
EOSINOPHILS RELATIVE PERCENT: 2.8 %
LARGE UNSTAINED CELLS: 2 % (ref 0–4)
LYMPHOCYTES RELATIVE PERCENT: 10.3 %
MEAN CORPUSCULAR HEMOGLOBIN CONC: 33.4 g/dL (ref 31.0–37.0)
MEAN CORPUSCULAR HEMOGLOBIN: 34.9 pg — ABNORMAL HIGH (ref 26.0–34.0)
MEAN CORPUSCULAR VOLUME: 104.5 fL — ABNORMAL HIGH (ref 80.0–100.0)
MEAN PLATELET VOLUME: 7.7 fL (ref 7.0–10.0)
MONOCYTES ABSOLUTE COUNT: 0.2 10*9/L (ref 0.2–0.8)
MONOCYTES RELATIVE PERCENT: 3.6 %
NEUTROPHILS ABSOLUTE COUNT: 3.9 10*9/L (ref 2.0–7.5)
NEUTROPHILS RELATIVE PERCENT: 81 %
PLATELET COUNT: 179 10*9/L (ref 150–440)
RED BLOOD CELL COUNT: 3.19 10*12/L — ABNORMAL LOW (ref 4.00–5.20)
RED CELL DISTRIBUTION WIDTH: 19.8 % — ABNORMAL HIGH (ref 12.0–15.0)
WBC ADJUSTED: 4.9 10*9/L (ref 4.5–11.0)

## 2018-05-07 LAB — PROTEIN TOTAL: Protein:MCnc:Pt:Ser/Plas:Qn:: 6.1 — ABNORMAL LOW

## 2018-05-07 LAB — TACROLIMUS BLOOD: Lab: 8.9

## 2018-05-07 LAB — BILIRUBIN DIRECT: Bilirubin.glucuronidated:MCnc:Pt:Ser/Plas:Qn:: 0.8 — ABNORMAL HIGH

## 2018-05-07 LAB — MAGNESIUM: Magnesium:MCnc:Pt:Ser/Plas:Qn:: 1.9

## 2018-05-07 LAB — PHOSPHORUS: Phosphate:MCnc:Pt:Ser/Plas:Qn:: 4.3

## 2018-05-07 LAB — GAMMA GLUTAMYL TRANSFERASE: Gamma glutamyl transferase:CCnc:Pt:Ser/Plas:Qn:: 170 — ABNORMAL HIGH

## 2018-05-07 LAB — HLA CL1 ANTIBODY COMM: Lab: 0

## 2018-05-07 LAB — SMEAR REVIEW

## 2018-05-07 NOTE — Unmapped (Addendum)
8/1 update: per 7/31 referral, pt is unable to fill specialty medications at [facility]. I will forward referral to clinic so they know where to send new rxs. I also tried to reach pt to let  Her know of this, as well as to see if she wants to keep other meds with [place], or have clinic send ALL rxs to [facility] - had to LVM      [facility]   Patient Onboarding/Medication Counseling    [name] is a 62 y.o. female with recent liver transplant who I am counseling today on initiation of therapy. Patient states was counseled extensively as inpatient and at clinic and declines education today.    Medication: mycophenolate, prograf, ursodiol, valganciclovir    Verified patient's date of birth / HIPAA.      Education Provided: ??    Dose/Administration discussed: patient declined education today.     Storage requirements: patient declined education today.    Side effects / precautions discussed: patient declined education today.  Patient will receive a drug information handout with shipment.    Handling precautions / disposal reviewed:  patient declined education today..    Drug Interactions: other medications reviewed and up to date in Epic.  No drug interactions identified.patient declined education today.    Comorbidities/Allergies: reviewed and up to date in Epic.    Verified therapy is appropriate and should continue      Delivery Information    Medication Assistance provided: none    Anticipated copay of $0 on all 4 meds reviewed with patient. Verified delivery address in [facility] and reviewed medication storage requirement.    Scheduled delivery date: patient declined delivery at this time, requests call back in 3 weeks.    Explained that we ship using UPS or courier and this shipment will not require a signature.      Explained the services we provide at [facility] and that each month we would call to set up refills.  Stressed importance of returning phone calls so that we could ensure they receive their medications in time each month.  Informed patient that we should be setting up refills 7-10 days prior to when they will run out of medication.  Informed patient that welcome packet will be sent.      Patient verbalized understanding of the above information as well as how to contact the pharmacy at [phone] option 4 with any questions/concerns.  The pharmacy is open Monday through Friday 8:30am-4:30pm.  A pharmacist is available 24/7 via pager to answer any clinical questions they may have.        Patient Specific Needs      ? Patient has no physical, cognitive, or cultural barriers.    ? Patient prefers to have medications discussed with  Patient     ? Patient is able to read and understand education materials at a high school level or above.    ? Patient's primary language is  English           Thad Ranger  [facility] Specialty Pharmacist

## 2018-05-07 NOTE — Unmapped (Signed)
TRANSPLANT SURGERY CLINIC  PROGRESS NOTE    Patient Name: Sara Sanders  Patient Age: 62 y.o.  Encounter Date: 05/06/2018    REFERRING PHYSICIAN:  Leotis Shames, MD  [address]  [facility] [place], [place] [address]    CONSULTING PHYSICIANS:  Patient Care Team:  Leotis Shames, MD as PCP - General (Internal Medicine)  Midge Minium, MD as Referring Physician (Gastroenterology)  Tamera Punt, MD as Consulting Physician (Rheumatology)  Pennelope Bracken, MD as Consulting Physician (Otolaryngology)  Dalbert Batman, FNP as Consulting Physician (Obstetrics and Gynecology)  Coralee Rud, MSW as Social Worker (Transplant)  Delene Loll, PhD as Transplant Psychologist (Transplant)  Pia Mau, MD as Transplant Hepatologist (Gastroenterology)  Elease Etienne as Transplant Hepatologist (Transplant)  Genia Harold, RN as Transplant Coordinator    PRIMARY CARE PROVIDER:  Leotis Shames, MD      REASON FOR VISIT: Early postoperative follow up due to concern for bleeding from incision    HPI:  Sara Sanders is a 62 y.o. female who is seen for follow-up after recent hospital stay after she underwent OLT on 7/17 with uncomplicated hospital stay. She was discharged on 7/24 in good condition. Patient states over the past 24 hours she has had increased pain related to her rheumatoid arthritis along her back and extremities. She states that she also noted sanguineous oozing from the midline of her incision. She otherwise denies any new fevers, chest pain, cough, or shortness of breath.      REVIEW OF SYSTEMS:   Remainder of a 10 system review of systems is negative.     Objective     BP 116/59  - Pulse 80  - Temp 37.4 ??C (Tympanic)  - Resp 16  - Ht 152 cm (4' 11.84)  - Wt 58.1 kg (128 lb) Comment: per pt report, pt refused to step on scale - SpO2 99%  - BMI 25.13 kg/m??     PHYSICAL EXAMINATION:   General Appearance: middle aged caucasian female in no acute distress. Alert and oriented x 3.   Pulmonary: Normal respiratory effort.   Cardiovascular: Regular rate and rhythm.   Abdomen: Soft, non-distended, tenderness along LUQ incision. Incision with spontaneous dark sanguineous drainage from midline of incision. Staples overlying mercedes incision. Erythema along LUQ incision  Extremities: Normal gait. Mild edema in lower extremities  Neurologic:  No motor abnormalities noted. Sensation grossly intact.  Skin:  Skin color normal. No rashes or lesions.     DIAGNOSTIC STUDIES:   Imaging: None  Labs: Reviewed    ASSESSMENT: Sara Sanders is a 62 y.o. female who is seen for follow-up after recent hospital stay after she underwent OLT on 7/17 with uncomplicated hospital stay who presents with surgical wound hematoma.    PLAN:   Small surgical wound hematoma evacuated bedside today in clinic  Ciprofloxacin 5 day course for developing cellulitis along incision  Return to clinic next week for re-check

## 2018-05-07 NOTE — Unmapped (Signed)
Patient seen in clinic today as directed by Dr.Gerber, after patient called on-call last night and primary coordinator this morning w.c/o bleeding from incision repeated times. Patient in wc accompanied by her husband. She states she feels awful, citing mainly arthritic pain to her left wrist and back. She says the flare started yesterday. Patient admits she has some incisional pain, but the arthritis pain is worse, stating that she takes tramadol and tylenol for relief.( she used to take mercaptopurine and simponi for tx, but has not been able to do so the last few months d/t illness before txp.) She has a local rheumatologist, Dr.Kemodle. Her last oxycodone was last night, since she says it does not help with the arthritis pain. She reports she is eating a little,drinking ensure daily, as well as about 3 bottles of water daily. She denies n/v/d or constipation. She reports she is voiding well. Encouraged her to eat more frequent small meals as tolerated and to incorporate two nutritional supplements daily, and increase water intake to 2 liters. She was seen by Dr.Serrano and surgery fellow, who removed a couple staples from incision and expressed clotted blood from incision. Distal left wing (~12cm) of incision with ~1.5 cm of erythema around it. Dr.Serrano recommended patient take 500mg  cipro bid for the next 5 days. Educated patient for 10 min.re.diet/fluid and importance of contacting txp if she should develop fever, unrelieved pain, increased redness, increased bleeding or purulent drainage at incision site. Husband/pt verbalized understanding. Patient to return to clinic in 4 days.

## 2018-05-09 NOTE — Unmapped (Signed)
Received on call page from pt. Called back and she was c/o arthritis pain. She stated she was using lidocaine patches in patient but did not think she could get the outpatient. Informed her she could buy lidocaine patches OTC from the drug store. Pt verbalized understanding.

## 2018-05-10 ENCOUNTER — Ambulatory Visit: Admit: 2018-05-10 | Discharge: 2018-05-10 | Payer: PRIVATE HEALTH INSURANCE

## 2018-05-10 ENCOUNTER — Institutional Professional Consult (permissible substitution)
Admit: 2018-05-10 | Discharge: 2018-05-10 | Payer: PRIVATE HEALTH INSURANCE | Attending: Pharmacist Clinician (PhC)/ Clinical Pharmacy Specialist | Primary: Pharmacist Clinician (PhC)/ Clinical Pharmacy Specialist

## 2018-05-10 DIAGNOSIS — Z944 Liver transplant status: Principal | ICD-10-CM

## 2018-05-10 DIAGNOSIS — E871 Hypo-osmolality and hyponatremia: Principal | ICD-10-CM

## 2018-05-10 DIAGNOSIS — K754 Autoimmune hepatitis: Principal | ICD-10-CM

## 2018-05-10 DIAGNOSIS — K7460 Unspecified cirrhosis of liver: Secondary | ICD-10-CM

## 2018-05-10 DIAGNOSIS — Z79899 Other long term (current) drug therapy: Secondary | ICD-10-CM

## 2018-05-10 LAB — TACROLIMUS LEVEL: TACROLIMUS BLOOD: 9.3 ng/mL

## 2018-05-10 LAB — CBC W/ AUTO DIFF
BASOPHILS ABSOLUTE COUNT: 0 10*9/L (ref 0.0–0.1)
BASOPHILS RELATIVE PERCENT: 0.5 %
EOSINOPHILS ABSOLUTE COUNT: 0.1 10*9/L (ref 0.0–0.4)
EOSINOPHILS RELATIVE PERCENT: 1.3 %
HEMATOCRIT: 31 % — ABNORMAL LOW (ref 36.0–46.0)
HEMOGLOBIN: 10.5 g/dL — ABNORMAL LOW (ref 12.0–16.0)
LYMPHOCYTES ABSOLUTE COUNT: 0.6 10*9/L — ABNORMAL LOW (ref 1.5–5.0)
LYMPHOCYTES RELATIVE PERCENT: 7.2 %
MEAN CORPUSCULAR HEMOGLOBIN CONC: 33.8 g/dL (ref 31.0–37.0)
MEAN CORPUSCULAR HEMOGLOBIN: 34.9 pg — ABNORMAL HIGH (ref 26.0–34.0)
MEAN CORPUSCULAR VOLUME: 103.1 fL — ABNORMAL HIGH (ref 80.0–100.0)
MEAN PLATELET VOLUME: 7.8 fL (ref 7.0–10.0)
MONOCYTES ABSOLUTE COUNT: 0.4 10*9/L (ref 0.2–0.8)
MONOCYTES RELATIVE PERCENT: 4.4 %
NEUTROPHILS ABSOLUTE COUNT: 6.9 10*9/L (ref 2.0–7.5)
NEUTROPHILS RELATIVE PERCENT: 85.2 %
PLATELET COUNT: 334 10*9/L (ref 150–440)
RED BLOOD CELL COUNT: 3 10*12/L — ABNORMAL LOW (ref 4.00–5.20)
RED CELL DISTRIBUTION WIDTH: 19.1 % — ABNORMAL HIGH (ref 12.0–15.0)

## 2018-05-10 LAB — COMPREHENSIVE METABOLIC PANEL
ALBUMIN: 3.2 g/dL — ABNORMAL LOW (ref 3.5–5.0)
ALKALINE PHOSPHATASE: 132 U/L — ABNORMAL HIGH (ref 38–126)
ALT (SGPT): 44 U/L (ref 15–48)
ANION GAP: 8 mmol/L — ABNORMAL LOW (ref 9–15)
AST (SGOT): 13 U/L — ABNORMAL LOW (ref 14–38)
BILIRUBIN TOTAL: 1.1 mg/dL (ref 0.0–1.2)
BLOOD UREA NITROGEN: 13 mg/dL (ref 7–21)
BUN / CREAT RATIO: 19
CALCIUM: 8.4 mg/dL — ABNORMAL LOW (ref 8.5–10.2)
CHLORIDE: 94 mmol/L — ABNORMAL LOW (ref 98–107)
CO2: 25 mmol/L (ref 22.0–30.0)
CREATININE: 0.68 mg/dL (ref 0.60–1.00)
EGFR CKD-EPI AA FEMALE: >90 mL/min/{1.73_m2} (ref >=60)
GLUCOSE RANDOM: 93 mg/dL (ref 65–99)
POTASSIUM: 4.2 mmol/L (ref 3.5–5.0)
PROTEIN TOTAL: 6 g/dL — ABNORMAL LOW (ref 6.5–8.3)
SODIUM: 127 mmol/L — ABNORMAL LOW (ref 135–145)

## 2018-05-10 LAB — PHOSPHORUS: Phosphate:MCnc:Pt:Ser/Plas:Qn:: 4.1

## 2018-05-10 LAB — MONOCYTES RELATIVE PERCENT: Lab: 4.4

## 2018-05-10 LAB — GAMMA GLUTAMYL TRANSFERASE: Gamma glutamyl transferase:CCnc:Pt:Ser/Plas:Qn:: 132 — ABNORMAL HIGH

## 2018-05-10 LAB — OSMOLALITY MEASURED: Osmolality:Osmol:Pt:Ser/Plas:Qn:: 264 — ABNORMAL LOW

## 2018-05-10 LAB — TACROLIMUS BLOOD: Lab: 9.3

## 2018-05-10 LAB — MAGNESIUM: Magnesium:MCnc:Pt:Ser/Plas:Qn:: 1.6

## 2018-05-10 LAB — BILIRUBIN DIRECT: Bilirubin.glucuronidated:MCnc:Pt:Ser/Plas:Qn:: 0.6 — ABNORMAL HIGH

## 2018-05-10 LAB — CHLORIDE: Chloride:SCnc:Pt:Ser/Plas:Qn:: 94 — ABNORMAL LOW

## 2018-05-10 MED ORDER — FUROSEMIDE 40 MG TABLET
ORAL_TABLET | Freq: Every day | ORAL | 1 refills | 0 days | Status: CP
Start: 2018-05-10 — End: 2018-05-19

## 2018-05-10 MED ORDER — PREDNISONE 10 MG TABLET
ORAL_TABLET | Freq: Every day | ORAL | 1 refills | 0 days | Status: CP
Start: 2018-05-10 — End: 2018-05-19

## 2018-05-10 MED ORDER — CEPHALEXIN 500 MG TABLET
ORAL_TABLET | Freq: Four times a day (QID) | ORAL | 0 refills | 0 days | Status: CP
Start: 2018-05-10 — End: 2018-05-13

## 2018-05-10 MED ORDER — MYCOPHENOLATE SODIUM 180 MG TABLET,DELAYED RELEASE
ORAL_TABLET | Freq: Two times a day (BID) | ORAL | 11 refills | 0.00000 days | Status: CP
Start: 2018-05-10 — End: 2018-05-13

## 2018-05-10 NOTE — Unmapped (Signed)
[facility] CLINIC PHARMACY NOTE  05/10/2018   Sara Sanders  [id number]    Medication changes today:   1. Start lasix 40 mg daily  2. Increase prednisone to 20 mg daily to help with RA flares  3. Start keflex 500 mg QID for incisional infection   4. Increase myfortic to 540 mg bid    Education/Adherence tools provided today:  1.provided updated medication list  2. provided additional pill box education  3.  provided assistance with pill box fill  4.  provided additional education on immunosuppression and transplant related medications including reviewing indications of medications, dosing and side effects  5. provided additional education on side effects of RA medications  6.     Follow up items:  1. goal of understanding indications and dosing of immunosuppression medications  2. RA flare  3. Immunosuppression regimen    Next visit with pharmacy in 1-2 weeks     CC: Severe joint pain from RA- difficult to move from wheel chair to clinic exam bed  ____________________________________________________________________    Sara Sanders is a 62 y.o. 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     Other PMH significant for ascites, esophageal varices, hypothyroidism, RA, HTN    Seen by pharmacy today for: medication management and pill box fill and adherence education; last seen by pharmacy first visit     There were no vitals filed for this visit.    No Known Allergies    All medications reviewed and updated.     Medication list includes revisions made during today???s encounter    Outpatient Encounter Medications as of 05/10/2018   Medication Sig Dispense Refill   ??? acetaminophen (TYLENOL) 325 MG tablet Take 2 tablets (650 mg total) by mouth every four (4) hours as needed for pain. 100 tablet 2   ??? aspirin (ECOTRIN) 81 MG tablet Take 1 tablet (81 mg total) by mouth daily. 30 tablet 11   ??? calcium carbonate-vitamin D3 600 mg(1,500mg ) -200 unit per tablet Take 1 tablet by mouth daily.      ??? ciprofloxacin HCl (CIPRO) 500 MG tablet Take 1 tablet (500 mg total) by mouth Two (2) times a day. for 5 days 10 tablet 0   ??? docusate sodium (COLACE) 100 MG capsule Take 1 capsule (100 mg total) by mouth two (2) times a day as needed for constipation. 60 capsule 0   ??? golimumab (SIMPONI) 50 mg/0.5 mL PnIj pen injector HOLD  0   ??? levothyroxine (SYNTHROID, LEVOTHROID) 75 MCG tablet Take 1/2 tablet (37.5 mcg) by mouth once daily on Wednesdays and Sundays and take 1 tablet (75 mcg) by mouth once daily on all other days.     ??? magnesium oxide (MAG-OX) 400 mg (241.3 mg magnesium) tablet Take 1 tablet (400 mg total) by mouth Two (2) times a day.  0   ??? mycophenolate (MYFORTIC) 180 MG EC tablet Take 2 tablets (360 mg total) by mouth Two (2) times a day. 120 tablet 11   ??? omeprazole (PRILOSEC) 40 MG capsule Take 1 capsule (40 mg total) by mouth Two (2) times a day. 60 capsule 0   ??? oxyCODONE (ROXICODONE) 5 MG immediate release tablet Take 1-2 tablets (5-10 mg total) by mouth every six (6) hours as needed. for up to 7 days 50 tablet 0   ??? polyethylene glycol (MIRALAX) 17 gram packet Take 17 g by mouth daily as needed. 30 each 0   ??? predniSONE (  DELTASONE) 5 MG tablet 7/24: 15mg  (3 tabs) at 9AM. 7/25: 10mg  (2 tabs) at 9AM. 7/26 onward: 5mg  (1 tab) daily. 35 tablet 11   ??? PROGRAF 1 mg capsule Take 9 capsules (9 mg total) by mouth two (2) times a day. 540 capsule 11   ??? sulfamethoxazole-trimethoprim (BACTRIM,SEPTRA) 400-80 mg per tablet Take 1 tablet (80 mg of trimethoprim total) by mouth 3 (three) times a week. 12 tablet 5   ??? ursodiol (ACTIGALL) 300 mg capsule Take 1 capsule (300 mg total) by mouth Two (2) times a day. 60 capsule 0   ??? valGANciclovir (VALCYTE) 450 mg tablet Take 1 tablet (450 mg total) by mouth daily. 30 tablet 2     No facility-administered encounter medications on file as of 05/10/2018.        Induction agent : solumedrol    CURRENT IMMUNOSUPPRESSION: prograf 9 mg PO BID  prograf/cyclosporine goal: 8-10   myfortic360  mg PO bid    prednisone 5 mg daily    Patient is tolerating immunosuppression well    IMMUNOSUPPRESSION DRUG LEVELS:  Lab Results   Component Value Date    TACROLIMUS 8.9 05/07/2018    TACROLIMUS 6.7 05/05/2018    TACROLIMUS 5.7 05/04/2018     No results found for: CYCLO  No results found for: EVEROLIMUS  No results found for: SIROLIMUS    Prograf level is accurate 12 hour trough    Graft function: stable  DSA: ntd  Biopsies to date:   WBC/ANC:  wnl    Plan: From IS for liver txp standpoint, pt is doing well, however d/t RA flares, we will be increasing prednisone to 20 mg daily and myfortic to 540 mg bid. Continue to monitor  Pt to f/u with RA physician to discuss alternative oral therapies for RA flares as we still need to hold golimumab d/t risk of infection at this time.     ID prophylaxis:   CMV Status: D-/ R+, moderate risk. CMV prophylaxis: valganciclovir 450 mg daily x 3 months per protocol.  No results found for: CMVCP  PCP Prophylaxis: bactrim SS 1 tab MWF x 6 months.  Thrush:  completed in hospital  Patient is  tolerating infectious prophylaxis well    Plan: Continue per protocol. Continue to monitor.    CV Prophylaxis: asa 81 mg   The 10-year ASCVD risk score Denman George DC Jr., et al., 2013) is: 3%  Statin therapy: Not indicated; currently on    Plan: not indicated. Continue to monitor     BP: Goal < 140/90. Clinic vitals reported above  Home BP ranges: BP - pt not logging yet  Current meds include: none  Plan: Educated pt to start logging BP. Continue to monitor    Anemia:  H/H:   Lab Results   Component Value Date    HGB 11.1 (L) 05/07/2018     Lab Results   Component Value Date    HCT 33.3 (L) 05/07/2018     Iron panel:  Lab Results   Component Value Date    IRON 82 12/08/2017    TIBC  12/08/2017      Comment:      Unable to calculate.      FERRITIN 539.0 (H) 12/08/2017     Lab Results   Component Value Date    LABIRON  12/08/2017      Comment:      Unable to calculate. Prior ESA use: none    Plan:  Continue to monitor.  DM:   Lab Results   Component Value Date    A1C 4.2 (L) 04/27/2018   . Goal A1c < 7  History of Dm? No  Established with endocrinologist/PCP for BG managment? No  Plan: continue to monitor    Electrolytes: wnl  Meds currently on: mag 400 mg bid  Plan:  Continue to monitor     GI/BM: Pt reports 1 BM  Meds currently on: none  Plan:  Continue to monitor    Pain: pt reports significant pain  Meds currently on: golimumab for RA flares on hold, on tramadol and tylenol BID for RA flares as well. No complaints of incision pain at this time.  Plan: Continue to monitor    Bone health:   Vitamin D Level: none available. Goal > 30.   Last DEXA results:    Current meds include:   Plan: Vitamin D level  needs to be drawn with next lab schedule, Continue to monitor.     Women's/Men's Health:  Sara Sanders is a 62 y.o. Female perimenopausal. Patient reports no men's/women's health issues  Plan: Continue to monitor    Adherence: Patient has average understanding of medications; was not able to independently identify names/doses of immunosuppressants and OI meds.  Patient  does not fill their own pill box on a regular basis at home. - husband has been primary to manage medications and arrange pill box  Patient brought medication card:yes  Pill box:was correct  Plan:  provided moderate adherence counseling/intervention    Pt was given cipro x 5 days for possible cellulitis- last dose today- advised pt to finish this but will change antibiotic to have more gram positive covg-   Keflex 500 mg QID x 10 days prescribed today    Spent approximately 40 minutes on educating this patient and greater than 50% was spent in direct face to face counseling regarding post transplant medication education. Questions and concerns were address to patient's satisfaction.    Patient was reviewed with Dr.Desai who was agreement with the stated plan:     During this visit, the following was completed:   BG log data assessment  BP log data assessment  Labs ordered and evaluated  complex treatment plan >1 DS   Patient education was completed for 11-24 minutes     All questions/concerns were addressed to the patient's satisfaction.  __________________________________________  Noah Charon, PHARMD, CPP  [employer]  PAGER [phone]

## 2018-05-10 NOTE — Unmapped (Signed)
TRANSPLANT SURGERY PROGRESS NOTE    Assessment and Plan  Sara Sanders is a 62 y.o. 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 She had no surgical complications immediately post-operatively but is currently having significant pain consistent with RA flare as well as LE edema. Her wound appears to be healing well with likely lipolysis and fatty serous fluid that was able to be expressed. This is at risk for infection although she has no overt signs of cellulitis or deeper fluid collection at this time. For her RA, will see if she is able to be evaluated by her local rheumatologist to discuss treatment options. We would recommend against an anti-TNF at this time given the risk of infection. In the interim we can increase immunosuppression to try to manage symptoms.     RECOMMENDATIONS:  1. Post-OLT Care:  -CBC, BMP and LFTs today reviewed and improving. She does have slightly lower Na w/ fluid overload likely hypervolemia hyponatremia related to recent surgery.   -Continue tacrolimus as is. Goal trough 8-10.  -Changes to cellcept and prednisone below   -Continue Bactrim for PCP prophylaxis  -Continue Ursodiol in setting of intraoperative stent  -Start cephalexin 500 mg QID to prevent incisional cellulitis    2. RA Flare  -Follow-up with local rheumatologist. If unavailable this week, then admit to obs unit for further management of pain and consideration of inpatient rheum consult  -Increase cellcept to 540 mg BID   -Increase Prednisone to 20 mg daily  -If responds to changes as above, then continue to increase cellcept and initiate slow taper of prednisone.   -Monitor for infectious symptoms on increased immunosuppression    3. LE Edema  -Start Lasix 40 mg PO daily. Need to watch sodium carefully on diuretics. If continues to drop, then will need to hold diuretics.    Patient seen and staffed with Dr. Celine Mans. Return to transplant clinic in       Anabel Halon, MD [facility]  Transplant Hepatology Fellow [id number]  Pager [phone]      Subjective  Sara Sanders is a 62 y.o. 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 She had no surgical complications immediately post-operatively.     She has been having significant arthritis symptoms including wrist, shoulders, neck and back pain with warm joints. Also had bloody drainage from her incision that was expressed in transplant clinic when we saw her recently on 05/06/18. She was prescribed 5 days of ciprofloxacin 500 mg BID. Low grade fever at home 99 F. No overt fevers, chills, purulent discharge, or foul smelling discharge. Apart from her pain, she is also having increased LE edema with 2 lb weight gain at home. She used to take mercaptopurine and simponi prior to transplant for her RA. Both were held for 3 months prior to transplant.     She is currently taking medications as prescribed but has felt overwhelmed by the number of pills she has had to take. She also had a recent death of her brother in law 2 days prior and is overwhelmed by that. On ROS, denies diarrhea, headaches, confusion.     Objective    Vitals:    05/10/18 1403   BP: 108/49   Pulse: 84  Temp: 36.8 ??C   TempSrc: Tympanic   Weight: 58.1 kg (128 lb)   Height: 152 cm (4' 11.84)      Body mass index is 25.13 kg/m??.     Physical Exam:  General Appearance:   Thin female, mild distress from pain, sitting in a wheelchair.   Lungs:                Clear to auscultation bilaterally. Some bibasilar crackles at left base.   Heart:                           Regular rate and rhythm  Abdomen:                Staples in place. No significant erythema, fluctuance. At left most site of staple line, she has lipomatous material mixed with old blood that is able to be expressed. Minimal warmth. No significant erythema or fluctuance. Otherwise soft, minimally tender, non-distended  Extremities:              2+ pitting edema, warm and well perfused. Scattered ecchymoses throughout body with increased skin fragility.       Data Review:  All lab results last 24 hours:    Recent Results (from the past 24 hour(s))   Tacrolimus level    Collection Time: 05/10/18  8:48 AM   Result Value Ref Range    Tacrolimus, Timed 9.3 ng/mL   Comprehensive Metabolic Panel    Collection Time: 05/10/18  8:48 AM   Result Value Ref Range    Sodium 127 (L) 135 - 145 mmol/L    Potassium 4.2 3.5 - 5.0 mmol/L    Chloride 94 (L) 98 - 107 mmol/L    CO2 25.0 22.0 - 30.0 mmol/L    Anion Gap 8 (L) 9 - 15 mmol/L    BUN 13 7 - 21 mg/dL    Creatinine 4.54 0.98 - 1.00 mg/dL    BUN/Creatinine Ratio 19     EGFR CKD-EPI Non-African American, Female >90 >=60 mL/min/1.25m2    EGFR CKD-EPI African American, Female >90 >=60 mL/min/1.67m2    Glucose 93 65 - 99 mg/dL    Calcium 8.4 (L) 8.5 - 10.2 mg/dL    Albumin 3.2 (L) 3.5 - 5.0 g/dL    Total Protein 6.0 (L) 6.5 - 8.3 g/dL    Total Bilirubin 1.1 0.0 - 1.2 mg/dL    AST 13 (L) 14 - 38 U/L    ALT 44 15 - 48 U/L    Alkaline Phosphatase 132 (H) 38 - 126 U/L   Magnesium Level    Collection Time: 05/10/18  8:48 AM   Result Value Ref Range    Magnesium 1.6 1.6 - 2.2 mg/dL   Gamma GT (GGT)    Collection Time: 05/10/18  8:48 AM   Result Value Ref Range    GGT 132 (H) 11 - 48 U/L   Bilirubin, Direct    Collection Time: 05/10/18  8:48 AM   Result Value Ref Range    Bilirubin, Direct 0.60 (H) 0.00 - 0.40 mg/dL   Phosphorus Level    Collection Time: 05/10/18  8:48 AM   Result Value Ref Range    Phosphorus 4.1 2.9 - 4.7 mg/dL   CBC w/ Differential    Collection Time: 05/10/18  8:48 AM   Result Value Ref Range    WBC 8.1 4.5 - 11.0 10*9/L    RBC 3.00 (L) 4.00 -  5.20 10*12/L    HGB 10.5 (L) 12.0 - 16.0 g/dL    HCT 13.0 (L) 86.5 - 46.0 %    MCV 103.1 (H) 80.0 - 100.0 fL    MCH 34.9 (H) 26.0 - 34.0 pg    MCHC 33.8 31.0 - 37.0 g/dL    RDW 78.4 (H) 69.6 - 15.0 %    MPV 7.8 7.0 - 10.0 fL    Platelet 334 150 - 440 10*9/L    Neutrophils % 85.2 %    Lymphocytes % 7.2 %    Monocytes % 4.4 %    Eosinophils % 1.3 %    Basophils % 0.5 %    Absolute Neutrophils 6.9 2.0 - 7.5 10*9/L    Absolute Lymphocytes 0.6 (L) 1.5 - 5.0 10*9/L    Absolute Monocytes 0.4 0.2 - 0.8 10*9/L    Absolute Eosinophils 0.1 0.0 - 0.4 10*9/L    Absolute Basophils 0.0 0.0 - 0.1 10*9/L    Large Unstained Cells 1 0 - 4 %    Macrocytosis Marked (A) Not Present    Anisocytosis Moderate (A) Not Present         Imaging:  CXR (05/02/18):  CONCLUSIONS:  Interval removal of right IJ sheath.  Low lung volumes. Slight improved aeration of the right lung. Persistent small pleural fluid.  The left lung is clear.  Stable heart and mediastinum.    Liver US Transplant (05/02/18):  HEPATOBILIARY: The liver is normal in echogenicity. No focal hepatic lesions. No intrahepatic biliary ductal dilatation. The common bile duct is normal in caliber.   The gallbladder is surgically absent.    There are multiple small fluid collections adjacent to the liver. The collection adjacent to the left hepatic lobe measures 5.5 x 5.5 x 2.3 cm. Second collection is noted more laterally measuring 4.8 x 7.2 x 2.2 cm. A third collection measures 5.4 x 4.4 x 2.4 cm adjacent to the right posterior liver.    PANCREAS: Visualized portion is unremarkable.    SPLEEN: Mild splenomegaly. Small adjacent splenule measuring 1.1 x 1.3 cm.    KIDNEYS: There is a small right perinephric collection which measures up to 0.8 cm in thickness. Normal in size and echotexture. No solid masses or calculi. No hydronephrosis.    VESSELS:  - Portal vein: The main, left and right portal veins are patent with hepatopetal flow. Normal portal vein velocity ( 0.20 m/s or greater)  - Splenic vein: Patent, with hepatopetal flow.  - Hepatic veins/IVC: The IVC, left, middle and right hepatic veins are patent with bi/triphasic waveforms.  - Hepatic artery: Patent with resistive indices within normal limits.  - Visualized proximal aorta is patent with atheromatous disease.    OTHER: No ascites. There are trace bilateral pleural effusions.      Impression     Patent hepatic transplant vasculature.  Stable resistive indices in the hepatic transplant arteries, within normal limits.  Multiple perihepatic fluid collections, some of which are new from prior.  Trace bilateral pleural effusions.  Small right perinephric collection.

## 2018-05-11 NOTE — Unmapped (Signed)
Patient's husband contacted coordiator after she saw her rheumatologist today, saying that she had an xray and 2 lumbar vertebrae were found fractured and she had a significant amount of fluid in her right lung. Husband said the rheumatologist plan of care for the patient's current flare, was to see if the increased myfortic helped sx subside, saying that the Dr.thought that medicine was the best course of action now. Paged PA Vonderau who recommended that the patient come to clinic on Thursday to see a surgeon, complete another xray and that the patient's rheumatology note be obtained. Contacted by Dr.Gerber, who agreed with plan, unless patient's pain is unrelenting or she becomes SOB. Relayed to patient's husband and gave him appt times for Thursday. He verbalized understanding.

## 2018-05-12 ENCOUNTER — Ambulatory Visit: Admit: 2018-05-12 | Discharge: 2018-05-13 | Payer: PRIVATE HEALTH INSURANCE

## 2018-05-12 DIAGNOSIS — Z79899 Other long term (current) drug therapy: Secondary | ICD-10-CM

## 2018-05-12 DIAGNOSIS — Z944 Liver transplant status: Principal | ICD-10-CM

## 2018-05-12 LAB — COMPREHENSIVE METABOLIC PANEL
ALBUMIN: 3.3 g/dL — ABNORMAL LOW (ref 3.5–5.0)
ALKALINE PHOSPHATASE: 130 U/L — ABNORMAL HIGH (ref 38–126)
ALT (SGPT): 40 U/L (ref 15–48)
ANION GAP: 9 mmol/L (ref 9–15)
AST (SGOT): 13 U/L — ABNORMAL LOW (ref 14–38)
BILIRUBIN TOTAL: 0.9 mg/dL (ref 0.0–1.2)
BLOOD UREA NITROGEN: 16 mg/dL (ref 7–21)
BUN / CREAT RATIO: 16
CALCIUM: 8.7 mg/dL (ref 8.5–10.2)
CHLORIDE: 96 mmol/L — ABNORMAL LOW (ref 98–107)
CREATININE: 1.01 mg/dL — ABNORMAL HIGH (ref 0.60–1.00)
EGFR CKD-EPI AA FEMALE: 70 mL/min/{1.73_m2} (ref >=60)
EGFR CKD-EPI NON-AA FEMALE: 61 mL/min/{1.73_m2} (ref >=60)
GLUCOSE RANDOM: 76 mg/dL (ref 65–99)
POTASSIUM: 4.2 mmol/L (ref 3.5–5.0)
PROTEIN TOTAL: 6.1 g/dL — ABNORMAL LOW (ref 6.5–8.3)
SODIUM: 131 mmol/L — ABNORMAL LOW (ref 135–145)

## 2018-05-12 LAB — CBC W/ AUTO DIFF
BASOPHILS ABSOLUTE COUNT: 0.1 10*9/L (ref 0.0–0.1)
BASOPHILS RELATIVE PERCENT: 0.8 %
EOSINOPHILS ABSOLUTE COUNT: 0.1 10*9/L (ref 0.0–0.4)
EOSINOPHILS RELATIVE PERCENT: 1.5 %
HEMATOCRIT: 30 % — ABNORMAL LOW (ref 36.0–46.0)
HEMOGLOBIN: 10.5 g/dL — ABNORMAL LOW (ref 13.5–16.0)
LARGE UNSTAINED CELLS: 2 % (ref 0–4)
LYMPHOCYTES ABSOLUTE COUNT: 0.6 10*9/L — ABNORMAL LOW (ref 1.5–5.0)
LYMPHOCYTES RELATIVE PERCENT: 11 %
MEAN CORPUSCULAR HEMOGLOBIN CONC: 35.1 g/dL (ref 31.0–37.0)
MEAN CORPUSCULAR HEMOGLOBIN: 35.3 pg — ABNORMAL HIGH (ref 26.0–34.0)
MEAN CORPUSCULAR VOLUME: 100.8 fL — ABNORMAL HIGH (ref 80.0–100.0)
MEAN PLATELET VOLUME: 7.2 fL (ref 7.0–10.0)
MONOCYTES ABSOLUTE COUNT: 0.2 10*9/L (ref 0.2–0.8)
NEUTROPHILS ABSOLUTE COUNT: 4.5 10*9/L (ref 2.0–7.5)
NEUTROPHILS RELATIVE PERCENT: 81.3 %
PLATELET COUNT: 401 10*9/L (ref 150–440)
RED BLOOD CELL COUNT: 2.97 10*12/L — ABNORMAL LOW (ref 4.00–5.20)
RED CELL DISTRIBUTION WIDTH: 19 % — ABNORMAL HIGH (ref 12.0–15.0)
WBC ADJUSTED: 5.5 10*9/L (ref 4.5–11.0)

## 2018-05-12 LAB — PHOSPHORUS
PHOSPHORUS: 4.9 mg/dL — ABNORMAL HIGH (ref 2.9–4.7)
Phosphate:MCnc:Pt:Ser/Plas:Qn:: 4.9 — ABNORMAL HIGH

## 2018-05-12 LAB — ANION GAP: Anion gap 3:SCnc:Pt:Ser/Plas:Qn:: 9

## 2018-05-12 LAB — GAMMA GLUTAMYL TRANSFERASE: Gamma glutamyl transferase:CCnc:Pt:Ser/Plas:Qn:: 108 — ABNORMAL HIGH

## 2018-05-12 LAB — TACROLIMUS BLOOD: Lab: 14

## 2018-05-12 LAB — PLATELET COUNT: Lab: 401

## 2018-05-12 LAB — BILIRUBIN DIRECT: Bilirubin.glucuronidated:MCnc:Pt:Ser/Plas:Qn:: 0.6 — ABNORMAL HIGH

## 2018-05-12 LAB — MAGNESIUM: Magnesium:MCnc:Pt:Ser/Plas:Qn:: 1.6

## 2018-05-12 MED ORDER — PROGRAF 1 MG CAPSULE: capsule | 11 refills | 0 days

## 2018-05-12 MED ORDER — PROGRAF 1 MG CAPSULE
ORAL_CAPSULE | Freq: Two times a day (BID) | ORAL | 11 refills | 0.00000 days | Status: CP
Start: 2018-05-12 — End: 2018-05-12

## 2018-05-12 NOTE — Unmapped (Signed)
Patient's 7/30 tac resulted supratherapeutic. Discussed with txp pharmacist, who recommended patient skip her prograf tonight, have labs drawn in the morning and start taking 8mg  bid tomorrow. Spoke with patient, who confirmed the trough was valid, and relayed all changes in prograf dosing. She verbalized understanding. She said her pain was manageable, since she is taking pain meds atc. She said her swelling has not improved, though.

## 2018-05-12 NOTE — Unmapped (Signed)
Patient seen in clinic today as scheduled from discharge, accompanied by her husband. She is in some distress from pain r/t her RA flare, mainly left wrist and upper back.. She denies n/v/d or constipation, she reports she is drinking 2 ensure supplements daily, but eating very little. Again encouraged her with small frequent high protein snacks/meals and Dr.Leiber okayed her drinking a 3rd supplement daily. Patient reports some short-lived stomach pain/cramping with eating. Increased B LE edema today, particularly feet at 3+, no weeping noted. She reports she gets some relief from tylenol and tramadol. Incision reportedly draining less, but has some purulent sanguinous looking drainage today. Incision with much less erythema. Dr.Leiber and Dr.Desai examined and recommended patient continue to change dressing as needed and start keflex for 10 days. Patient's myfortic also increased with hope of quieting RA flare, and lasix started daily. Per Dr.Desai, plan for patient to see local Rheumatologist this week, but if unable, will admit for in house Rheum.consult by Thursday. Patient with two bloody skin tears to left FA and one to rt, to which husband applied tegaderm drsg directly. Removed dressings,cleansed with saline, and applied non-adherent gauze and coban. Educated patient/husband for 10 min. re.wound care, s/sx of infection and nutrition.

## 2018-05-13 ENCOUNTER — Ambulatory Visit: Admit: 2018-05-13 | Discharge: 2018-05-14 | Payer: PRIVATE HEALTH INSURANCE

## 2018-05-13 ENCOUNTER — Ambulatory Visit
Admit: 2018-05-13 | Discharge: 2018-05-14 | Payer: PRIVATE HEALTH INSURANCE | Attending: Student in an Organized Health Care Education/Training Program | Primary: Student in an Organized Health Care Education/Training Program

## 2018-05-13 ENCOUNTER — Institutional Professional Consult (permissible substitution)
Admit: 2018-05-13 | Discharge: 2018-05-14 | Payer: PRIVATE HEALTH INSURANCE | Attending: Pharmacist Clinician (PhC)/ Clinical Pharmacy Specialist | Primary: Pharmacist Clinician (PhC)/ Clinical Pharmacy Specialist

## 2018-05-13 DIAGNOSIS — M069 Rheumatoid arthritis, unspecified: Secondary | ICD-10-CM

## 2018-05-13 DIAGNOSIS — Z944 Liver transplant status: Principal | ICD-10-CM

## 2018-05-13 DIAGNOSIS — J90 Pleural effusion, not elsewhere classified: Secondary | ICD-10-CM

## 2018-05-13 DIAGNOSIS — E612 Magnesium deficiency: Principal | ICD-10-CM

## 2018-05-13 DIAGNOSIS — Z79899 Other long term (current) drug therapy: Secondary | ICD-10-CM

## 2018-05-13 DIAGNOSIS — M545 Low back pain: Principal | ICD-10-CM

## 2018-05-13 DIAGNOSIS — M542 Cervicalgia: Secondary | ICD-10-CM

## 2018-05-13 LAB — COMPREHENSIVE METABOLIC PANEL
ALBUMIN: 3.4 g/dL — ABNORMAL LOW (ref 3.5–5.0)
ALKALINE PHOSPHATASE: 128 U/L — ABNORMAL HIGH (ref 38–126)
ALT (SGPT): 38 U/L (ref 15–48)
ANION GAP: 9 mmol/L (ref 9–15)
AST (SGOT): 13 U/L — ABNORMAL LOW (ref 14–38)
BILIRUBIN TOTAL: 0.9 mg/dL (ref 0.0–1.2)
BLOOD UREA NITROGEN: 18 mg/dL (ref 7–21)
BUN / CREAT RATIO: 17
CHLORIDE: 94 mmol/L — ABNORMAL LOW (ref 98–107)
CO2: 28 mmol/L (ref 22.0–30.0)
CREATININE: 1.07 mg/dL — ABNORMAL HIGH (ref 0.60–1.00)
EGFR CKD-EPI AA FEMALE: 65 mL/min/{1.73_m2} (ref >=60)
EGFR CKD-EPI NON-AA FEMALE: 57 mL/min/{1.73_m2} — ABNORMAL LOW (ref >=60)
GLUCOSE RANDOM: 78 mg/dL (ref 65–179)
POTASSIUM: 4.5 mmol/L (ref 3.5–5.0)
PROTEIN TOTAL: 6.3 g/dL — ABNORMAL LOW (ref 6.5–8.3)
SODIUM: 131 mmol/L — ABNORMAL LOW (ref 135–145)

## 2018-05-13 LAB — CBC W/ AUTO DIFF
BASOPHILS RELATIVE PERCENT: 1.3 %
EOSINOPHILS ABSOLUTE COUNT: 0.1 10*9/L (ref 0.0–0.4)
EOSINOPHILS RELATIVE PERCENT: 2.1 %
HEMATOCRIT: 30.3 % — ABNORMAL LOW (ref 36.0–46.0)
HEMOGLOBIN: 10.6 g/dL — ABNORMAL LOW (ref 13.5–16.0)
LARGE UNSTAINED CELLS: 1 % (ref 0–4)
LYMPHOCYTES ABSOLUTE COUNT: 0.6 10*9/L — ABNORMAL LOW (ref 1.5–5.0)
LYMPHOCYTES RELATIVE PERCENT: 10.1 %
MEAN CORPUSCULAR HEMOGLOBIN CONC: 34.9 g/dL (ref 31.0–37.0)
MEAN CORPUSCULAR HEMOGLOBIN: 34.8 pg — ABNORMAL HIGH (ref 26.0–34.0)
MEAN CORPUSCULAR VOLUME: 99.9 fL (ref 80.0–100.0)
MEAN PLATELET VOLUME: 7.4 fL (ref 7.0–10.0)
MONOCYTES ABSOLUTE COUNT: 0.2 10*9/L (ref 0.2–0.8)
MONOCYTES RELATIVE PERCENT: 4 %
NEUTROPHILS ABSOLUTE COUNT: 4.8 10*9/L (ref 2.0–7.5)
NEUTROPHILS RELATIVE PERCENT: 81.3 %
PLATELET COUNT: 469 10*9/L — ABNORMAL HIGH (ref 150–440)
RED CELL DISTRIBUTION WIDTH: 18.7 % — ABNORMAL HIGH (ref 12.0–15.0)
WBC ADJUSTED: 5.9 10*9/L (ref 4.5–11.0)

## 2018-05-13 LAB — GAMMA GLUTAMYL TRANSFERASE: Gamma glutamyl transferase:CCnc:Pt:Ser/Plas:Qn:: 105 — ABNORMAL HIGH

## 2018-05-13 LAB — PHOSPHORUS: Phosphate:MCnc:Pt:Ser/Plas:Qn:: 5 — ABNORMAL HIGH

## 2018-05-13 LAB — BILIRUBIN DIRECT: Bilirubin.glucuronidated:MCnc:Pt:Ser/Plas:Qn:: 0.6 — ABNORMAL HIGH

## 2018-05-13 LAB — TACROLIMUS BLOOD: Lab: 6.2

## 2018-05-13 LAB — MAGNESIUM: Magnesium:MCnc:Pt:Ser/Plas:Qn:: 1.6

## 2018-05-13 LAB — EGFR CKD-EPI AA FEMALE: Lab: 65

## 2018-05-13 LAB — EOSINOPHILS RELATIVE PERCENT: Lab: 2.1

## 2018-05-13 MED ORDER — TRAMADOL 50 MG TABLET
ORAL_TABLET | ORAL | 0 refills | 0.00000 days | PRN
Start: 2018-05-13 — End: 2018-05-27

## 2018-05-13 MED ORDER — MAGNESIUM OXIDE 400 MG (241.3 MG MAGNESIUM) TABLET: 400 mg | tablet | Freq: Two times a day (BID) | 11 refills | 0 days | Status: AC

## 2018-05-13 MED ORDER — MAGNESIUM OXIDE 400 MG (241.3 MG MAGNESIUM) TABLET: 1 | tablet | 11 refills | 0 days

## 2018-05-13 MED ORDER — MYCOPHENOLATE SODIUM 180 MG TABLET,DELAYED RELEASE
ORAL_TABLET | Freq: Two times a day (BID) | ORAL | 11 refills | 0 days | Status: CP
Start: 2018-05-13 — End: 2018-05-27

## 2018-05-13 MED ORDER — LEVOTHYROXINE 75 MCG TABLET
ORAL_TABLET | Freq: Every day | ORAL | 11 refills | 0 days
Start: 2018-05-13 — End: 2019-01-31

## 2018-05-13 MED ORDER — CEPHALEXIN 500 MG TABLET
ORAL_TABLET | Freq: Four times a day (QID) | ORAL | 0 refills | 0.00000 days
Start: 2018-05-13 — End: 2018-05-19

## 2018-05-13 MED ORDER — MAGNESIUM OXIDE 400 MG (241.3 MG MAGNESIUM) TABLET
ORAL_TABLET | Freq: Two times a day (BID) | ORAL | 11 refills | 0.00000 days | Status: CP
Start: 2018-05-13 — End: 2018-05-13

## 2018-05-13 MED ORDER — PROGRAF 1 MG CAPSULE
ORAL_CAPSULE | Freq: Two times a day (BID) | ORAL | 11 refills | 0 days | Status: CP
Start: 2018-05-13 — End: 2018-05-14

## 2018-05-13 MED FILL — MAG OXIDE (PER TABLET)/400MG/TAB: MAG OXIDE (PER TABLET)/400MG/TAB | 30 days supply | Qty: 60 | Fill #0

## 2018-05-13 NOTE — Unmapped (Signed)
TRANSPLANT SURGERY PROGRESS NOTE    Assessment and Plan  Sara Sanders is a 62 y.o. 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 She had no surgical complications immediately post-operatively but is currently having back pain and lower extremity swelling.    RECOMMENDATIONS:  1. Post-OLT Care:  -CBC, BMP and LFTs today reviewed. She does have slightly lower Na w/ fluid overload likely hypervolemia hyponatremia related to recent surgery.   -Follow up tac level. Goal trough 8-10.  -Changes to cellcept and prednisone below   -Continue Bactrim for PCP prophylaxis  -Continue Ursodiol in setting of intraoperative stent  -Continue cephalexin 500 mg QID to prevent incisional cellulitis     2. RA Flare  -Completed visit with Rheumatology who recommended no changes to current regimen    3. LE Edema  -Continue Lasix 40 mg PO daily. Encouraged to increased po intake.    4. Back pain  -Lumbar fracture noted at Rheumatology visit. Repeat spinal XR today for further assessment.      Subjective  Sara Sanders is a 62 y.o. 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 She had no surgical complications immediately post-operatively.     She has been having significant arthritis symptoms including wrist, shoulders, neck and back pain with warm joints. Also had bloody drainage from her incision that was expressed in transplant clinic when we saw her recently on 05/06/18 and 05/10/18. She was prescribed 5 days of ciprofloxacin 500 mg BID and subsequently cephalexin for developing skin cellulitis.     Since her recent visit, she has been to see her Rheumatologist without changes to her medication due to increase in her immunosuppression medications. However, during this visit chest imaging revealed continued R pleural effusion and lumbar fractures in correlation to new onset back pain since Saturday, 05/08/18. Today, she describes lower back pain that is persistent and is aggravated by certain movements and long periods of standing. She denies any past history of associated trauma. She does endorse that her breathing has improved, however. She continues to use the incentive spirometer and denies any shortness of breath or cough. She complains of decreased appetite due to taste.    Objective    Vitals:    05/13/18 1215   BP: 108/48   Pulse: 76   Temp: 36.6 ??C   TempSrc: Tympanic   SpO2: 96%   Weight: 57.2 kg (126 lb)   Height: 149.9 cm (4' 11.02)      Body mass index is 25.43 kg/m??.     Physical Exam:  General Appearance:   Thin female, mild distress from pain, sitting in a wheelchair.   Lungs:                Clear to auscultation bilaterally.  Heart:                           Regular rate and rhythm  Abdomen:                Staples in place. No significant erythema, fluctuance. At left most site of staple line, she has mild ss fluid drainage. No significant erythema or fluctuance. Otherwise soft, minimally tender, non-distended  Extremities:  2+ pitting edema bilateral lower extremities, warm and well perfused. Scattered ecchymoses throughout body with increased skin fragility.       Data Review:  All lab results last 24 hours:    Recent Results (from the past 24 hour(s))   Comprehensive Metabolic Panel    Collection Time: 05/13/18  8:52 AM   Result Value Ref Range    Sodium 131 (L) 135 - 145 mmol/L    Potassium 4.5 3.5 - 5.0 mmol/L    Chloride 94 (L) 98 - 107 mmol/L    CO2 28.0 22.0 - 30.0 mmol/L    Anion Gap 9 9 - 15 mmol/L    BUN 18 7 - 21 mg/dL    Creatinine 2.84 (H) 0.60 - 1.00 mg/dL    BUN/Creatinine Ratio 17     EGFR CKD-EPI Non-African American, Female 57 (L) >=60 mL/min/1.33m2    EGFR CKD-EPI African American, Female 29 >=60 mL/min/1.49m2    Glucose 78 65 - 179 mg/dL    Calcium 8.7 8.5 - 13.2 mg/dL    Albumin 3.4 (L) 3.5 - 5.0 g/dL    Total Protein 6.3 (L) 6.5 - 8.3 g/dL    Total Bilirubin 0.9 0.0 - 1.2 mg/dL    AST 13 (L) 14 - 38 U/L    ALT 38 15 - 48 U/L    Alkaline Phosphatase 128 (H) 38 - 126 U/L   Magnesium Level    Collection Time: 05/13/18  8:52 AM   Result Value Ref Range    Magnesium 1.6 1.6 - 2.2 mg/dL   Gamma GT (GGT)    Collection Time: 05/13/18  8:52 AM   Result Value Ref Range    GGT 105 (H) 11 - 48 U/L   Bilirubin, Direct    Collection Time: 05/13/18  8:52 AM   Result Value Ref Range    Bilirubin, Direct 0.60 (H) 0.00 - 0.40 mg/dL   Phosphorus Level    Collection Time: 05/13/18  8:52 AM   Result Value Ref Range    Phosphorus 5.0 (H) 2.9 - 4.7 mg/dL   CBC w/ Differential    Collection Time: 05/13/18  8:52 AM   Result Value Ref Range    WBC 5.9 4.5 - 11.0 10*9/L    RBC 3.04 (L) 4.00 - 5.20 10*12/L    HGB 10.6 (L) 13.5 - 16.0 g/dL    HCT 44.0 (L) 10.2 - 46.0 %    MCV 99.9 80.0 - 100.0 fL    MCH 34.8 (H) 26.0 - 34.0 pg    MCHC 34.9 31.0 - 37.0 g/dL    RDW 72.5 (H) 36.6 - 15.0 %    MPV 7.4 7.0 - 10.0 fL    Platelet 469 (H) 150 - 440 10*9/L    Variable HGB Concentration Slight (A) Not Present    Neutrophils % 81.3 %    Lymphocytes % 10.1 %    Monocytes % 4.0 %    Eosinophils % 2.1 %    Basophils % 1.3 %    Absolute Neutrophils 4.8 2.0 - 7.5 10*9/L    Absolute Lymphocytes 0.6 (L) 1.5 - 5.0 10*9/L    Absolute Monocytes 0.2 0.2 - 0.8 10*9/L    Absolute Eosinophils 0.1 0.0 - 0.4 10*9/L    Absolute Basophils 0.1 0.0 - 0.1 10*9/L    Large Unstained Cells 1 0 - 4 %    Macrocytosis Marked (A) Not Present    Anisocytosis Moderate (A) Not Present    Hyperchromasia Slight (  A) Not Present         Imaging:  CXR and spinal imaging pending.

## 2018-05-13 NOTE — Unmapped (Signed)
Communicated to patient that insurance requires her to use [facility] for prograf and myfortic per 7/31 referral. She wants to keep all other meds from [facility]. Since she will still be filling valcyte which is considered specialty at [facility], will still be enrolled in SOT queu and outreach calls. Will message clinic about this as well.    Also, patient states mgplus was changed to plain magox 400mg  and she runs out tomorrow. I see this in epic, but we did not receive an rx. Patient is aware that we must receive new rx by 2pm today in order for tomorrow delivery. If she does not receive shipment tomorrow, she is aware to purchase OTC for the weekend, and we will send out magox as rx as soon as we get new rx in. Will message clinic today.

## 2018-05-14 MED ORDER — PROGRAF 1 MG CAPSULE: 7 mg | capsule | Freq: Two times a day (BID) | 11 refills | 0 days

## 2018-05-14 MED ORDER — PROGRAF 1 MG CAPSULE
ORAL_CAPSULE | Freq: Two times a day (BID) | ORAL | 11 refills | 0.00000 days
Start: 2018-05-14 — End: 2018-05-14

## 2018-05-14 NOTE — Unmapped (Signed)
Current Outpatient Medications on File Prior to Visit   Medication Sig   ??? acetaminophen (TYLENOL) 325 MG tablet Take 2 tablets (650 mg total) by mouth every four (4) hours as needed for pain.   ??? aspirin (ECOTRIN) 81 MG tablet Take 1 tablet (81 mg total) by mouth daily.   ??? calcium carbonate-vitamin D3 600 mg(1,500mg ) -200 unit per tablet Take 1 tablet by mouth daily.    ??? cephalexin (KEFTAB) 500 mg tablet Take 1 tablet (500 mg total) by mouth Four (4) times a day. for 5 days   ??? docusate sodium (COLACE) 100 MG capsule Take 1 capsule (100 mg total) by mouth two (2) times a day as needed for constipation.   ??? furosemide (LASIX) 40 MG tablet Take 1 tablet (40 mg total) by mouth daily.   ??? golimumab (SIMPONI) 50 mg/0.5 mL PnIj pen injector HOLD   ??? levothyroxine (SYNTHROID) 75 MCG tablet Take 1 tablet (75 mcg total) by mouth daily.   ??? magnesium oxide (MAG-OX) 400 mg (241.3 mg magnesium) tablet Take 1 tablet (400 mg total) by mouth Two (2) times a day.   ??? mycophenolate (MYFORTIC) 180 MG EC tablet Take 3 tablets (540 mg total) by mouth Two (2) times a day.   ??? omeprazole (PRILOSEC) 40 MG capsule Take 1 capsule (40 mg total) by mouth Two (2) times a day.   ??? predniSONE (DELTASONE) 10 MG tablet Take 2 tablets (20 mg total) by mouth daily.   ??? PROGRAF 1 mg capsule Take 7 capsules (7 mg total) by mouth two (2) times a day.   ??? sulfamethoxazole-trimethoprim (BACTRIM,SEPTRA) 400-80 mg per tablet Take 1 tablet (80 mg of trimethoprim total) by mouth 3 (three) times a week.   ??? traMADol (ULTRAM) 50 mg tablet Take 1 tablet (50 mg total) by mouth every four (4) hours as needed for pain.   ??? ursodiol (ACTIGALL) 300 mg capsule Take 1 capsule (300 mg total) by mouth Two (2) times a day.   ??? valGANciclovir (VALCYTE) 450 mg tablet Take 1 tablet (450 mg total) by mouth daily.     No current facility-administered medications on file prior to visit.      No Known Allergies

## 2018-05-14 NOTE — Unmapped (Signed)
Addended by: Genia Harold on: 05/14/2018 11:22 AM     Modules accepted: Orders

## 2018-05-14 NOTE — Unmapped (Signed)
Patient seen in clinic today for f/u after rheumatologist reported significant rt.pleural effusion and fractured lumbar vertebrae. She reports she is doing better and has been managing pain atc with tylenol and tramadol. She reports she is attempting to eat more frequently, and has no c/o n/v/d, fever, SOB, cough or dizziness. LE edema visible from upper shin to feet at +3-4.  She does not c/o of incisional pain today and the area is approximated with mild erythema and no purulent drainage. Dr.Serrano examined wound site and recommended discontinuing keflex after tomorrow's doses. Patient completed cxr today, which noted small fluid collection to rt.base and w/o SOB/cough, Dr.Serrano deferred admission. He approved of continuing her lasix therapy, as long as she remains asymptomatic and approve of her using compression stockings. Will refer to spine center per his request as well. Spent 10 minutes educating patient post liver txp care, including nutrition, fluid management (explanationg of Ted hose use/application) and wound care.

## 2018-05-14 NOTE — Unmapped (Signed)
Current Outpatient Medications on File Prior to Visit   Medication Sig   ??? acetaminophen (TYLENOL) 325 MG tablet Take 2 tablets (650 mg total) by mouth every four (4) hours as needed for pain.   ??? aspirin (ECOTRIN) 81 MG tablet Take 1 tablet (81 mg total) by mouth daily.   ??? calcium carbonate-vitamin D3 600 mg(1,500mg ) -200 unit per tablet Take 1 tablet by mouth daily.    ??? cephalexin (KEFTAB) 500 mg tablet Take 1 tablet (500 mg total) by mouth Four (4) times a day. for 5 days   ??? docusate sodium (COLACE) 100 MG capsule Take 1 capsule (100 mg total) by mouth two (2) times a day as needed for constipation.   ??? furosemide (LASIX) 40 MG tablet Take 1 tablet (40 mg total) by mouth daily.   ??? golimumab (SIMPONI) 50 mg/0.5 mL PnIj pen injector HOLD   ??? levothyroxine (SYNTHROID) 75 MCG tablet Take 1 tablet (75 mcg total) by mouth daily.   ??? magnesium oxide (MAG-OX) 400 mg (241.3 mg magnesium) tablet Take 1 tablet (400 mg total) by mouth Two (2) times a day.   ??? mycophenolate (MYFORTIC) 180 MG EC tablet Take 3 tablets (540 mg total) by mouth Two (2) times a day.   ??? omeprazole (PRILOSEC) 40 MG capsule Take 1 capsule (40 mg total) by mouth Two (2) times a day.   ??? predniSONE (DELTASONE) 10 MG tablet Take 2 tablets (20 mg total) by mouth daily.   ??? PROGRAF 1 mg capsule Take 7 capsules (7 mg total) by mouth two (2) times a day.   ??? sulfamethoxazole-trimethoprim (BACTRIM,SEPTRA) 400-80 mg per tablet Take 1 tablet (80 mg of trimethoprim total) by mouth 3 (three) times a week.   ??? traMADol (ULTRAM) 50 mg tablet Take 1 tablet (50 mg total) by mouth every four (4) hours as needed for pain.   ??? ursodiol (ACTIGALL) 300 mg capsule Take 1 capsule (300 mg total) by mouth Two (2) times a day.   ??? valGANciclovir (VALCYTE) 450 mg tablet Take 1 tablet (450 mg total) by mouth daily.     No current facility-administered medications on file prior to visit.      No Known Allergies        Gertie Fey, DNP, APRN, FNP-C  [facility] for [facility]  [address]  [place], [place]  [address]

## 2018-05-14 NOTE — Unmapped (Addendum)
Referral made for Spine Ctr. Spine films ordered, but only cxr completed yesterday, which was verified by patient. Directed her to complete xrays at [facility] on Monday, when she goes for her lab draw. She verbalized agreement. Let her know that she should expect a f/u call from the spine ctr within the week, but to let coordinator know if this did not occur. Txp pharmacist, Dr.Lee aware of yesterday's tac trough and recommended no change in yesterday's plan, which reduced her dose to 7mg  bid.

## 2018-05-14 NOTE — Unmapped (Signed)
Addended by: Genia Harold on: 05/14/2018 04:17 PM     Modules accepted: Orders

## 2018-05-14 NOTE — Unmapped (Signed)
Error

## 2018-05-14 NOTE — Unmapped (Signed)
[facility] CLINIC PHARMACY NOTE  05/13/2018   Sara Sanders  [id number]    Medication changes today:   1. CHANGE: tacrolimus from 8mg  twice daily to 7mg  twice daily.   2. Change cephalexin duration to 5 days instead of 10 days  3. Changed med list: Levothyroxine tab, 1 tablet once daily (monitored by PCP)  2. Tramadol 50mg  tab, 1 tab Q4H PRN started for pain by Rheumatologist. Alternating with acetaminophen  3. DISCONTINUE: oxycodone, miralax    Education/Adherence tools provided today:  1.provided additional education on immunosuppression and transplant related medications including reviewing indications of medications, dosing and side effects     Follow up items:  1. goal of understanding indications and dosing of immunosuppression medications   2. Monitor: electrolytes - potassium  3. Monitor renal function: Scr trending up rapidly    Next visit with pharmacy in 1-2 weeks  ____________________________________________________________________    Sara Sanders is a 62 y.o. 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     Other PMH significant for ascites, esophageal varices, hypothyroidism, RA, HTN  Seen by pharmacy today for: medication management; last seen by pharmacy 3 days ago- at that time had significant joint pain and IS was increased at that time    CC: LE swelling, joint pain is much improved    Vitals:    05/13/18 1205   BP: 108/48   Pulse: 76   Temp: 36.6 ??C (97.9 ??F)   SpO2: 96%       No Known Allergies    All medications reviewed and updated.     Medication list includes revisions made during today???s encounter    Outpatient Encounter Medications as of 05/13/2018   Medication Sig Dispense Refill   ??? acetaminophen (TYLENOL) 325 MG tablet Take 2 tablets (650 mg total) by mouth every four (4) hours as needed for pain. 100 tablet 2   ??? aspirin (ECOTRIN) 81 MG tablet Take 1 tablet (81 mg total) by mouth daily. 30 tablet 11   ??? calcium carbonate-vitamin D3 600 mg(1,500mg ) -200 unit per tablet Take 1 tablet by mouth daily.      ??? cephalexin (KEFTAB) 500 mg tablet Take 1 tablet (500 mg total) by mouth Four (4) times a day. for 5 days 20 tablet 0   ??? docusate sodium (COLACE) 100 MG capsule Take 1 capsule (100 mg total) by mouth two (2) times a day as needed for constipation. 60 capsule 0   ??? furosemide (LASIX) 40 MG tablet Take 1 tablet (40 mg total) by mouth daily. 30 tablet 1   ??? golimumab (SIMPONI) 50 mg/0.5 mL PnIj pen injector HOLD  0   ??? magnesium oxide (MAG-OX) 400 mg (241.3 mg magnesium) tablet Take 1 tablet (400 mg total) by mouth Two (2) times a day. 60 tablet 11   ??? mycophenolate (MYFORTIC) 180 MG EC tablet Take 3 tablets (540 mg total) by mouth Two (2) times a day. 180 tablet 11   ??? omeprazole (PRILOSEC) 40 MG capsule Take 1 capsule (40 mg total) by mouth Two (2) times a day. 60 capsule 0   ??? predniSONE (DELTASONE) 10 MG tablet Take 2 tablets (20 mg total) by mouth daily. 60 tablet 1   ??? PROGRAF 1 mg capsule Take 8 capsules (8 mg total) by mouth two (2) times a day. 480 capsule 11   ??? sulfamethoxazole-trimethoprim (BACTRIM,SEPTRA) 400-80 mg per tablet Take 1 tablet (80 mg of trimethoprim total) by mouth 3 (  three) times a week. 12 tablet 5   ??? ursodiol (ACTIGALL) 300 mg capsule Take 1 capsule (300 mg total) by mouth Two (2) times a day. 60 capsule 0   ??? valGANciclovir (VALCYTE) 450 mg tablet Take 1 tablet (450 mg total) by mouth daily. 30 tablet 2   ??? [DISCONTINUED] cephalexin (KEFTAB) 500 mg tablet Take 1 tablet (500 mg total) by mouth Four (4) times a day. for 10 days 40 tablet 0   ??? [DISCONTINUED] levothyroxine (SYNTHROID, LEVOTHROID) 75 MCG tablet 75 mcg. Take 1 tablet (75 mcg) by mouth once daily.     ??? [DISCONTINUED] magnesium oxide (MAG-OX) 400 mg (241.3 mg magnesium) tablet Take 1 tablet (400 mg total) by mouth Two (2) times a day. 60 tablet 11   ??? [DISCONTINUED] polyethylene glycol (MIRALAX) 17 gram packet Take 17 g by mouth daily as needed. 30 each 0   ??? levothyroxine (SYNTHROID) 75 MCG tablet Take 1 tablet (75 mcg total) by mouth daily. 30 tablet 11   ??? traMADol (ULTRAM) 50 mg tablet Take 1 tablet (50 mg total) by mouth every four (4) hours as needed for pain. 180 tablet 0   ??? [DISCONTINUED] ciprofloxacin HCl (CIPRO) 500 MG tablet Take 1 tablet (500 mg total) by mouth Two (2) times a day. for 5 days 10 tablet 0   ??? [DISCONTINUED] oxyCODONE (ROXICODONE) 5 MG immediate release tablet Take 1-2 tablets (5-10 mg total) by mouth every six (6) hours as needed. for up to 7 days 50 tablet 0   ??? [DISCONTINUED] predniSONE (DELTASONE) 5 MG tablet 7/24: 15mg  (3 tabs) at 9AM. 7/25: 10mg  (2 tabs) at 9AM. 7/26 onward: 5mg  (1 tab) daily. 35 tablet 11     No facility-administered encounter medications on file as of 05/13/2018.      Induction agent : steroids only    CURRENT IMMUNOSUPPRESSION: tacrolimus 8 mg PO BID  prograf/cyclosporine goal: 8-10   myfortic540  mg PO bid    prednisone 20mg  once daily  Patient's PMH includes rheumatoid arthritis and was previously taking golimumab. Medication was stopped post-transplant. Patient experienced significant RA flare up and steroid and myfortic dose was increased to help treat flare.  Patient is tolerating immunosuppression well    IMMUNOSUPPRESSION DRUG LEVELS:  Lab Results   Component Value Date    TACROLIMUS 6.2 05/13/2018    TACROLIMUS 14.0 05/12/2018    TACROLIMUS 9.3 05/10/2018     No results found for: CYCLO  No results found for: EVEROLIMUS  No results found for: SIROLIMUS    Prograf is an accurate 24 hour level - patient was instructed to hold 7/31 PM dose after lvl came back supratherapeutic. Since the 24-hr tacro level is 6.2, we suspect patient's 12-hour tac lvl is still supratherapeutic.     Graft function: stable - although Scr trending up. Patient currently receiving lasix 40mg  daily for LE swelling, which could have contributed to the significant creatinine jump on 05/12/2018.  DSA: ntd  Biopsies to date:   ?? 04/28/2018 during transplant procedure: active mixed cirrhosis, consistent with but not diagnostic of autoimmune hepatitis; multiple PAS-diastase-positive granules and globules; cavernous hemangioma; multiple von meyenburg complexes  WBC/ANC:  wnl  Plan: Will decrease tacrolimus dose to 7mg  BID. Will monitor closely.    ID prophylaxis:   CMV Status: D-/ R+, moderate risk. CMV prophylaxis: valganciclovir 450 mg daily x 3 months per protocol.  No results found for: CMVCP  PCP Prophylaxis: bactrim SS 1 tab MWF x 6 months.  Thrush:  completed in hospital  Patient is  tolerating infectious prophylaxis well    Plan: Continue per protocol. Continue to monitor.    CV Prophylaxis: asa 81 mg   The 10-year ASCVD risk score Denman George DC Jr., et al., 2013) is: 3.5%  Statin therapy: Not indicated  Plan: Continue to monitor     BP: Goal < 140/90. Clinic vitals reported above  Home BP ranges: 110-120/70-80  Current meds include: furosemide 40mg  once daily (mostly for lower extremity swelling)  Plan: Monitor Scr and renal function. Continue to monitor    Anemia:  H/H:   Lab Results   Component Value Date    HGB 10.6 (L) 05/13/2018     Lab Results   Component Value Date    HCT 30.3 (L) 05/13/2018     Iron panel:  Lab Results   Component Value Date    IRON 82 12/08/2017    TIBC  12/08/2017      Comment:      Unable to calculate.      FERRITIN 539.0 (H) 12/08/2017     Lab Results   Component Value Date    LABIRON  12/08/2017      Comment:      Unable to calculate.       Prior ESA use: none  Plan: Continue to monitor.     DM:   Lab Results   Component Value Date    A1C 4.2 (L) 04/27/2018   . Goal A1c < 7  History of Dm? No  Established with endocrinologist/PCP for BG managment? No  Hypoglycemia: no  Plan:  Continue to monitor.    Electrolytes: wnl - but potassium is trending upward quite rapidly  Meds currently on: none  Plan: Continue to monitor     GI/BM: Pt reports 1-2BM  Meds currently on: miralax - not taking  Plan: remove miralax from med list. Continue to monitor    Pain: pt reports moderate pain  Meds currently on: acetaminophen 650mg  Q8H PRN alternating with tramadol 50mg  Q4H PRN- this regimen is per RA provider to help with joint pain since pt can not take golimumab at this time.   Plan: Encouraged using acetaminophen for mild to moderate pain, no more than 3000mg  daily. Continue to monitor    Bone health:   Vitamin D Level: none available. Goal > 30.   Last DEXA results:  none available  Current meds include: calcium carb-vitamin D3 600mg -200 units/tab: 1 tab once daily  Plan: Vitamin D level  needs to be drawn with next lab schedule,recommend supplementation. Continue to monitor.     Women's/Men's Health:  Sara Sanders is a 62 y.o. Female perimenopausal. Patient reports no men's/women's health issues  Plan: Continue to monitor    Other: RA- defer to local rheumatology for treatment plan. Pt has been on golimumab, but d/t increased risk of infection at this time, we will not be restarting it  Flares are being managed by increased prednisone and myfortic + tramadol and tylenol.   Will need to reassess initiating golimumab around 3 months post txp    Adherence: Patient has average understanding of medications; was able to independently identify names/doses of immunosuppressants and OI meds.  Patient  does fill their own pill box on a regular basis at home. - husband fills med box and is primary caretaker of medications  Patient brought medication card:yes  Pill box:did not bring  Plan: provided basic adherence counseling/intervention    Spent approximately 40 minutes on  educating this patient and greater than 50% was spent in direct face to face counseling regarding post transplant medication education. Questions and concerns were address to patient's satisfaction.    Patient was reviewed with Dr. Matilde Haymaker who was agreement with the stated plan:     During this visit, the following was completed:   BP log data assessment  Labs ordered and evaluated complex treatment plan >1 DS   Patient education was completed for 11-24 minutes     All questions/concerns were addressed to the patient's satisfaction.  __________________________________________  Lucy Chris, PharmD  PGY-1 Pharmacy Resident  PAGER [phone]    PATIENT SEEN AND EVALUATED BY:  Noah Charon, PHARMD, CPP  [employer] TR[employer]ANT  PAGER [phone]

## 2018-05-17 ENCOUNTER — Ambulatory Visit: Admit: 2018-05-17 | Discharge: 2018-05-18 | Payer: PRIVATE HEALTH INSURANCE

## 2018-05-17 DIAGNOSIS — M545 Low back pain: Principal | ICD-10-CM

## 2018-05-17 DIAGNOSIS — M542 Cervicalgia: Secondary | ICD-10-CM

## 2018-05-17 DIAGNOSIS — Z944 Liver transplant status: Principal | ICD-10-CM

## 2018-05-17 DIAGNOSIS — Z79899 Other long term (current) drug therapy: Secondary | ICD-10-CM

## 2018-05-17 DIAGNOSIS — J90 Pleural effusion, not elsewhere classified: Secondary | ICD-10-CM

## 2018-05-17 LAB — CBC W/ AUTO DIFF
BASOPHILS ABSOLUTE COUNT: 0 10*9/L (ref 0.0–0.1)
BASOPHILS RELATIVE PERCENT: 1 %
EOSINOPHILS ABSOLUTE COUNT: 0.1 10*9/L (ref 0.0–0.4)
EOSINOPHILS RELATIVE PERCENT: 2.1 %
HEMATOCRIT: 32.1 % — ABNORMAL LOW (ref 36.0–46.0)
HEMOGLOBIN: 11.1 g/dL — ABNORMAL LOW (ref 13.5–16.0)
LARGE UNSTAINED CELLS: 1 % (ref 0–4)
LYMPHOCYTES RELATIVE PERCENT: 17.2 %
MEAN CORPUSCULAR HEMOGLOBIN CONC: 34.5 g/dL (ref 31.0–37.0)
MEAN CORPUSCULAR HEMOGLOBIN: 34.4 pg — ABNORMAL HIGH (ref 26.0–34.0)
MEAN CORPUSCULAR VOLUME: 99.7 fL (ref 80.0–100.0)
MEAN PLATELET VOLUME: 7.1 fL (ref 7.0–10.0)
MONOCYTES ABSOLUTE COUNT: 0.3 10*9/L (ref 0.2–0.8)
MONOCYTES RELATIVE PERCENT: 6.9 %
NEUTROPHILS ABSOLUTE COUNT: 2.9 10*9/L (ref 2.0–7.5)
NEUTROPHILS RELATIVE PERCENT: 71.5 %
PLATELET COUNT: 473 10*9/L — ABNORMAL HIGH (ref 150–440)
RED CELL DISTRIBUTION WIDTH: 18.9 % — ABNORMAL HIGH (ref 12.0–15.0)
WBC ADJUSTED: 4.1 10*9/L — ABNORMAL LOW (ref 4.5–11.0)

## 2018-05-17 LAB — COMPREHENSIVE METABOLIC PANEL
ALBUMIN: 3.6 g/dL (ref 3.5–5.0)
ALKALINE PHOSPHATASE: 120 U/L (ref 38–126)
ANION GAP: 9 mmol/L (ref 9–15)
AST (SGOT): 13 U/L — ABNORMAL LOW (ref 14–38)
BILIRUBIN TOTAL: 0.7 mg/dL (ref 0.0–1.2)
BLOOD UREA NITROGEN: 14 mg/dL (ref 7–21)
BUN / CREAT RATIO: 15
CALCIUM: 9.1 mg/dL (ref 8.5–10.2)
CHLORIDE: 94 mmol/L — ABNORMAL LOW (ref 98–107)
CO2: 29 mmol/L (ref 22.0–30.0)
CREATININE: 0.94 mg/dL (ref 0.60–1.00)
EGFR CKD-EPI AA FEMALE: 76 mL/min/{1.73_m2} (ref >=60)
GLUCOSE RANDOM: 68 mg/dL (ref 65–179)
POTASSIUM: 5 mmol/L (ref 3.5–5.0)
PROTEIN TOTAL: 6.4 g/dL — ABNORMAL LOW (ref 6.5–8.3)
SODIUM: 132 mmol/L — ABNORMAL LOW (ref 135–145)

## 2018-05-17 LAB — GLUCOSE RANDOM: Glucose:MCnc:Pt:Ser/Plas:Qn:: 68

## 2018-05-17 LAB — BILIRUBIN DIRECT: Bilirubin.glucuronidated:MCnc:Pt:Ser/Plas:Qn:: 0.5 — ABNORMAL HIGH

## 2018-05-17 LAB — TACROLIMUS BLOOD: Lab: 7.4

## 2018-05-17 LAB — PHOSPHORUS: Phosphate:MCnc:Pt:Ser/Plas:Qn:: 4.7

## 2018-05-17 LAB — MAGNESIUM: Magnesium:MCnc:Pt:Ser/Plas:Qn:: 1.8

## 2018-05-17 LAB — NEUTROPHILS RELATIVE PERCENT: Lab: 71.5

## 2018-05-17 LAB — GAMMA GLUTAMYL TRANSFERASE: Gamma glutamyl transferase:CCnc:Pt:Ser/Plas:Qn:: 89 — ABNORMAL HIGH

## 2018-05-18 NOTE — Unmapped (Signed)
Patient's 8/5 tac level still subpar. Reviewed with txp pharmacist, Dr.Lee, who recommended no intervention. Will monitor with tomorrow's lab results.

## 2018-05-19 ENCOUNTER — Ambulatory Visit: Admit: 2018-05-19 | Discharge: 2018-05-20 | Payer: PRIVATE HEALTH INSURANCE

## 2018-05-19 DIAGNOSIS — Z944 Liver transplant status: Principal | ICD-10-CM

## 2018-05-19 DIAGNOSIS — Z79899 Other long term (current) drug therapy: Secondary | ICD-10-CM

## 2018-05-19 LAB — COMPREHENSIVE METABOLIC PANEL
ALBUMIN: 3.6 g/dL (ref 3.5–5.0)
ALKALINE PHOSPHATASE: 107 U/L (ref 38–126)
ALT (SGPT): 28 U/L (ref 15–48)
ANION GAP: 10 mmol/L (ref 9–15)
AST (SGOT): 13 U/L — ABNORMAL LOW (ref 14–38)
BILIRUBIN TOTAL: 0.8 mg/dL (ref 0.0–1.2)
BLOOD UREA NITROGEN: 12 mg/dL (ref 7–21)
BUN / CREAT RATIO: 13
CALCIUM: 8.9 mg/dL (ref 8.5–10.2)
CHLORIDE: 96 mmol/L — ABNORMAL LOW (ref 98–107)
CO2: 27 mmol/L (ref 22.0–30.0)
CREATININE: 0.89 mg/dL (ref 0.60–1.00)
EGFR CKD-EPI AA FEMALE: 81 mL/min/{1.73_m2} (ref >=60)
POTASSIUM: 3.7 mmol/L (ref 3.5–5.0)
PROTEIN TOTAL: 6.4 g/dL — ABNORMAL LOW (ref 6.5–8.3)
SODIUM: 133 mmol/L — ABNORMAL LOW (ref 135–145)

## 2018-05-19 LAB — CBC W/ AUTO DIFF
BASOPHILS ABSOLUTE COUNT: 0.1 10*9/L (ref 0.0–0.1)
EOSINOPHILS ABSOLUTE COUNT: 0.1 10*9/L (ref 0.0–0.4)
EOSINOPHILS RELATIVE PERCENT: 1.8 %
HEMATOCRIT: 31.6 % — ABNORMAL LOW (ref 36.0–46.0)
HEMOGLOBIN: 10.7 g/dL — ABNORMAL LOW (ref 13.5–16.0)
LARGE UNSTAINED CELLS: 1 % (ref 0–4)
LYMPHOCYTES ABSOLUTE COUNT: 0.7 10*9/L — ABNORMAL LOW (ref 1.5–5.0)
LYMPHOCYTES RELATIVE PERCENT: 12.1 %
MEAN CORPUSCULAR HEMOGLOBIN CONC: 33.7 g/dL (ref 31.0–37.0)
MEAN CORPUSCULAR HEMOGLOBIN: 33.8 pg (ref 26.0–34.0)
MEAN CORPUSCULAR VOLUME: 100.1 fL — ABNORMAL HIGH (ref 80.0–100.0)
MEAN PLATELET VOLUME: 7 fL (ref 7.0–10.0)
MONOCYTES ABSOLUTE COUNT: 0.3 10*9/L (ref 0.2–0.8)
MONOCYTES RELATIVE PERCENT: 4.6 %
NEUTROPHILS ABSOLUTE COUNT: 4.9 10*9/L (ref 2.0–7.5)
NEUTROPHILS RELATIVE PERCENT: 79.5 %
PLATELET COUNT: 445 10*9/L — ABNORMAL HIGH (ref 150–440)
RED BLOOD CELL COUNT: 3.16 10*12/L — ABNORMAL LOW (ref 4.00–5.20)
RED CELL DISTRIBUTION WIDTH: 18.9 % — ABNORMAL HIGH (ref 12.0–15.0)

## 2018-05-19 LAB — MAGNESIUM: Magnesium:MCnc:Pt:Ser/Plas:Qn:: 1.8

## 2018-05-19 LAB — TACROLIMUS LEVEL: TACROLIMUS BLOOD: 4.7 ng/mL

## 2018-05-19 LAB — GAMMA GT: GAMMA GLUTAMYL TRANSFERASE: 79 U/L — ABNORMAL HIGH (ref 11–48)

## 2018-05-19 LAB — PHOSPHORUS: Phosphate:MCnc:Pt:Ser/Plas:Qn:: 4.5

## 2018-05-19 LAB — SMEAR REVIEW: Lab: 0

## 2018-05-19 LAB — EOSINOPHILS RELATIVE PERCENT: Lab: 1.8

## 2018-05-19 LAB — GLUCOSE RANDOM: Glucose:MCnc:Pt:Ser/Plas:Qn:: 71

## 2018-05-19 LAB — GAMMA GLUTAMYL TRANSFERASE: Gamma glutamyl transferase:CCnc:Pt:Ser/Plas:Qn:: 79 — ABNORMAL HIGH

## 2018-05-19 LAB — BILIRUBIN DIRECT: Bilirubin.glucuronidated:MCnc:Pt:Ser/Plas:Qn:: 0.5 — ABNORMAL HIGH

## 2018-05-19 LAB — TACROLIMUS BLOOD: Lab: 4.7

## 2018-05-19 MED ORDER — FUROSEMIDE 40 MG TABLET: 40 mg | tablet | Freq: Every day | 1 refills | 0 days | Status: AC

## 2018-05-19 MED ORDER — FUROSEMIDE 40 MG TABLET
ORAL_TABLET | Freq: Every day | ORAL | 1 refills | 0.00000 days | Status: CP
Start: 2018-05-19 — End: 2018-05-19

## 2018-05-19 MED ORDER — PREDNISONE 10 MG TABLET
ORAL_TABLET | Freq: Every day | ORAL | 1 refills | 0.00000 days | Status: CP
Start: 2018-05-19 — End: 2018-05-19
  Filled 2018-06-02: qty 60, 30d supply, fill #0

## 2018-05-19 MED ORDER — URSODIOL 300 MG CAPSULE: 300 mg | capsule | Freq: Two times a day (BID) | 11 refills | 0 days | Status: AC

## 2018-05-19 MED ORDER — URSODIOL 300 MG CAPSULE
Freq: Two times a day (BID) | ORAL | 11 refills | 0.00000 days | Status: CP
Start: 2018-05-19 — End: 2018-05-26

## 2018-05-19 MED ORDER — PREDNISONE 10 MG TABLET: 20 mg | tablet | Freq: Every day | 1 refills | 0 days | Status: AC

## 2018-05-19 NOTE — Unmapped (Addendum)
8/7 further update: pt called back and said she sees Sara Sanders for tramadol at [phone]. I called that number and left a message with Sara Sanders requesting this refill - she will forward on to doctor-EF    8/7 update: txp clinic denied tramadol rx request, states that she needs to get that from her rheumatologist. Don't seem a rheum MD listed in epic, called pt to find out who to contact for refills but had to leave a message. Messaging txp clinic back to see if they know who her rheumatologist is-EF    8/6 update: also requested new rx prednisone from within this note of the 10mg  tablets, and refill on ursodiol as stand alone epic message since out of refills-EF      [facility] Pharm[facility] Refill and Clinical Coordination Note  Medication(s): URSODIOL, VALGANCICLOVIR  Went over all meds with pt on call today. Besides meds listed in this note, pt denies needing ANYTHING else filled at this time. Pt aware to call us with any needs/concerns/refills if need arises prior to our next scheduled refill call.  Sara Sanders states they have already set up to get prograf and myfortic from [facility] per ins requirements.      Sara Sanders, DOB: 1956-03-23  Phone: [phone] (home) , Alternate phone contact: N/A  Shipping address: [address]  [place] [address]  Phone or address changes today?: No  All above HIPAA information verified.  Insurance changes? No    Completed refill and clinical call assessment today to schedule patient's medication shipment from the [facility] [phone]).      MEDICATION RECONCILIATION    Confirmed the medication and dosage are correct and have not changed: Yes, regimen is correct and unchanged.    Were there any changes to your medication(s) in the past month:  No, there are no changes reported at this time.    ADHERENCE    Is this medicine transplant or covered by Medicare Part B? No.    Did you miss any doses in the past 4 weeks? No missed doses reported. Adherence counseling provided? Not needed     SIDE EFFECT MANAGEMENT    Are you tolerating your medication?:  Sara Sanders reports side effects of diarrhea last couple days - they already contacted coordinator and she is checking with doctor and getting back to them this afternoon to see if stool sample is needed - i will also message coordinator.  Side effect management discussed: see above      Therapy is appropriate and should be continued.    Evidence of clinical benefit: See Epic note from 05/13/18      FINANCIAL/SHIPPING    Delivery Scheduled: Yes, Expected medication delivery date: 05/28/18 via ups   Additional medications refilled: prednisone, valgan, sulfa, omep, ursodiol, lasix 9need rx), magox, apap, tramadol (need rx)    The patient will receive an [facility]SI print out for each medication shipped and additional FDA Medication Guides as required.  Patient education from Sara Sanders or Sara Sanders may also be included in the shipment.    Sara Sanders did not have any additional questions at this time.    Delivery address validated in FSI scheduling system: Yes, address listed above is correct.      We will follow up with patient monthly for standard refill processing and delivery.      Thank you,  Sara Sanders   [facility] Specialty Pharmacist

## 2018-05-19 NOTE — Unmapped (Signed)
Epic [facility]) medication reconciliation is completed.

## 2018-05-19 NOTE — Unmapped (Addendum)
Patient called to say that she had started having a daily episode of large uncontrolled diarrhea over the weekend. She denied any n/v/fever/bleeding or cramping with it. Discussed with PA Vonderau who recommended she complete a CDiff and GI pathogen panel, since she had just completed taking abx for her incision. Spoke with patient and instructed her to have husband obtain specimen cup and speci-pan from [facility] for stool testing. Explained that sample would need to be <24 hrs old and refrigerated after obtaining it. Confirmed that the outpt lab provided containers. She verbalized understanding.

## 2018-05-20 MED ORDER — TRAMADOL 50 MG TABLET
ORAL_TABLET | ORAL | 1 refills | 0 days
Start: 2018-05-20 — End: 2018-08-09

## 2018-05-20 MED ORDER — PROGRAF 1 MG CAPSULE
ORAL_CAPSULE | Freq: Two times a day (BID) | ORAL | 11 refills | 0 days
Start: 2018-05-20 — End: 2018-05-27

## 2018-05-20 NOTE — Unmapped (Signed)
All spinal imaging reviewed by Dr.Serrano. Followed up on [facility] today, appt was made for 8/21 with NP Judson. Images forwarded to her for review. Patient's 8/7 tac level low. Directed by txp pharmacist, [name] to increase dose back to 8mg  bid. Contacted patient and instructed her to change her tac dose to 8mg  bid, directing her to change her orange card and pill box right away. She verbalized understanding.

## 2018-05-20 NOTE — Unmapped (Signed)
Patient called to inquire about using different containers for her stool samples, which she had already approved with the [facility] lab. Let her know if was approved by the lab, it would be fine to use. She verbalized understanding.

## 2018-05-21 ENCOUNTER — Ambulatory Visit: Admit: 2018-05-21 | Discharge: 2018-05-22 | Payer: PRIVATE HEALTH INSURANCE

## 2018-05-21 DIAGNOSIS — D899 Disorder involving the immune mechanism, unspecified: Principal | ICD-10-CM

## 2018-05-21 DIAGNOSIS — Z79899 Other long term (current) drug therapy: Secondary | ICD-10-CM

## 2018-05-21 DIAGNOSIS — R197 Diarrhea, unspecified: Secondary | ICD-10-CM

## 2018-05-21 DIAGNOSIS — Z944 Liver transplant status: Secondary | ICD-10-CM

## 2018-05-21 LAB — COMPREHENSIVE METABOLIC PANEL
ALBUMIN: 3.7 g/dL (ref 3.5–5.0)
ALKALINE PHOSPHATASE: 96 U/L (ref 38–126)
ALT (SGPT): 26 U/L (ref 15–48)
ANION GAP: 9 mmol/L (ref 9–15)
AST (SGOT): 12 U/L — ABNORMAL LOW (ref 14–38)
BILIRUBIN TOTAL: 0.7 mg/dL (ref 0.0–1.2)
BLOOD UREA NITROGEN: 15 mg/dL (ref 7–21)
BUN / CREAT RATIO: 17
CALCIUM: 9.1 mg/dL (ref 8.5–10.2)
CHLORIDE: 97 mmol/L — ABNORMAL LOW (ref 98–107)
CO2: 29 mmol/L (ref 22.0–30.0)
CREATININE: 0.88 mg/dL (ref 0.60–1.00)
EGFR CKD-EPI AA FEMALE: 83 mL/min/{1.73_m2} (ref >=60)
EGFR CKD-EPI NON-AA FEMALE: 72 mL/min/{1.73_m2} (ref >=60)
GLUCOSE RANDOM: 105 mg/dL (ref 65–179)
POTASSIUM: 4.6 mmol/L (ref 3.5–5.0)

## 2018-05-21 LAB — CBC W/ AUTO DIFF
BASOPHILS ABSOLUTE COUNT: 0 10*9/L (ref 0.0–0.1)
BASOPHILS RELATIVE PERCENT: 0.3 %
EOSINOPHILS ABSOLUTE COUNT: 0 10*9/L (ref 0.0–0.4)
HEMATOCRIT: 30.3 % — ABNORMAL LOW (ref 36.0–46.0)
LARGE UNSTAINED CELLS: 1 % (ref 0–4)
LYMPHOCYTES ABSOLUTE COUNT: 0.6 10*9/L — ABNORMAL LOW (ref 1.5–5.0)
MEAN CORPUSCULAR HEMOGLOBIN CONC: 34.1 g/dL (ref 31.0–37.0)
MEAN CORPUSCULAR HEMOGLOBIN: 33.9 pg (ref 26.0–34.0)
MEAN CORPUSCULAR VOLUME: 99.4 fL (ref 80.0–100.0)
MEAN PLATELET VOLUME: 7.4 fL (ref 7.0–10.0)
MONOCYTES ABSOLUTE COUNT: 0.2 10*9/L (ref 0.2–0.8)
MONOCYTES RELATIVE PERCENT: 5.2 %
NEUTROPHILS ABSOLUTE COUNT: 3.4 10*9/L (ref 2.0–7.5)
NEUTROPHILS RELATIVE PERCENT: 77.7 %
PLATELET COUNT: 377 10*9/L (ref 150–440)
RED BLOOD CELL COUNT: 3.05 10*12/L — ABNORMAL LOW (ref 4.00–5.20)
RED CELL DISTRIBUTION WIDTH: 18.7 % — ABNORMAL HIGH (ref 12.0–15.0)
WBC ADJUSTED: 4.3 10*9/L — ABNORMAL LOW (ref 4.5–11.0)

## 2018-05-21 LAB — GAMMA GLUTAMYL TRANSFERASE: Gamma glutamyl transferase:CCnc:Pt:Ser/Plas:Qn:: 72 — ABNORMAL HIGH

## 2018-05-21 LAB — TACROLIMUS BLOOD: Lab: 4.2

## 2018-05-21 LAB — MAGNESIUM: Magnesium:MCnc:Pt:Ser/Plas:Qn:: 1.7

## 2018-05-21 LAB — PHOSPHORUS: Phosphate:MCnc:Pt:Ser/Plas:Qn:: 4.2

## 2018-05-21 LAB — MEAN PLATELET VOLUME: Lab: 7.4

## 2018-05-21 LAB — BILIRUBIN DIRECT: Bilirubin.glucuronidated:MCnc:Pt:Ser/Plas:Qn:: 0.5 — ABNORMAL HIGH

## 2018-05-21 LAB — CO2: Carbon dioxide:SCnc:Pt:Ser/Plas:Qn:: 29

## 2018-05-21 MED ORDER — VANCOMYCIN 125 MG CAPSULE
ORAL_CAPSULE | Freq: Four times a day (QID) | ORAL | 0 refills | 0 days | Status: CP
Start: 2018-05-21 — End: 2018-06-10

## 2018-05-22 NOTE — Unmapped (Signed)
Patient's stool testing + for Cdiff. Contacted by PA Vonderau to start her on Vancomycin 125mg  four times daily. Spoke with patient who said she continues to have diarrhea at least once daily. Explained that CDiff can occur after taking abx,especially with  immunocompromised individuals. Discussed importance of observing good handwashing technique.She requested script be sent to her local pharmacy, which is closed now, saying she will pick it up in the morning. Verified that local [facility] has inventory of med now, and she agreed to have rx transferred there if her pharmacy does not have med in stock tomorrow morning.

## 2018-05-24 ENCOUNTER — Ambulatory Visit: Admit: 2018-05-24 | Discharge: 2018-05-25 | Payer: PRIVATE HEALTH INSURANCE

## 2018-05-24 DIAGNOSIS — Z79899 Other long term (current) drug therapy: Secondary | ICD-10-CM

## 2018-05-24 DIAGNOSIS — Z944 Liver transplant status: Principal | ICD-10-CM

## 2018-05-24 LAB — TACROLIMUS BLOOD: Lab: 8.5

## 2018-05-24 LAB — CBC W/ AUTO DIFF
BASOPHILS ABSOLUTE COUNT: 0 10*9/L (ref 0.0–0.1)
BASOPHILS RELATIVE PERCENT: 0.1 %
EOSINOPHILS ABSOLUTE COUNT: 0 10*9/L (ref 0.0–0.4)
EOSINOPHILS RELATIVE PERCENT: 0.8 %
HEMOGLOBIN: 11.6 g/dL — ABNORMAL LOW (ref 13.5–16.0)
LARGE UNSTAINED CELLS: 1 % (ref 0–4)
LYMPHOCYTES ABSOLUTE COUNT: 0.6 10*9/L — ABNORMAL LOW (ref 1.5–5.0)
LYMPHOCYTES RELATIVE PERCENT: 11.6 %
MEAN CORPUSCULAR HEMOGLOBIN CONC: 33.8 g/dL (ref 31.0–37.0)
MEAN CORPUSCULAR HEMOGLOBIN: 33.8 pg (ref 26.0–34.0)
MEAN CORPUSCULAR VOLUME: 99.8 fL (ref 80.0–100.0)
MEAN PLATELET VOLUME: 7.1 fL (ref 7.0–10.0)
MONOCYTES ABSOLUTE COUNT: 0.2 10*9/L (ref 0.2–0.8)
MONOCYTES RELATIVE PERCENT: 3.9 %
NEUTROPHILS ABSOLUTE COUNT: 4 10*9/L (ref 2.0–7.5)
NEUTROPHILS RELATIVE PERCENT: 83.1 %
RED BLOOD CELL COUNT: 3.44 10*12/L — ABNORMAL LOW (ref 4.00–5.20)
RED CELL DISTRIBUTION WIDTH: 18.9 % — ABNORMAL HIGH (ref 12.0–15.0)
WBC ADJUSTED: 4.8 10*9/L (ref 4.5–11.0)

## 2018-05-24 LAB — COMPREHENSIVE METABOLIC PANEL
ALBUMIN: 4.2 g/dL (ref 3.5–5.0)
ALKALINE PHOSPHATASE: 92 U/L (ref 38–126)
ALT (SGPT): 24 U/L (ref 15–48)
ANION GAP: 12 mmol/L (ref 9–15)
AST (SGOT): 15 U/L (ref 14–38)
BILIRUBIN TOTAL: 0.9 mg/dL (ref 0.0–1.2)
BLOOD UREA NITROGEN: 14 mg/dL (ref 7–21)
BUN / CREAT RATIO: 16
CALCIUM: 9.6 mg/dL (ref 8.5–10.2)
CHLORIDE: 97 mmol/L — ABNORMAL LOW (ref 98–107)
CO2: 28 mmol/L (ref 22.0–30.0)
CREATININE: 0.89 mg/dL (ref 0.60–1.00)
EGFR CKD-EPI AA FEMALE: 81 mL/min/{1.73_m2} (ref >=60)
EGFR CKD-EPI NON-AA FEMALE: 71 mL/min/{1.73_m2} (ref >=60)
GLUCOSE RANDOM: 105 mg/dL — ABNORMAL HIGH (ref 65–99)
PROTEIN TOTAL: 7.1 g/dL (ref 6.5–8.3)
SODIUM: 137 mmol/L (ref 135–145)

## 2018-05-24 LAB — RED BLOOD CELL COUNT: Lab: 3.44 — ABNORMAL LOW

## 2018-05-24 LAB — PHOSPHORUS: Phosphate:MCnc:Pt:Ser/Plas:Qn:: 4.3

## 2018-05-24 LAB — PROTEIN TOTAL: Protein:MCnc:Pt:Ser/Plas:Qn:: 7.1

## 2018-05-24 LAB — MAGNESIUM: Magnesium:MCnc:Pt:Ser/Plas:Qn:: 1.8

## 2018-05-24 LAB — GAMMA GLUTAMYL TRANSFERASE: Gamma glutamyl transferase:CCnc:Pt:Ser/Plas:Qn:: 70 — ABNORMAL HIGH

## 2018-05-24 LAB — BILIRUBIN DIRECT: Bilirubin.glucuronidated:MCnc:Pt:Ser/Plas:Qn:: 0.5 — ABNORMAL HIGH

## 2018-05-26 ENCOUNTER — Ambulatory Visit: Admit: 2018-05-26 | Discharge: 2018-05-27 | Payer: PRIVATE HEALTH INSURANCE

## 2018-05-26 DIAGNOSIS — Z79899 Other long term (current) drug therapy: Secondary | ICD-10-CM

## 2018-05-26 DIAGNOSIS — Z944 Liver transplant status: Principal | ICD-10-CM

## 2018-05-26 LAB — COMPREHENSIVE METABOLIC PANEL
ALBUMIN: 4.1 g/dL (ref 3.5–5.0)
ALKALINE PHOSPHATASE: 84 U/L (ref 38–126)
ALT (SGPT): 28 U/L (ref 15–48)
AST (SGOT): 16 U/L (ref 14–38)
BILIRUBIN TOTAL: 0.9 mg/dL (ref 0.0–1.2)
BLOOD UREA NITROGEN: 19 mg/dL (ref 7–21)
BUN / CREAT RATIO: 23
CALCIUM: 9.3 mg/dL (ref 8.5–10.2)
CHLORIDE: 96 mmol/L — ABNORMAL LOW (ref 98–107)
CO2: 27 mmol/L (ref 22.0–30.0)
CREATININE: 0.82 mg/dL (ref 0.60–1.00)
EGFR CKD-EPI AA FEMALE: 90 mL/min/{1.73_m2} (ref >=60)
EGFR CKD-EPI NON-AA FEMALE: 78 mL/min/{1.73_m2} (ref >=60)
GLUCOSE RANDOM: 100 mg/dL (ref 65–179)
POTASSIUM: 4 mmol/L (ref 3.5–5.0)
PROTEIN TOTAL: 6.9 g/dL (ref 6.5–8.3)
SODIUM: 135 mmol/L (ref 135–145)

## 2018-05-26 LAB — CBC W/ AUTO DIFF
BASOPHILS RELATIVE PERCENT: 0.1 %
EOSINOPHILS ABSOLUTE COUNT: 0.1 10*9/L (ref 0.0–0.4)
EOSINOPHILS RELATIVE PERCENT: 1.1 %
HEMATOCRIT: 33.4 % — ABNORMAL LOW (ref 36.0–46.0)
HEMOGLOBIN: 11.6 g/dL — ABNORMAL LOW (ref 13.5–16.0)
LARGE UNSTAINED CELLS: 1 % (ref 0–4)
LYMPHOCYTES ABSOLUTE COUNT: 0.7 10*9/L — ABNORMAL LOW (ref 1.5–5.0)
LYMPHOCYTES RELATIVE PERCENT: 12.2 %
MEAN CORPUSCULAR HEMOGLOBIN CONC: 34.6 g/dL (ref 31.0–37.0)
MEAN CORPUSCULAR HEMOGLOBIN: 34.3 pg — ABNORMAL HIGH (ref 26.0–34.0)
MEAN CORPUSCULAR VOLUME: 99 fL (ref 80.0–100.0)
MEAN PLATELET VOLUME: 7.2 fL (ref 7.0–10.0)
MONOCYTES ABSOLUTE COUNT: 0.3 10*9/L (ref 0.2–0.8)
MONOCYTES RELATIVE PERCENT: 4.6 %
NEUTROPHILS ABSOLUTE COUNT: 4.4 10*9/L (ref 2.0–7.5)
NEUTROPHILS RELATIVE PERCENT: 81.5 %
RED BLOOD CELL COUNT: 3.38 10*12/L — ABNORMAL LOW (ref 4.00–5.20)
RED CELL DISTRIBUTION WIDTH: 18.7 % — ABNORMAL HIGH (ref 12.0–15.0)
WBC ADJUSTED: 5.4 10*9/L (ref 4.5–11.0)

## 2018-05-26 LAB — GAMMA GLUTAMYL TRANSFERASE: Gamma glutamyl transferase:CCnc:Pt:Ser/Plas:Qn:: 63 — ABNORMAL HIGH

## 2018-05-26 LAB — BILIRUBIN DIRECT: Bilirubin.glucuronidated:MCnc:Pt:Ser/Plas:Qn:: 0.4

## 2018-05-26 LAB — CO2: Carbon dioxide:SCnc:Pt:Ser/Plas:Qn:: 27

## 2018-05-26 LAB — TACROLIMUS BLOOD: Lab: 11.1

## 2018-05-26 LAB — MAGNESIUM: Magnesium:MCnc:Pt:Ser/Plas:Qn:: 1.8

## 2018-05-26 LAB — PHOSPHORUS: Phosphate:MCnc:Pt:Ser/Plas:Qn:: 4.4

## 2018-05-26 LAB — WBC ADJUSTED: Lab: 5.4

## 2018-05-26 MED ORDER — URSODIOL 300 MG CAPSULE
Freq: Two times a day (BID) | ORAL | 11 refills | 0.00000 days | Status: CP
Start: 2018-05-26 — End: 2018-05-26
  Filled 2018-05-28: qty 60, 30d supply, fill #0

## 2018-05-26 MED ORDER — URSODIOL 300 MG CAPSULE: 300 mg | each | Freq: Two times a day (BID) | 11 refills | 0 days | Status: AC

## 2018-05-26 NOTE — Unmapped (Signed)
Pt request for RX Refill

## 2018-05-27 ENCOUNTER — Ambulatory Visit: Admit: 2018-05-27 | Discharge: 2018-05-28 | Payer: PRIVATE HEALTH INSURANCE

## 2018-05-27 ENCOUNTER — Ambulatory Visit
Admit: 2018-05-27 | Discharge: 2018-05-28 | Payer: PRIVATE HEALTH INSURANCE | Attending: Student in an Organized Health Care Education/Training Program | Primary: Student in an Organized Health Care Education/Training Program

## 2018-05-27 ENCOUNTER — Ambulatory Visit
Admit: 2018-05-27 | Discharge: 2018-05-28 | Payer: PRIVATE HEALTH INSURANCE | Attending: Pharmacist Clinician (PhC)/ Clinical Pharmacy Specialist | Primary: Pharmacist Clinician (PhC)/ Clinical Pharmacy Specialist

## 2018-05-27 DIAGNOSIS — Z944 Liver transplant status: Principal | ICD-10-CM

## 2018-05-27 DIAGNOSIS — Z79899 Other long term (current) drug therapy: Secondary | ICD-10-CM

## 2018-05-27 MED ORDER — PROGRAF 1 MG CAPSULE
ORAL_CAPSULE | Freq: Two times a day (BID) | ORAL | 11 refills | 0.00000 days
Start: 2018-05-27 — End: 2018-06-22

## 2018-05-27 MED ORDER — MAGNESIUM OXIDE 400 MG (241.3 MG MAGNESIUM) TABLET
ORAL_TABLET | Freq: Every day | ORAL | 11 refills | 0.00000 days
Start: 2018-05-27 — End: 2018-08-02

## 2018-05-27 MED ORDER — MYCOPHENOLATE SODIUM 180 MG TABLET,DELAYED RELEASE
ORAL_TABLET | Freq: Two times a day (BID) | ORAL | 11 refills | 0 days
Start: 2018-05-27 — End: 2019-05-02

## 2018-05-27 NOTE — Unmapped (Signed)
TRANSPLANT SURGERY PROGRESS NOTE    Assessment and Plan    Sara Sanders is a 62 y.o. 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 She had no surgical complications immediately post-operatively.     Recommendations:    #S/p OLT: Doing well overall. Incision with persistent drainage but it does not appear to be infected nor does patient endorse infectious sx. Low concern for incisional cellultis Some CNI related tremors but improving over time. Will continue to monitor.   []  Keep staples in place, reassess at next outpatient appointment in 2  weeks for removal   []  Reduce myfortic to 370 mg qd, was previously increased for RA flare   []  Tacro 7 mg BID, plan to obtain labs tomorrow    []  Bactrim PCP ppx   []  Ursodiol in setting of intraoperative stent     #RA:  T12/L2 compression fracture, borderline widening of anterior atlantodenta interval and grade 1 anterolisthesis at C2-C3. No focal osseous erosion seen on spine. Subjective pain seems to be mostly attributed to her back and wrist pain. DEXA from 07/14/2017 with normal lumbar spin bone density, L proximal femur with osteopenia.    []  Will transition management of pain to spine team, patient has  appointment next week   []  Decrease myfortic as above    #Fluid overload, resolved: Trace pedal edema on exam and lungs CTAB   []  Plan to discontinue lasix 40 mg PO qd and have patient call clinic if she  experiences leg swelling or shortness of breath     #C. Diff colitis, resolving likely secondary to course of ciprofloxacin. Improved BM in past week.    []  Finish course of oral vancomycin    #Mobility: Using wheelchair in hospital, but endorses full mobility at home   []  Continue to monitor    Subjective  Sara Sanders is a 62 y.o. 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 She had no surgical complications immediately post-operatively.     Last seen in clinic 05/13/18 with back pain 2/2 to possible RA flare and lower extremity swelling.     Bps have been low during her last few clinic visits. 108/48 on 8/1/115. Today 112/72. She has been asymptomatic and denies lightheadedness or dizziness. She estimates PO intake at 1 gallon of water/tea per day with multiple voids daily. She was continued on lasix 40 mg PO. She continued lasix 40 mg PO with resolution of her LE swelling. Patient has never had pulmonary symptoms secondary to fluid overload. No cough, shortness of breath today.    10 day course of vancomycin started on 05/17/18 after patient called to clinic with profuse diarrhea, laxatives were stopped. Since then she has noticed resolution of her diarrhea and is having 1-2 semi-formed stools daily.     Of note she had several staples removed on the L side of her Mercedes incision during hospitalization for superficial wound infection. Completed course of ciprofloxacin. She has had serous drainage from the site that has since decrease, no foul smell. Denies fevers or chills. with persistent serous discharge. Both patient and husband     Denies SOB/CP/abdominal pain.  but is currently having back pain and lower extremity swelling.      Objective  Vitals:    05/27/18 1250   BP: 112/72   BP Site: L Arm   BP Position: Sitting   Pulse: 76   Resp: 18   Temp: 37 ??C (98.6 ??F)   TempSrc: Tympanic   Weight: 49.2 kg (108 lb 6.4 oz)   Height: 149.9 cm (4' 11.02)      Body mass index is 21.88 kg/m??.     Physical Exam:    General Appearance:   Thin female, mild distress from pain, sitting in a wheelchair.   Lungs:                          Clear to auscultation bilaterally.  Heart:                           Regular rate and rhythm  Abdomen:                     Staples in place. No significant erythema, fluctuance. At left most site of staple line, she has mild ss fluid drainage. No significant erythema or fluctuance. Otherwise soft, minimally tender, non-distended  Extremities:                  trace pedal edema, resolving ecchymosis  ??    Data Review:  Lab Results   Component Value Date    WBC 5.4 05/26/2018    HGB 11.6 (L) 05/26/2018    HCT 33.4 (L) 05/26/2018    PLT 245 05/26/2018       Lab Results   Component Value Date    NA 135 05/26/2018    K 4.0 05/26/2018    CL 96 (L) 05/26/2018    CO2 27.0 05/26/2018    BUN 19 05/26/2018    CREATININE 0.82 05/26/2018    GLU 100 05/26/2018    CALCIUM 9.3 05/26/2018    MG 1.8 05/26/2018    PHOS 4.4 05/26/2018       Lab Results   Component Value Date    BILITOT 0.9 05/26/2018    BILIDIR 0.40 05/26/2018    PROT 6.9 05/26/2018    ALBUMIN 4.1 05/26/2018    ALT 28 05/26/2018    AST 16 05/26/2018    ALKPHOS 84 05/26/2018    GGT 63 (H) 05/26/2018       Lab Results   Component Value Date    LABPROT 16.2 (H) 03/17/2018    INR 1.09 05/02/2018    APTT 25.5 (L) 04/29/2018         Imaging:    EXAM: XR CERVICAL SPINE AP AND LATERAL  DATE: 05/17/2018 9:45 AM        Impression       Borderline widening of the anterior atlantodental interval and minimal grade 1 anterolisthesis at C2-3. Lateral flexion and extension views may be obtained for further evaluation if clinically indicated.    Advanced degenerative change of the cervical spine without focal osseous erosion.       XR Lumbar Spine 05/17/18  Impression       Moderate to advanced spondylosis of the lumbar spine.    Chronic compression deformities of the T12 and L2 vertebral bodies. No new or progressive compression deformity.     05/17/18  EXAM: XR CHEST 2 VIEWS        Impression       Stable appearing small right pleural effusion and right basilar atelectasis.  Stable appearing moderate to severe compression deformity in lower thoracic spine.     I attest that I have reviewed the student note and that the components of the history of the present illness, the physical exam, and the assessment and plan documented were performed by me or were performed in my presence by the student where I verified the documentation and performed (or re-performed) the exam and medical decision making.     Ammie Ferrier, MS4

## 2018-05-27 NOTE — Unmapped (Signed)
TRANSPLANT SURGERY PROGRESS NOTE    Assessment and Plan  Sara Sanders is a 62 y.o. 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 She had no surgical complications immediately post-operatively. She was started on vancomycin for C. Diff positive stool and     Tremors    UOP        RECOMMENDATIONS:  1. Post-OLT Care:  -CBC, BMP and LFTs today reviewed. Improved volume status and normonatremic.   -Tac 11.1 (mild supra-therapeutic). Goal trough 8-10.  -Changes to cellcept and prednisone below   -Continue Bactrim for PCP prophylaxis  -Continue Ursodiol in setting of intraoperative stent  -Continue cephalexin 500 mg QID to prevent incisional cellulitis   ??  2. RA Flare  -Completed visit with Rheumatology who recommended no changes to current regimen  ??  3. LE Edema  -Continue Lasix 40 mg PO daily. Encouraged to increased po intake.  ??  4. Back pain  -Lumbar fracture noted at Rheumatology visit. Repeat spinal XR today for further assessment.  ??        Subjective  Sara Sanders is a 62 y.o. 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 She had no surgical complications immediately post-operatively but is currently having back pain and lower extremity swelling    Improved PO intake and   Objective    Vitals:    05/27/18 1259   BP: 112/72   BP Site: L Arm   BP Position: Sitting   Pulse: 76   Resp: 18   Temp: 37 ??C (98.6 ??F)   TempSrc: Tympanic   Weight: 49.3 kg (108 lb 9.6 oz)   Height: 149.9 cm (4' 11.02)      Body mass index is 21.92 kg/m??.     Physical Exam:          Data Review:  All lab results last 24 hours:  No results found for this or any previous visit (from the past 24 hour(s)).      Imaging:    EXAM: XR CHEST 2 VIEWS  DATE: 05/17/2018 9:44 AM  ACCESSION: [id number] UN  DICTATED: 05/17/2018 10:01 AM  INTERPRETATION LOCATION: [facility]    CLINICAL INDICATION: 62 years old Female with LIVER TRANSPLANT ??- Z94.4 - Liver replaced by transplant (CMS - [facility]) - J90 - Pleural effusion ??    COMPARISON: 05/13/2018    TECHNIQUE: PA and Lateral Chest Radiographs.    FINDINGS:     Stable appearing small right pleural effusion and right basilar atelectasis. No new consolidation.    No pneumothorax.    Unremarkable cardiomediastinal silhouette.     Stable appearing moderate to severe compression deformity in lower thoracic spine.      Impression       Stable appearing small right pleural effusion and right basilar atelectasis.    Stable appearing moderate to severe compression deformity in lower thoracic spine.     EXAM: XR LUMBAR SPINE 2 OR 3 VIEWS  DATE: 05/17/2018 9:45 AM  ACCESSION: [id number] UN  DICTATED: 05/17/2018 9:56 AM  INTERPRETATION LOCATION: [facility]    CLINICAL INDICATION: 62 years old Female with lower back pain, hx of RA ??- M54.5 - Low back pain, unspecified back pain laterality, unspecified chronicity, with sciatica presence unspecified - M54.2 -  Neck pain ??    COMPARISON: Interpretation of outside film 03/09/2018. MRI abdomen with and without contrast 12/08/2017    TECHNIQUE: AP, lateral, Ferguson views of the lumbar spine.    FINDINGS:   Diffuse osteopenia. Chronic T12 severe anterior wedge compression fracture with focal kyphosis, unchanged. Old L2 mild superior endplate compression fracture is also without significant interval progression. Grade 1 retrolisthesis L2-L3, grade 1 anterolisthesis of L4-L5 and L5-S1. No new or progressive compression deformity.    Multilevel degenerative changes consists of endplate osteophytes, and intervertebral disc space narrowing most prominent and moderate to severe at L2-3 and L3-4. Advanced facet arthrosis is noted in the lower lumbar spine..    Sacroiliac joints and pubic symphysis are approximated.    Sequela of liver transplantation with multiple surgical clips overlying the abdomen. Abdominal aortic calcifications.      Impression       Moderate to advanced spondylosis of the lumbar spine.    Chronic compression deformities of the T12 and L2 vertebral bodies. No new or progressive compression deformity.       EXAM: XR CERVICAL SPINE AP AND LATERAL  DATE: 05/17/2018 9:45 AM  ACCESSION: [id number] UN  DICTATED: 05/17/2018 10:03 AM  INTERPRETATION LOCATION: [facility]    CLINICAL INDICATION: 62 years old Female with neck pain, hx of RA ??- M54.5 - Low back pain, unspecified back pain laterality, unspecified chronicity, with sciatica presence unspecified - M54.2 - Neck pain ??    COMPARISON: Today's chest radiograph.    TECHNIQUE: AP and lateral views of the cervical spine.    FINDINGS:     Reversal of normal cervical lordosis with minimal anterolisthesis at C2-3 and minimal retrolisthesis at C4-5. The anterior atlantodental interval measures 3 mm, borderline widened no focal osseous erosion.. Multilevel degenerative changes of the cervical spine consists of endplate osteophytosis, intervertebral disc space narrowing, subchondral sclerosis, geode, and facet arthropathy. Severe intervertebral disc space narrowing C3-C4, C4-C5, C5-6, and C6-C7. Vertebral body heights are maintained No prevertebral soft tissue swelling.          Impression       Borderline widening of the anterior atlantodental interval and minimal grade 1 anterolisthesis at C2-3. Lateral flexion and extension views may be obtained for further evaluation if clinically indicated.    Advanced degenerative change of the cervical spine without focal osseous erosion.       Ammie Ferrier, M4

## 2018-05-27 NOTE — Unmapped (Signed)
[facility] PHARMACY NOTE  05/27/2018   [name]  [id number]    Medication changes today:   1. DECREASE: tacro to 7mg  PO twice daily  2. DECREASE: myfortic to 360mg  PO twice daily  3. DISCONTINUE lasix  4. Decrease magnesium oxide 400mg  to 1 tab PO once daily  5. Added vancomycin 125mg  four times daily to pt's med list    Education/Adherence tools provided today:  1.provided additional education on immunosuppression and transplant related medications including reviewing indications of medications, dosing and side effects     Follow up items:  1. goal of understanding indications and dosing of immunosuppression medications   2. Follow up with plan for back fracture (see below)  3. Monitor RA progress  4. Check vitamin D level at next visit  5. Monitor renal function    Next visit with pharmacy in 1-2 weeks  ____________________________________________________________________    Sara Sanders is a 62 y.o. 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     Other PMH significant for ascites, esophageal varices, hypothyroidism, RA, HTN  Seen by pharmacy today for: medication management; last seen by pharmacy 1 months ago    CC: Patient had no complaints, reported 2 new back fracture discovered last week    Vitals:    05/27/18 1259   BP: 112/72   Pulse: 76   Resp: 18   Temp: 37 ??C (98.6 ??F)       No Known Allergies    All medications reviewed and updated.     Medication list includes revisions made during today???s encounter    Outpatient Encounter Medications as of 05/27/2018   Medication Sig Dispense Refill   ??? acetaminophen (TYLENOL) 325 MG tablet TAKE 2 TABLETS BY MOUTH EVERY 4 HOURS AS NEEDED FOR PAIN. MAX 9 TABLETS (3000 MG) PER DAY. 100 each 2   ??? aspirin (ECOTRIN) 81 MG tablet TAKE 1 TABLET BY MOUTH ONCE DAILY 120 tablet 11   ??? calcium carbonate-vitamin D3 600 mg(1,500mg ) -200 unit per tablet Take 1 tablet by mouth daily.      ??? golimumab (SIMPONI) 50 mg/0.5 mL PnIj pen injector HOLD  0   ??? levothyroxine (SYNTHROID) 75 MCG tablet Take 1 tablet (75 mcg total) by mouth daily. 30 tablet 11   ??? magnesium oxide (MAG-OX) 400 mg (241.3 mg magnesium) tablet Take 1 tablet (400 mg total) by mouth daily. 60 tablet 11   ??? mycophenolate (MYFORTIC) 180 MG EC tablet Take 2 tablets (360 mg total) by mouth Two (2) times a day. 180 tablet 11   ??? omeprazole (PRILOSEC) 40 MG capsule TAKE 1 CAPSULE BY MOUTH TWICE DAILY 60 capsule 0   ??? polyethylene glycol (GLYCOLAX) 17 gram/dose powder MIX 17 GRAMS (USE MEASURE LINE IN CAP) IN 4-8 OUNCES OF WATER, JUICE, SODA, COFFEE, OR TEA AND DRINK ONCE DAILY AS NEEDED 510 g 0   ??? predniSONE (DELTASONE) 10 MG tablet TAKE 2 TABLETS (20 MG) BY MOUTH DAILY 60 tablet 1   ??? PROGRAF 1 mg capsule Take 7 capsules (7 mg total) by mouth two (2) times a day. 480 capsule 11   ??? sulfamethoxazole-trimethoprim (BACTRIM,SEPTRA) 400-80 mg per tablet TAKE 1 TABLET BY MOUTH 3 TIMES WEEKLY 12 each 5   ??? traMADol (ULTRAM) 50 mg tablet TAKE 1 TABLET BY MOUTH TWICE DAILY AS NEEDED FOR PAIN 60 tablet 1   ??? ursodiol (ACTIGALL) 300 mg capsule Take 1 capsule (300 mg total) by mouth Two (2)  times a day. 60 each 11   ??? valGANciclovir (VALCYTE) 450 mg tablet TAKE 1 TABLET BY MOUTH ONCE DAILY 30 tablet 2   ??? vancomycin (VANCOCIN) 125 MG capsule Take 1 capsule (125 mg total) by mouth Four (4) times a day. for 10 days 40 capsule 0   ??? [DISCONTINUED] furosemide (LASIX) 40 MG tablet TAKE 1 TABLET BY MOUTH DAILY 30 tablet 1   ??? [DISCONTINUED] gabapentin (NEURONTIN) 100 MG capsule Take 1 capsule (100 mg total) by mouth Three (3) times a day. 90 capsule 0   ??? [DISCONTINUED] magnesium oxide (MAG-OX) 400 mg (241.3 mg magnesium) tablet TAKE 1 TABLET BY MOUTH TWICE DAILY 60 tablet 11   ??? [DISCONTINUED] mycophenolate (MYFORTIC) 180 MG EC tablet Take 3 tablets (540 mg total) by mouth Two (2) times a day. 180 tablet 11   ??? [DISCONTINUED] PROGRAF 1 mg capsule Take 8 capsules (8 mg total) by mouth two (2) times a day. 480 capsule 11   ??? [DISCONTINUED] traMADol (ULTRAM) 50 mg tablet Take 1 tablet (50 mg total) by mouth every four (4) hours as needed for pain. 180 tablet 0     No facility-administered encounter medications on file as of 05/27/2018.      Induction agent : steroids only    CURRENT IMMUNOSUPPRESSION: tacrolimus 8 mg PO BID  prograf/cyclosporine goal: 8-10   myfortic540  mg PO bid    prednisone 20mg  once daily  - Patient's PMH includes rheumatoid arthritis and was previously taking golimumab. Medication was stopped post-transplant. Patient experienced significant RA flare up and steroid and myfortic dose was increased to help treat flare. However, in the setting of C. Diff infection and controlled RA flare, we think it's appropriate to decrease patient's myfortic dose to protocol dose to prevent over-suppression. Additionally, will decrease patient's tacro dose due to C.diff related diarrhea. Patient will require frequent tacro monitoring as she finishes the last 4 days of her C.Diff treatment.     Patient is tolerating immunosuppression well    IMMUNOSUPPRESSION DRUG LEVELS:  Lab Results   Component Value Date    TACROLIMUS 11.1 05/26/2018    TACROLIMUS 8.5 05/24/2018    TACROLIMUS 4.2 05/21/2018     No results found for: CYCLO  No results found for: EVEROLIMUS  No results found for: SIROLIMUS    Patient did not get level drawn today.      Graft function: improving   DSA: ntd  Biopsies to date:   ?? 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 Will decrease myfortic to 360mg  twice daily. Will continue current prednisone dose. Will monitor closely.    ID prophylaxis:   CMV Status: D-/ R+, moderate risk. CMV prophylaxis: valganciclovir 450 mg daily x 3 months per protocol.  No results found for: CMVCP  PCP Prophylaxis: bactrim SS 1 tab MWF x 6 months.  Thrush:  completed in hospital  Patient is  tolerating infectious prophylaxis well. Patient was started on vancomycin 125mg  PO four times daily x 10 days for C. Diff infection on 05/21/2018.   Plan: Continue per protocol. Continue to monitor.    CV Prophylaxis: asa 81 mg   The 10-year ASCVD risk score Denman George DC Jr., et al., 2013) is: 3.7%  Statin therapy: Not indicated  Plan: Continue to monitor  BP: Goal < 140/90. Clinic vitals reported above  Home BP ranges: 120-150/70-90  Current meds include: furosemide 40mg  once daily (mostly for lower extremity swelling)  - Patient reported that LE swelling has been completely gone for almost a week now. Although patient's home BP log indicates that her BP is at goal, her BP at the clinic are running on the lower end (108/48 on 8/1). Since patient was found to have 2 new back fractures, we think it's appropriate to dc furosemide to prevent syncope.   Plan: Discontinue furosemide. Monitor Scr and swelling. Continue to monitor    Anemia:  H/H:   Lab Results   Component Value Date    HGB 11.6 (L) 05/26/2018     Lab Results   Component Value Date    HCT 33.4 (L) 05/26/2018     Iron panel:  Lab Results   Component Value Date    IRON 82 12/08/2017    TIBC  12/08/2017      Comment:      Unable to calculate.      FERRITIN 539.0 (H) 12/08/2017     Lab Results   Component Value Date    LABIRON  12/08/2017      Comment:      Unable to calculate.       Prior ESA use: none  Plan: Continue to monitor.     DM:   Lab Results   Component Value Date    A1C 4.2 (L) 04/27/2018   . Goal A1c < 7  History of Dm? No  Established with endocrinologist/PCP for BG managment? No  Hypoglycemia: no  Plan:  Continue to monitor.    Electrolytes: wnl (lab from 05/26/2018)  Meds currently on: magnesium oxide 400mg  twice daily  Plan: Decrease magnesium oxide to 400mg  once daily. Continue to monitor GI/BM: Pt reports 1-2BM (had frequent diarrheal episodes when initially diagnosed with c.diff, BM has came back down to baseline now)  Meds currently on: miralax - not taking  Plan: remove miralax from med list. Continue to monitor    Pain: pt reports moderate pain  Meds currently on: acetaminophen 650mg  Q8H PRN alternating with tramadol 50mg  Q4H PRN- this regimen is per RA provider to help with joint pain since pt can not take golimumab at this time. Patient reported needing both medications daily because of her back pain(see below). She was able to cut down her tramadol dose to 1 tab 1-2 times a day as needed . She is still using tylenol regularly.   Plan: Encouraged using acetaminophen for mild to moderate pain, no more than 3000mg  daily. Continue to monitor    Bone health:   Vitamin D Level: none available. Goal > 30.   Last DEXA results:  none available  Current meds include: calcium carb-vitamin D3 600mg -200 units/tab: 1 tab once daily  - Patient was found to have 2 new back fractures last week and will follow up with a lumbar specialist for that  Plan: Vitamin D level  needs to be drawn with next lab schedule,continue supplementation. Continue to monitor.     Women's/Men's Health:  JESSEY HUYETT is a 62 y.o. Female perimenopausal. Patient reports no men's/women's health issues  Plan: Continue to monitor    Other: RA- defer to local rheumatology for treatment plan. Pt has been on golimumab, but d/t increased risk of infection at this time, we will not be restarting it.  Flares were initially being managed by increased prednisone and myfortic + tramadol  and tylenol post-transplant. Myfortic dose decreased in the setting of C. Diff infection at this visit.    Will need to reassess initiating golimumab around 3 months post txp    Adherence: Patient has average understanding of medications; was able to independently identify names/doses of immunosuppressants and OI meds.  Patient  does fill their own pill box on a regular basis at home. - husband fills med box and is primary caretaker of medications  Patient brought medication card:yes  Pill box:did not bring  Plan: provided basic adherence counseling/intervention    Spent approximately 40 minutes on educating this patient and greater than 50% was spent in direct face to face counseling regarding post transplant medication education. Questions and concerns were address to patient's satisfaction.    Patient was reviewed with Dr. Matilde Haymaker who was agreement with the stated plan:     During this visit, the following was completed:   BP log data assessment  Labs ordered and evaluated  complex treatment plan >1 DS   Patient education was completed for 25-60 minutes     All questions/concerns were addressed to the patient's satisfaction.  __________________________________________  Lucy Chris, PharmD  PGY-1 Pharmacy Resident  PAGER [phone]    PATIENT SEEN AND EVALUATED BY:  Noah Charon, PHARMD, CPP  [employer] TRANSPLANT  PAGER [phone]

## 2018-05-28 ENCOUNTER — Ambulatory Visit: Admit: 2018-05-28 | Discharge: 2018-05-29 | Payer: PRIVATE HEALTH INSURANCE

## 2018-05-28 DIAGNOSIS — E612 Magnesium deficiency: Secondary | ICD-10-CM

## 2018-05-28 DIAGNOSIS — Z114 Encounter for screening for human immunodeficiency virus [HIV]: Secondary | ICD-10-CM

## 2018-05-28 DIAGNOSIS — Z5181 Encounter for therapeutic drug level monitoring: Secondary | ICD-10-CM

## 2018-05-28 DIAGNOSIS — Z1159 Encounter for screening for other viral diseases: Secondary | ICD-10-CM

## 2018-05-28 DIAGNOSIS — Z944 Liver transplant status: Principal | ICD-10-CM

## 2018-05-28 LAB — COMPREHENSIVE METABOLIC PANEL
ALKALINE PHOSPHATASE: 80 U/L (ref 38–126)
ANION GAP: 14 mmol/L (ref 9–15)
AST (SGOT): 20 U/L (ref 14–38)
BILIRUBIN TOTAL: 0.9 mg/dL (ref 0.0–1.2)
BLOOD UREA NITROGEN: 25 mg/dL — ABNORMAL HIGH (ref 7–21)
BUN / CREAT RATIO: 22
CALCIUM: 9.2 mg/dL (ref 8.5–10.2)
CHLORIDE: 96 mmol/L — ABNORMAL LOW (ref 98–107)
CO2: 28 mmol/L (ref 22.0–30.0)
EGFR CKD-EPI AA FEMALE: 61 mL/min/{1.73_m2} (ref >=60)
EGFR CKD-EPI NON-AA FEMALE: 53 mL/min/{1.73_m2} — ABNORMAL LOW (ref >=60)
GLUCOSE RANDOM: 73 mg/dL (ref 65–99)
POTASSIUM: 3.9 mmol/L (ref 3.5–5.0)
PROTEIN TOTAL: 7.1 g/dL (ref 6.5–8.3)
SODIUM: 138 mmol/L (ref 135–145)

## 2018-05-28 LAB — CBC W/ AUTO DIFF
BASOPHILS ABSOLUTE COUNT: 0 10*9/L (ref 0.0–0.1)
BASOPHILS RELATIVE PERCENT: 0.7 %
EOSINOPHILS ABSOLUTE COUNT: 0.2 10*9/L (ref 0.0–0.4)
HEMATOCRIT: 36.1 % (ref 36.0–46.0)
HEMOGLOBIN: 12.2 g/dL — ABNORMAL LOW (ref 13.5–16.0)
LARGE UNSTAINED CELLS: 1 % (ref 0–4)
LYMPHOCYTES ABSOLUTE COUNT: 0.8 10*9/L — ABNORMAL LOW (ref 1.5–5.0)
LYMPHOCYTES RELATIVE PERCENT: 15.7 %
MEAN CORPUSCULAR HEMOGLOBIN: 33.7 pg (ref 26.0–34.0)
MEAN CORPUSCULAR VOLUME: 99.4 fL (ref 80.0–100.0)
MEAN PLATELET VOLUME: 6.9 fL — ABNORMAL LOW (ref 7.0–10.0)
MONOCYTES ABSOLUTE COUNT: 0.2 10*9/L (ref 0.2–0.8)
MONOCYTES RELATIVE PERCENT: 3.4 %
NEUTROPHILS ABSOLUTE COUNT: 3.9 10*9/L (ref 2.0–7.5)
NEUTROPHILS RELATIVE PERCENT: 74.6 %
PLATELET COUNT: 212 10*9/L (ref 150–440)
RED BLOOD CELL COUNT: 3.63 10*12/L — ABNORMAL LOW (ref 4.00–5.20)
RED CELL DISTRIBUTION WIDTH: 19 % — ABNORMAL HIGH (ref 12.0–15.0)

## 2018-05-28 LAB — TACROLIMUS, TROUGH: Lab: 9.3

## 2018-05-28 LAB — MAGNESIUM: Magnesium:MCnc:Pt:Ser/Plas:Qn:: 1.7

## 2018-05-28 LAB — TACROLIMUS LEVEL, TROUGH: TACROLIMUS, TROUGH: 9.3 ng/mL (ref 5.0–15.0)

## 2018-05-28 LAB — PHOSPHORUS: Phosphate:MCnc:Pt:Ser/Plas:Qn:: 4.9 — ABNORMAL HIGH

## 2018-05-28 LAB — EOSINOPHILS ABSOLUTE COUNT: Lab: 0.2

## 2018-05-28 LAB — HIV ANTIGEN/ANTIBODY COMBO: HIV 1+2 Ab+HIV1 p24 Ag:PrThr:Pt:Ser/Plas:Ord:IA: NONREACTIVE

## 2018-05-28 LAB — BILIRUBIN DIRECT: Bilirubin.glucuronidated:MCnc:Pt:Ser/Plas:Qn:: 0.4

## 2018-05-28 LAB — GAMMA GLUTAMYL TRANSFERASE: Gamma glutamyl transferase:CCnc:Pt:Ser/Plas:Qn:: 60 — ABNORMAL HIGH

## 2018-05-28 LAB — CALCIUM: Calcium:MCnc:Pt:Ser/Plas:Qn:: 9.2

## 2018-05-28 LAB — HEPATITIS B SURFACE ANTIGEN: Hepatitis B virus surface Ag:PrThr:Pt:Ser:Ord:: NONREACTIVE

## 2018-05-28 MED FILL — VALGANCICLOVIR 450 MG TABLET: 30 days supply | Qty: 30 | Fill #0 | Status: AC

## 2018-05-28 MED FILL — URSODIOL 300 MG CAPSULE: 30 days supply | Qty: 60 | Fill #0 | Status: AC

## 2018-05-28 MED FILL — TRAMADOL 50 MG TABLET: 30 days supply | Qty: 60 | Fill #0 | Status: AC

## 2018-05-28 MED FILL — TRAMADOL 50 MG TABLET: ORAL | 30 days supply | Qty: 60 | Fill #0

## 2018-05-28 MED FILL — ACETAMINOPHEN 325 MG TABLET: 30 days supply | Qty: 100 | Fill #0 | Status: AC

## 2018-05-28 MED FILL — SULFAMETHOXAZOLE 400 MG-TRIMETHOPRIM 80 MG TABLET: 28 days supply | Qty: 12 | Fill #0 | Status: AC

## 2018-05-28 NOTE — Unmapped (Addendum)
Patient's creatinine elevated with today's results, perhaps d/t higher tac level from Wednesday. Dose reduced yesterday at her clinic appt, and today's level within goal. Spoke with patient, who said she hydrated pretty well yesterday, but not as well as usual, since she was at clinic, where she said she usually drinks about a gallon of water daily. She agreed to maintain that rate of hydration.Will monitor cr and tac level with labs 3x weekly.

## 2018-05-28 NOTE — Unmapped (Signed)
Patient seen in clinic today, accompanied by her husband. She reports she is feeling much better and using much less pain medication (tramadol/tylenol) for her vertebral fx, sometimes only once daily for each. She also reports her diarrhea has become less frequent, of smaller qty, and less urgent. She states she is drinking about 3-4 qts of fluid daily and is eating more frequently/higher qty as well. Swelling to her LE is gone. Her wound site is draining from left wing a small amt daily, non purulent and w.o.odor. Wound is approximated except for the~1cm draining area and is w.o.erythema. Spent about 10 minutes educating patient/husband on post txp care. Patient very emotional at times, verbalizing persistent fear of dying and wanting to return to normal life. Reassured her that she has made great progress and is improving daily. Validated that txp is a very stressful experience, and she may need therapy/medicine to help her feel better, but to focus on all her improvements. Dr.Serrano inspected wound and was w.o.concern for infection. He recommended her tac dose be reduced to 7mg  bid, stating that she should continue a 3x weekly lab interval until her tac level stabilized. Will see patient again in 2 wks for staple removal.

## 2018-05-31 ENCOUNTER — Ambulatory Visit: Admit: 2018-05-31 | Discharge: 2018-06-01 | Payer: PRIVATE HEALTH INSURANCE

## 2018-05-31 DIAGNOSIS — Z79899 Other long term (current) drug therapy: Secondary | ICD-10-CM

## 2018-05-31 DIAGNOSIS — Z944 Liver transplant status: Principal | ICD-10-CM

## 2018-05-31 LAB — CBC W/ AUTO DIFF
BASOPHILS ABSOLUTE COUNT: 0 10*9/L (ref 0.0–0.1)
BASOPHILS RELATIVE PERCENT: 0.9 %
EOSINOPHILS ABSOLUTE COUNT: 0.1 10*9/L (ref 0.0–0.4)
EOSINOPHILS RELATIVE PERCENT: 2.1 %
HEMATOCRIT: 36.6 % (ref 36.0–46.0)
HEMOGLOBIN: 12.4 g/dL — ABNORMAL LOW (ref 13.5–16.0)
LARGE UNSTAINED CELLS: 1 % (ref 0–4)
LYMPHOCYTES RELATIVE PERCENT: 17.2 %
MEAN CORPUSCULAR HEMOGLOBIN: 33.5 pg (ref 26.0–34.0)
MEAN CORPUSCULAR VOLUME: 99.3 fL (ref 80.0–100.0)
MEAN PLATELET VOLUME: 7.4 fL (ref 7.0–10.0)
MONOCYTES ABSOLUTE COUNT: 0.2 10*9/L (ref 0.2–0.8)
MONOCYTES RELATIVE PERCENT: 3.2 %
NEUTROPHILS ABSOLUTE COUNT: 3.6 10*9/L (ref 2.0–7.5)
NEUTROPHILS RELATIVE PERCENT: 75.9 %
PLATELET COUNT: 153 10*9/L (ref 150–440)
RED BLOOD CELL COUNT: 3.68 10*12/L — ABNORMAL LOW (ref 4.00–5.20)
RED CELL DISTRIBUTION WIDTH: 18.5 % — ABNORMAL HIGH (ref 12.0–15.0)
WBC ADJUSTED: 4.7 10*9/L (ref 4.5–11.0)

## 2018-05-31 LAB — COMPREHENSIVE METABOLIC PANEL
ALBUMIN: 4.5 g/dL (ref 3.5–5.0)
ALKALINE PHOSPHATASE: 72 U/L (ref 38–126)
ANION GAP: 13 mmol/L (ref 9–15)
AST (SGOT): 20 U/L (ref 14–38)
BILIRUBIN TOTAL: 1 mg/dL (ref 0.0–1.2)
BLOOD UREA NITROGEN: 25 mg/dL — ABNORMAL HIGH (ref 7–21)
BUN / CREAT RATIO: 25
CALCIUM: 9.8 mg/dL (ref 8.5–10.2)
CHLORIDE: 96 mmol/L — ABNORMAL LOW (ref 98–107)
CO2: 30 mmol/L (ref 22.0–30.0)
CREATININE: 1.01 mg/dL — ABNORMAL HIGH (ref 0.60–1.00)
EGFR CKD-EPI AA FEMALE: 70 mL/min/{1.73_m2} (ref >=60)
EGFR CKD-EPI NON-AA FEMALE: 61 mL/min/{1.73_m2} (ref >=60)
GLUCOSE RANDOM: 77 mg/dL (ref 65–179)
POTASSIUM: 5.1 mmol/L — ABNORMAL HIGH (ref 3.5–5.0)
PROTEIN TOTAL: 7.2 g/dL (ref 6.5–8.3)
SODIUM: 139 mmol/L (ref 135–145)

## 2018-05-31 LAB — MAGNESIUM: Magnesium:MCnc:Pt:Ser/Plas:Qn:: 1.7

## 2018-05-31 LAB — PROTEIN TOTAL: Protein:MCnc:Pt:Ser/Plas:Qn:: 7.2

## 2018-05-31 LAB — PHOSPHORUS: Phosphate:MCnc:Pt:Ser/Plas:Qn:: 5.3 — ABNORMAL HIGH

## 2018-05-31 LAB — TACROLIMUS BLOOD: Lab: 9

## 2018-05-31 LAB — GAMMA GLUTAMYL TRANSFERASE: Gamma glutamyl transferase:CCnc:Pt:Ser/Plas:Qn:: 51 — ABNORMAL HIGH

## 2018-05-31 LAB — BILIRUBIN DIRECT: Bilirubin.glucuronidated:MCnc:Pt:Ser/Plas:Qn:: 0.3

## 2018-05-31 LAB — RED CELL DISTRIBUTION WIDTH: Lab: 18.5 — ABNORMAL HIGH

## 2018-05-31 NOTE — Unmapped (Signed)
Patient's K elevated with today's results. Spoke with her and reviewed high K foods in her diet to limit. She verbalized understanding and agreed to increase her water intake, since she had slacked on the recommended intake over the weekend, as well.

## 2018-06-02 ENCOUNTER — Ambulatory Visit: Admit: 2018-06-02 | Discharge: 2018-06-03 | Payer: PRIVATE HEALTH INSURANCE

## 2018-06-02 DIAGNOSIS — Z79899 Other long term (current) drug therapy: Secondary | ICD-10-CM

## 2018-06-02 DIAGNOSIS — Z944 Liver transplant status: Principal | ICD-10-CM

## 2018-06-02 DIAGNOSIS — M549 Dorsalgia, unspecified: Secondary | ICD-10-CM

## 2018-06-02 DIAGNOSIS — S32020A Wedge compression fracture of second lumbar vertebra, initial encounter for closed fracture: Secondary | ICD-10-CM

## 2018-06-02 DIAGNOSIS — Z8739 Personal history of other diseases of the musculoskeletal system and connective tissue: Principal | ICD-10-CM

## 2018-06-02 DIAGNOSIS — S22000A Wedge compression fracture of unspecified thoracic vertebra, initial encounter for closed fracture: Secondary | ICD-10-CM

## 2018-06-02 LAB — CBC W/ AUTO DIFF
BASOPHILS ABSOLUTE COUNT: 0.1 10*9/L (ref 0.0–0.1)
BASOPHILS RELATIVE PERCENT: 1.1 %
EOSINOPHILS ABSOLUTE COUNT: 0.2 10*9/L (ref 0.0–0.4)
EOSINOPHILS RELATIVE PERCENT: 3.9 %
HEMATOCRIT: 34.1 % — ABNORMAL LOW (ref 36.0–46.0)
HEMOGLOBIN: 11.9 g/dL — ABNORMAL LOW (ref 13.5–16.0)
LARGE UNSTAINED CELLS: 2 % (ref 0–4)
LYMPHOCYTES ABSOLUTE COUNT: 0.9 10*9/L — ABNORMAL LOW (ref 1.5–5.0)
LYMPHOCYTES RELATIVE PERCENT: 20.4 %
MEAN CORPUSCULAR HEMOGLOBIN CONC: 34.7 g/dL (ref 31.0–37.0)
MEAN CORPUSCULAR HEMOGLOBIN: 34.2 pg — ABNORMAL HIGH (ref 26.0–34.0)
MEAN CORPUSCULAR VOLUME: 98.4 fL (ref 80.0–100.0)
MEAN PLATELET VOLUME: 7.2 fL (ref 7.0–10.0)
MONOCYTES ABSOLUTE COUNT: 0.1 10*9/L — ABNORMAL LOW (ref 0.2–0.8)
MONOCYTES RELATIVE PERCENT: 2 %
NEUTROPHILS ABSOLUTE COUNT: 3.1 10*9/L (ref 2.0–7.5)
NEUTROPHILS RELATIVE PERCENT: 71.1 %
PLATELET COUNT: 149 10*9/L — ABNORMAL LOW (ref 150–440)
RED CELL DISTRIBUTION WIDTH: 18.9 % — ABNORMAL HIGH (ref 12.0–15.0)
WBC ADJUSTED: 4.4 10*9/L — ABNORMAL LOW (ref 4.5–11.0)

## 2018-06-02 LAB — COMPREHENSIVE METABOLIC PANEL
ALKALINE PHOSPHATASE: 69 U/L (ref 38–126)
ALT (SGPT): 44 U/L (ref 15–48)
ANION GAP: 14 mmol/L (ref 9–15)
AST (SGOT): 17 U/L (ref 14–38)
BILIRUBIN TOTAL: 0.9 mg/dL (ref 0.0–1.2)
BLOOD UREA NITROGEN: 26 mg/dL — ABNORMAL HIGH (ref 7–21)
BUN / CREAT RATIO: 24
CALCIUM: 9.4 mg/dL (ref 8.5–10.2)
CHLORIDE: 98 mmol/L (ref 98–107)
CO2: 27 mmol/L (ref 22.0–30.0)
CREATININE: 1.1 mg/dL — ABNORMAL HIGH (ref 0.60–1.00)
EGFR CKD-EPI AA FEMALE: 63 mL/min/{1.73_m2} (ref >=60)
EGFR CKD-EPI NON-AA FEMALE: 55 mL/min/{1.73_m2} — ABNORMAL LOW (ref >=60)
GLUCOSE RANDOM: 72 mg/dL (ref 65–179)
PROTEIN TOTAL: 6.9 g/dL (ref 6.5–8.3)
SODIUM: 139 mmol/L (ref 135–145)

## 2018-06-02 LAB — PHOSPHORUS: Phosphate:MCnc:Pt:Ser/Plas:Qn:: 5.5 — ABNORMAL HIGH

## 2018-06-02 LAB — GAMMA GLUTAMYL TRANSFERASE: Gamma glutamyl transferase:CCnc:Pt:Ser/Plas:Qn:: 45

## 2018-06-02 LAB — HEPATITIS C RNA, QUANTITATIVE, PCR

## 2018-06-02 LAB — MEAN CORPUSCULAR VOLUME: Lab: 98.4

## 2018-06-02 LAB — HCV RNA LOG10: Lab: 0

## 2018-06-02 LAB — MAGNESIUM: Magnesium:MCnc:Pt:Ser/Plas:Qn:: 1.7

## 2018-06-02 LAB — EGFR CKD-EPI AA FEMALE: Lab: 63

## 2018-06-02 LAB — BILIRUBIN DIRECT: Bilirubin.glucuronidated:MCnc:Pt:Ser/Plas:Qn:: 0.3

## 2018-06-02 MED FILL — PREDNISONE 10 MG TABLET: 30 days supply | Qty: 60 | Fill #0 | Status: AC

## 2018-06-02 NOTE — Unmapped (Signed)
Recommend continuing tramadol and Tylenol as needed for back pain.  If you have persistent back pain in 6 to 8 weeks time I would recommend a follow-up.  You can send me a mychart message with an update.

## 2018-06-02 NOTE — Unmapped (Signed)
[facility] New Patient Evaluation    Patient Name:Sara Sanders  MRN: [id number]  DOB: December 14, 1955  Age: 63 y.o.   Date: 06/02/2018  Attending Physician: Tula Nakayama, ANP    ASSESSMENT:     Pt is a 62 y.o. year old female with thoracic and lumbar pain with chronic T12, L2 compression fractures    PLAN:     Recommend continuing tramadol and Tylenol as needed for pain.  Her imaging seems to demonstrate chronic in appearance compression fractures with acute worsening of pain mostly musculoskeletal in my opinion.  I do not see a role for bracing.  If she continues to experience significant back pain after 6 to 8 weeks time I would recommend a follow-up with repeat imaging in the form of upright x-rays to evaluate for development of focal kyphosis.  Although given her complex medical history and recent liver transplant she would not likely be a surgical candidate.  I have asked her to contact me via my chart with an update.    SUBJECTIVE:     Chief complaint: Back Pain      History of present Illness:    Pt is a 62 y.o. year old female seen as a new evaluation for Back pain.  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  She reports that following her surgery she has been experiencing some significant pain in her mid to low back area.  She feels as though it came on all of a sudden.  Her pain seems to be worse when she is standing and walking and better when she is lying in a reclined position but not necessarily when she is sitting.  She has been taking tramadol and Tylenol for pain and seems to be helpful.  She is also been utilizing heat which seems to help.  She was in to see her rheumatologist a few weeks ago and at that time x-rays were obtained that demonstrated some spine fractures and a few weeks after that she saw her transplant surgeon and had lumbar spine upright x-rays obtained which did show chronic in appearance T12 and L2 compression fractures.  She was referred here for further evaluation of her fractures and management.  Denies focal weakness into her lower extremities, radicular pain into her extremities, bowel or bladder dysfunction, or saddle anesthesia.    We reviewed extensive past medical records today.     This visit does not involve Sara Sanders???s Compensation.    Current Outpatient Medications   Medication Sig Dispense Refill   ??? acetaminophen (TYLENOL) 325 MG tablet TAKE 2 TABLETS BY MOUTH EVERY 4 HOURS AS NEEDED FOR PAIN. MAX 9 TABLETS (3000 MG) PER DAY. 100 each 2   ??? aspirin (ECOTRIN) 81 MG tablet TAKE 1 TABLET BY MOUTH ONCE DAILY 120 tablet 11   ??? calcium carbonate-vitamin D3 600 mg(1,500mg ) -200 unit per tablet Take 1 tablet by mouth daily.      ??? levothyroxine (SYNTHROID) 75 MCG tablet Take 1 tablet (75 mcg total) by mouth daily. 30 tablet 11   ??? magnesium oxide (MAG-OX) 400 mg (241.3 mg magnesium) tablet Take 1 tablet (400 mg total) by mouth daily. 60 tablet 11   ??? mycophenolate (MYFORTIC) 180 MG EC tablet Take 2 tablets (360 mg total) by mouth Two (2) times a day. 180 tablet 11   ??? omeprazole (  PRILOSEC) 40 MG capsule TAKE 1 CAPSULE BY MOUTH TWICE DAILY 60 capsule 0   ??? predniSONE (DELTASONE) 10 MG tablet TAKE 2 TABLETS (20 MG) BY MOUTH DAILY 60 tablet 1   ??? PROGRAF 1 mg capsule Take 7 capsules (7 mg total) by mouth two (2) times a day. 480 capsule 11   ??? sulfamethoxazole-trimethoprim (BACTRIM,SEPTRA) 400-80 mg per tablet TAKE 1 TABLET BY MOUTH 3 TIMES WEEKLY 12 each 5   ??? traMADol (ULTRAM) 50 mg tablet TAKE 1 TABLET BY MOUTH TWICE DAILY AS NEEDED FOR PAIN 60 tablet 1   ??? ursodiol (ACTIGALL) 300 mg capsule Take 1 capsule (300 mg total) by mouth Two (2) times a day. 60 each 11   ??? valGANciclovir (VALCYTE) 450 mg tablet TAKE 1 TABLET BY MOUTH ONCE DAILY 30 tablet 2   ??? golimumab (SIMPONI) 50 mg/0.5 mL PnIj pen injector HOLD (Patient not taking: Reported on 06/02/2018)  0     No current facility-administered medications for this visit.        Allergies:   Patient has no known allergies.    Past Medical / Surgical History:   Past Medical History:   Diagnosis Date   ??? Apnea, sleep    ??? Arthritis     rheumatoid   ??? Cirrhosis (CMS-HCC)    ??? Disease of thyroid gland    ??? GERD (gastroesophageal reflux disease)    ??? Hepatitis, autoimmune (CMS-HCC)    ??? History of transfusion    ??? Iron deficiency    ??? Pneumonia    ??? Urinary incontinence        Past Surgical History:   Procedure Laterality Date   ??? BLADDER SUSPENSION  1993   ??? CHOLESTEATOMA EXCISION Left     EAR   ??? ear surgeries Bilateral     various   ??? HYSTERECTOMY     ??? PR TRANSPLANT LIVER,ALLOTRANSPLANT N/A 04/27/2018    Procedure: LIVER ALLOTRANSPLANTATION; ORTHOTOPIC, PARTIAL OR WHOLE, FROM CADAVER OR LIVING DONOR, ANY AGE;  Surgeon: Florene Glen, MD;  Location: MAIN OR [facility];  Service: Transplant   ??? PR TRANSPLANT,PREP DONOR LIVER, WHOLE N/A 04/27/2018    Procedure: [facility] STD PREP CAD DONOR WHOLE LIVER GFT PRIOR TNSPLNT,INC CHOLE,DISS/REM SURR TISSU WO TRISEG/LOBE SPLT;  Surgeon: Florene Glen, MD;  Location: MAIN OR [facility];  Service: Transplant   ??? PR UPPER GI ENDOSCOPY,DIAGNOSIS N/A 07/09/2017    Procedure: UGI ENDO, INCLUDE ESOPHAGUS, STOMACH, & DUODENUM &/OR JEJUNUM; DX W/WO COLLECTION SPECIMN, BY BRUSH OR WASH;  Surgeon: Pia Mau, MD;  Location: [facility];  Service: Gastroenterology       Social History     Socioeconomic History   ??? Marital status: Married     Spouse name: None   ??? Number of children: None   ??? Years of education: None   ??? Highest education level: None   Occupational History   ??? None   Social Needs   ??? Financial resource strain: None   ??? Food insecurity:     Worry: None     Inability: None   ??? Transportation needs:     Medical: None     Non-medical: None   Tobacco Use   ??? Smoking status: Former Smoker     Types: Cigarettes     Last attempt to quit: 1989     Years since quitting: 30.6   ??? Smokeless tobacco: Never Used   Substance and Sexual Activity   ??? Alcohol  use: No     Alcohol/week: 0.0 standard drinks   ??? Drug use: No   ??? Sexual activity: Yes     Partners: Male     Birth control/protection: Surgical   Lifestyle   ??? Physical activity:     Days per week: None     Minutes per session: None   ??? Stress: None   Relationships   ??? Social connections:     Talks on phone: None     Gets together: None     Attends religious service: None     Active member of club or organization: None     Attends meetings of clubs or organizations: None     Relationship status: None   Other Topics Concern   ??? Exercise No   ??? Living Situation Yes     Comment: spouse   Social History Narrative   ??? None     She is currently retired.     Family History   Problem Relation Age of Onset   ??? Cancer Sister    ??? Heart disease Mother    ??? Bleeding Disorder Mother    ??? Hypertension Mother    ??? Heart disease Father                     Review of Systems:   10 organ systems reviewed and pertinent as noted in HPI.      OBJECTIVE:     BP 127/65  - Pulse 83  - Temp 36.7 ??C (98.1 ??F)  - Ht 149.9 cm (4' 11.02)  - Wt 49.4 kg (109 lb)  - BMI 22.00 kg/m??     Physical Exam:     Vital Signs: Reviewed in Epic. Hemodynamically stable.   General: Appears comfortable. No obvious distress.  ENT: Mucous membranes moist. Oropharynx clear.  Neck: Supple.  Cardiovascular: Extremities warm and nonedematous. Regular rate and rhythm. 2+ radial pulses bilaterally.  Pulmonary: Breathing is comfortable and unlabored.   Integument: No obvious rashes or ecchymoses.  Psychiatry: Mood and affect appear appropriate to situation.  Abdomen: Soft, nontender, nondistended.  Genitourinary: Deferred  Rectal: Deferred.  Extremities: Warm and well-perfused. No cyanosis, clubbing, or edema.  Musculoskeletal: Normal muscle tone and bulk of spine.  Neurologic:  Awake, alert Oriented to name, location, time  LUE: 5/5 deltoid, 5/5 bicep, 5/5 tricep, 5/5 wrist extension, 5/5 wrist flexion, 5/5 hand grip, 5/5 hand intrinsics  RUE: 5/5 deltoid, 5/5 bicep, 5/5 tricep, 5/5 wrist extension, 5/5 wrist flexion, 5/5 hand grip, 5/5 hand intrinsics  LLE: 5/5 iliopsoas, 5/5 quadriceps, 5/5 hamstrings, 5/5 dorsiflexion, 5/5 plantarflexion, 5/5 EHL  RLE: 5/5 iliopsoas, 5/5 quadriceps, 5/5 hamstrings, 5/5 dorsiflexion, 5/5 plantarflexion, 5/5 EHL  Reflexes 2+ and symmetric in biceps/triceps/brachioradialis/patellae/ankles  Absent Hoffman sign bilaterally  No clonus bilaterally.  Sensation to light touch intact throughout.  gait wnl.     Diagnostic Studies:   She has had imaging that is viewable in the [facility] system today.  Lumbar spine AP/lateral standing x-rays demonstrate chronic in appearance T12 and L2 compression fractures.  There does appear to be some focal kyphosis at T12 although this cannot be fully evaluated on these images.  I was also able to review a chest x-ray from May 2019 that demonstrated a similar in appearance T12 fracture  Imaging reviewed with the patient.    Tula Nakayama, ANP  06/02/2018 3:48 PM

## 2018-06-03 LAB — TACROLIMUS BLOOD: Lab: 8.8

## 2018-06-03 LAB — HBV DNA COMMENT: Lab: 0

## 2018-06-03 LAB — HEPATITIS B DNA, ULTRAQUANTITATIVE, PCR: HBV DNA QUANT: NOT DETECTED

## 2018-06-04 ENCOUNTER — Ambulatory Visit: Admit: 2018-06-04 | Discharge: 2018-06-05 | Payer: PRIVATE HEALTH INSURANCE

## 2018-06-04 DIAGNOSIS — Z79899 Other long term (current) drug therapy: Secondary | ICD-10-CM

## 2018-06-04 DIAGNOSIS — Z944 Liver transplant status: Principal | ICD-10-CM

## 2018-06-04 LAB — COMPREHENSIVE METABOLIC PANEL
ALBUMIN: 4 g/dL (ref 3.5–5.0)
ALKALINE PHOSPHATASE: 61 U/L (ref 38–126)
ALT (SGPT): 41 U/L (ref 15–48)
ANION GAP: 12 mmol/L (ref 9–15)
BILIRUBIN TOTAL: 0.8 mg/dL (ref 0.0–1.2)
BLOOD UREA NITROGEN: 25 mg/dL — ABNORMAL HIGH (ref 7–21)
BUN / CREAT RATIO: 20
CALCIUM: 9.3 mg/dL (ref 8.5–10.2)
CHLORIDE: 100 mmol/L (ref 98–107)
CO2: 26 mmol/L (ref 22.0–30.0)
CREATININE: 1.23 mg/dL — ABNORMAL HIGH (ref 0.60–1.00)
EGFR CKD-EPI AA FEMALE: 55 mL/min/{1.73_m2} — ABNORMAL LOW (ref >=60)
EGFR CKD-EPI NON-AA FEMALE: 48 mL/min/{1.73_m2} — ABNORMAL LOW (ref >=60)
GLUCOSE RANDOM: 68 mg/dL (ref 65–99)
POTASSIUM: 4.1 mmol/L (ref 3.5–5.0)
PROTEIN TOTAL: 6.5 g/dL (ref 6.5–8.3)
SODIUM: 138 mmol/L (ref 135–145)

## 2018-06-04 LAB — CBC W/ AUTO DIFF
BASOPHILS ABSOLUTE COUNT: 0.1 10*9/L (ref 0.0–0.1)
BASOPHILS RELATIVE PERCENT: 1.1 %
EOSINOPHILS ABSOLUTE COUNT: 0.1 10*9/L (ref 0.0–0.4)
EOSINOPHILS RELATIVE PERCENT: 2.7 %
HEMATOCRIT: 33 % — ABNORMAL LOW (ref 36.0–46.0)
HEMOGLOBIN: 11.1 g/dL — ABNORMAL LOW (ref 13.5–16.0)
LARGE UNSTAINED CELLS: 1 % (ref 0–4)
LYMPHOCYTES ABSOLUTE COUNT: 1.1 10*9/L — ABNORMAL LOW (ref 1.5–5.0)
LYMPHOCYTES RELATIVE PERCENT: 22.7 %
MEAN CORPUSCULAR HEMOGLOBIN CONC: 33.8 g/dL (ref 31.0–37.0)
MEAN CORPUSCULAR VOLUME: 99.6 fL (ref 80.0–100.0)
MEAN PLATELET VOLUME: 7.6 fL (ref 7.0–10.0)
MONOCYTES ABSOLUTE COUNT: 0.1 10*9/L — ABNORMAL LOW (ref 0.2–0.8)
MONOCYTES RELATIVE PERCENT: 3.1 %
NEUTROPHILS ABSOLUTE COUNT: 3.2 10*9/L (ref 2.0–7.5)
NEUTROPHILS RELATIVE PERCENT: 69.6 %
PLATELET COUNT: 148 10*9/L — ABNORMAL LOW (ref 150–440)
RED BLOOD CELL COUNT: 3.31 10*12/L — ABNORMAL LOW (ref 4.00–5.20)
RED CELL DISTRIBUTION WIDTH: 18.5 % — ABNORMAL HIGH (ref 12.0–15.0)
WBC ADJUSTED: 4.7 10*9/L (ref 4.5–11.0)

## 2018-06-04 LAB — BILIRUBIN TOTAL: Bilirubin:MCnc:Pt:Ser/Plas:Qn:: 0.8

## 2018-06-04 LAB — GAMMA GLUTAMYL TRANSFERASE: Gamma glutamyl transferase:CCnc:Pt:Ser/Plas:Qn:: 41

## 2018-06-04 LAB — PHOSPHORUS: Phosphate:MCnc:Pt:Ser/Plas:Qn:: 5 — ABNORMAL HIGH

## 2018-06-04 LAB — BILIRUBIN DIRECT: Bilirubin.glucuronidated:MCnc:Pt:Ser/Plas:Qn:: 0.2

## 2018-06-04 LAB — MAGNESIUM: Magnesium:MCnc:Pt:Ser/Plas:Qn:: 1.8

## 2018-06-04 LAB — NEUTROPHILS ABSOLUTE COUNT: Lab: 3.2

## 2018-06-04 LAB — TACROLIMUS BLOOD: Lab: 9.3

## 2018-06-07 ENCOUNTER — Ambulatory Visit: Admit: 2018-06-07 | Discharge: 2018-06-08 | Payer: PRIVATE HEALTH INSURANCE

## 2018-06-07 DIAGNOSIS — Z944 Liver transplant status: Principal | ICD-10-CM

## 2018-06-07 DIAGNOSIS — Z79899 Other long term (current) drug therapy: Secondary | ICD-10-CM

## 2018-06-07 LAB — COMPREHENSIVE METABOLIC PANEL
ALBUMIN: 4 g/dL (ref 3.5–5.0)
ALKALINE PHOSPHATASE: 61 U/L (ref 38–126)
ALT (SGPT): 39 U/L (ref 15–48)
ANION GAP: 11 mmol/L (ref 9–15)
AST (SGOT): 15 U/L (ref 14–38)
BILIRUBIN TOTAL: 0.7 mg/dL (ref 0.0–1.2)
BLOOD UREA NITROGEN: 19 mg/dL (ref 7–21)
BUN / CREAT RATIO: 18
CALCIUM: 9.3 mg/dL (ref 8.5–10.2)
CO2: 26 mmol/L (ref 22.0–30.0)
EGFR CKD-EPI AA FEMALE: 66 mL/min/{1.73_m2} (ref >=60)
EGFR CKD-EPI NON-AA FEMALE: 57 mL/min/{1.73_m2} — ABNORMAL LOW (ref >=60)
GLUCOSE RANDOM: 69 mg/dL (ref 65–179)
POTASSIUM: 4.4 mmol/L (ref 3.5–5.0)
PROTEIN TOTAL: 6.6 g/dL (ref 6.5–8.3)

## 2018-06-07 LAB — BILIRUBIN DIRECT: Bilirubin.glucuronidated:MCnc:Pt:Ser/Plas:Qn:: 0.2

## 2018-06-07 LAB — CBC W/ AUTO DIFF
BASOPHILS ABSOLUTE COUNT: 0.1 10*9/L (ref 0.0–0.1)
BASOPHILS RELATIVE PERCENT: 1 %
EOSINOPHILS ABSOLUTE COUNT: 0.2 10*9/L (ref 0.0–0.4)
EOSINOPHILS RELATIVE PERCENT: 3.3 %
HEMATOCRIT: 33 % — ABNORMAL LOW (ref 36.0–46.0)
HEMOGLOBIN: 11.4 g/dL — ABNORMAL LOW (ref 13.5–16.0)
LARGE UNSTAINED CELLS: 1 % (ref 0–4)
LYMPHOCYTES ABSOLUTE COUNT: 1 10*9/L — ABNORMAL LOW (ref 1.5–5.0)
LYMPHOCYTES RELATIVE PERCENT: 20.6 %
MEAN CORPUSCULAR HEMOGLOBIN CONC: 34.5 g/dL (ref 31.0–37.0)
MEAN CORPUSCULAR HEMOGLOBIN: 34.3 pg — ABNORMAL HIGH (ref 26.0–34.0)
MEAN CORPUSCULAR VOLUME: 99.6 fL (ref 80.0–100.0)
MEAN PLATELET VOLUME: 7.7 fL (ref 7.0–10.0)
MONOCYTES RELATIVE PERCENT: 2.7 %
NEUTROPHILS ABSOLUTE COUNT: 3.6 10*9/L (ref 2.0–7.5)
PLATELET COUNT: 148 10*9/L — ABNORMAL LOW (ref 150–440)
RED BLOOD CELL COUNT: 3.31 10*12/L — ABNORMAL LOW (ref 4.00–5.20)
RED CELL DISTRIBUTION WIDTH: 18.4 % — ABNORMAL HIGH (ref 12.0–15.0)
WBC ADJUSTED: 5 10*9/L (ref 4.5–11.0)

## 2018-06-07 LAB — CO2: Carbon dioxide:SCnc:Pt:Ser/Plas:Qn:: 26

## 2018-06-07 LAB — PLATELET COUNT: Lab: 148 — ABNORMAL LOW

## 2018-06-07 LAB — MAGNESIUM: Magnesium:MCnc:Pt:Ser/Plas:Qn:: 1.6

## 2018-06-07 LAB — PHOSPHORUS: Phosphate:MCnc:Pt:Ser/Plas:Qn:: 4.8 — ABNORMAL HIGH

## 2018-06-07 LAB — GAMMA GLUTAMYL TRANSFERASE: Gamma glutamyl transferase:CCnc:Pt:Ser/Plas:Qn:: 37

## 2018-06-08 LAB — TACROLIMUS BLOOD: Lab: 8.7

## 2018-06-09 ENCOUNTER — Ambulatory Visit: Admit: 2018-06-09 | Discharge: 2018-06-10 | Payer: PRIVATE HEALTH INSURANCE

## 2018-06-09 DIAGNOSIS — Z79899 Other long term (current) drug therapy: Secondary | ICD-10-CM

## 2018-06-09 DIAGNOSIS — Z944 Liver transplant status: Principal | ICD-10-CM

## 2018-06-09 LAB — CBC W/ AUTO DIFF
BASOPHILS RELATIVE PERCENT: 1.5 %
EOSINOPHILS ABSOLUTE COUNT: 0.1 10*9/L (ref 0.0–0.4)
EOSINOPHILS RELATIVE PERCENT: 2.5 %
HEMATOCRIT: 34.8 % — ABNORMAL LOW (ref 36.0–46.0)
HEMOGLOBIN: 11.9 g/dL — ABNORMAL LOW (ref 13.5–16.0)
LARGE UNSTAINED CELLS: 1 % (ref 0–4)
LYMPHOCYTES ABSOLUTE COUNT: 1 10*9/L — ABNORMAL LOW (ref 1.5–5.0)
LYMPHOCYTES RELATIVE PERCENT: 23.9 %
MEAN CORPUSCULAR HEMOGLOBIN CONC: 34.2 g/dL (ref 31.0–37.0)
MEAN CORPUSCULAR HEMOGLOBIN: 34.2 pg — ABNORMAL HIGH (ref 26.0–34.0)
MEAN CORPUSCULAR VOLUME: 99.8 fL (ref 80.0–100.0)
MONOCYTES ABSOLUTE COUNT: 0.1 10*9/L — ABNORMAL LOW (ref 0.2–0.8)
MONOCYTES RELATIVE PERCENT: 2.6 %
NEUTROPHILS ABSOLUTE COUNT: 2.8 10*9/L (ref 2.0–7.5)
NEUTROPHILS RELATIVE PERCENT: 68.2 %
PLATELET COUNT: 166 10*9/L (ref 150–440)
RED BLOOD CELL COUNT: 3.49 10*12/L — ABNORMAL LOW (ref 4.00–5.20)
RED CELL DISTRIBUTION WIDTH: 18.4 % — ABNORMAL HIGH (ref 12.0–15.0)
WBC ADJUSTED: 4.1 10*9/L — ABNORMAL LOW (ref 4.5–11.0)

## 2018-06-09 LAB — COMPREHENSIVE METABOLIC PANEL
ALKALINE PHOSPHATASE: 57 U/L (ref 38–126)
ALT (SGPT): 44 U/L (ref 15–48)
ANION GAP: 12 mmol/L (ref 9–15)
AST (SGOT): 19 U/L (ref 14–38)
BILIRUBIN TOTAL: 0.7 mg/dL (ref 0.0–1.2)
BLOOD UREA NITROGEN: 17 mg/dL (ref 7–21)
BUN / CREAT RATIO: 19
CALCIUM: 9.2 mg/dL (ref 8.5–10.2)
CO2: 25 mmol/L (ref 22.0–30.0)
CREATININE: 0.88 mg/dL (ref 0.60–1.00)
EGFR CKD-EPI AA FEMALE: 83 mL/min/{1.73_m2} (ref >=60)
EGFR CKD-EPI NON-AA FEMALE: 72 mL/min/{1.73_m2} (ref >=60)
GLUCOSE RANDOM: 66 mg/dL (ref 65–179)
POTASSIUM: 3.9 mmol/L (ref 3.5–5.0)
PROTEIN TOTAL: 6.8 g/dL (ref 6.5–8.3)
SODIUM: 138 mmol/L (ref 135–145)

## 2018-06-09 LAB — GAMMA GLUTAMYL TRANSFERASE: Gamma glutamyl transferase:CCnc:Pt:Ser/Plas:Qn:: 35

## 2018-06-09 LAB — MAGNESIUM: Magnesium:MCnc:Pt:Ser/Plas:Qn:: 1.6

## 2018-06-09 LAB — TACROLIMUS BLOOD: Lab: 8.4

## 2018-06-09 LAB — PHOSPHORUS: Phosphate:MCnc:Pt:Ser/Plas:Qn:: 4.6

## 2018-06-09 LAB — MEAN CORPUSCULAR HEMOGLOBIN CONC: Lab: 34.2

## 2018-06-09 LAB — BILIRUBIN DIRECT: Bilirubin.glucuronidated:MCnc:Pt:Ser/Plas:Qn:: 0.3

## 2018-06-09 LAB — ALKALINE PHOSPHATASE: Alkaline phosphatase:CCnc:Pt:Ser/Plas:Qn:: 57

## 2018-06-09 NOTE — Unmapped (Signed)
error 

## 2018-06-10 ENCOUNTER — Ambulatory Visit
Admit: 2018-06-10 | Discharge: 2018-06-10 | Payer: PRIVATE HEALTH INSURANCE | Attending: Pharmacist Clinician (PhC)/ Clinical Pharmacy Specialist | Primary: Pharmacist Clinician (PhC)/ Clinical Pharmacy Specialist

## 2018-06-10 ENCOUNTER — Ambulatory Visit
Admit: 2018-06-10 | Discharge: 2018-06-10 | Payer: PRIVATE HEALTH INSURANCE | Attending: Transplant Surgery | Primary: Transplant Surgery

## 2018-06-10 ENCOUNTER — Ambulatory Visit: Admit: 2018-06-10 | Discharge: 2018-06-11 | Payer: PRIVATE HEALTH INSURANCE

## 2018-06-10 DIAGNOSIS — Z944 Liver transplant status: Principal | ICD-10-CM

## 2018-06-10 DIAGNOSIS — Z79899 Other long term (current) drug therapy: Secondary | ICD-10-CM

## 2018-06-10 LAB — CBC W/ AUTO DIFF
BASOPHILS ABSOLUTE COUNT: 0.1 10*9/L (ref 0.0–0.1)
BASOPHILS RELATIVE PERCENT: 1.6 %
EOSINOPHILS ABSOLUTE COUNT: 0.1 10*9/L (ref 0.0–0.4)
EOSINOPHILS RELATIVE PERCENT: 2.2 %
HEMATOCRIT: 37.6 % (ref 36.0–46.0)
HEMOGLOBIN: 11.8 g/dL — ABNORMAL LOW (ref 12.0–16.0)
LARGE UNSTAINED CELLS: 1 % (ref 0–4)
LYMPHOCYTES ABSOLUTE COUNT: 0.9 10*9/L — ABNORMAL LOW (ref 1.5–5.0)
LYMPHOCYTES RELATIVE PERCENT: 21.6 %
MEAN CORPUSCULAR HEMOGLOBIN: 31.7 pg (ref 26.0–34.0)
MEAN CORPUSCULAR VOLUME: 100.8 fL — ABNORMAL HIGH (ref 80.0–100.0)
MONOCYTES ABSOLUTE COUNT: 0.1 10*9/L — ABNORMAL LOW (ref 0.2–0.8)
MONOCYTES RELATIVE PERCENT: 3.3 %
NEUTROPHILS ABSOLUTE COUNT: 2.9 10*9/L (ref 2.0–7.5)
NEUTROPHILS RELATIVE PERCENT: 70.3 %
PLATELET COUNT: 194 10*9/L (ref 150–440)
RED BLOOD CELL COUNT: 3.74 10*12/L — ABNORMAL LOW (ref 4.00–5.20)
RED CELL DISTRIBUTION WIDTH: 17.8 % — ABNORMAL HIGH (ref 12.0–15.0)
WBC ADJUSTED: 4.2 10*9/L — ABNORMAL LOW (ref 4.5–11.0)

## 2018-06-10 LAB — COMPREHENSIVE METABOLIC PANEL
ALBUMIN: 3.9 g/dL (ref 3.5–5.0)
ALKALINE PHOSPHATASE: 53 U/L (ref 38–126)
ALT (SGPT): 38 U/L (ref 15–48)
AST (SGOT): 15 U/L (ref 14–38)
BILIRUBIN TOTAL: 0.7 mg/dL (ref 0.0–1.2)
BLOOD UREA NITROGEN: 16 mg/dL (ref 7–21)
BUN / CREAT RATIO: 18
CALCIUM: 9.2 mg/dL (ref 8.5–10.2)
CHLORIDE: 100 mmol/L (ref 98–107)
CO2: 27 mmol/L (ref 22.0–30.0)
CREATININE: 0.89 mg/dL (ref 0.60–1.00)
EGFR CKD-EPI AA FEMALE: 81 mL/min/{1.73_m2} (ref >=60)
EGFR CKD-EPI NON-AA FEMALE: 71 mL/min/{1.73_m2} (ref >=60)
GLUCOSE RANDOM: 64 mg/dL — ABNORMAL LOW (ref 65–99)
POTASSIUM: 3.7 mmol/L (ref 3.5–5.0)
PROTEIN TOTAL: 6.6 g/dL (ref 6.5–8.3)
SODIUM: 137 mmol/L (ref 135–145)

## 2018-06-10 LAB — SMEAR REVIEW

## 2018-06-10 LAB — GAMMA GLUTAMYL TRANSFERASE: Gamma glutamyl transferase:CCnc:Pt:Ser/Plas:Qn:: 35

## 2018-06-10 LAB — MAGNESIUM: Magnesium:MCnc:Pt:Ser/Plas:Qn:: 1.7

## 2018-06-10 LAB — TACROLIMUS BLOOD: Lab: 6.4

## 2018-06-10 LAB — HEMATOCRIT: Lab: 37.6

## 2018-06-10 LAB — BILIRUBIN DIRECT: Bilirubin.glucuronidated:MCnc:Pt:Ser/Plas:Qn:: 0.2

## 2018-06-10 LAB — PHOSPHORUS: Phosphate:MCnc:Pt:Ser/Plas:Qn:: 4

## 2018-06-10 LAB — SODIUM: Sodium:SCnc:Pt:Ser/Plas:Qn:: 137

## 2018-06-10 NOTE — Unmapped (Signed)
Patient called to inquire if she needed to complete labs again tomorrow. Let her know that she did not, since she completed them today. Explained that next week she would only need to go twice, d/t the holiday. She verbalized understanding.

## 2018-06-10 NOTE — Unmapped (Signed)
TRANSPLANT SURGERY PROGRESS NOTE    Assessment and Plan    Sara Sanders is a 62 y.o. 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 She had no surgical complications immediately post-operatively.     Recommendations:    #S/p OLT: Doing well overall. Continues to have drainage from left side of incision, but amount is decreasing and appears non-infected. Will continue to monitor.   []  1/2 of staples removed today. Reassess at next outpatient appointment in 2 weeks   []  Immunosuppression per txp pharmacy (myfortic, steroids, and tacro)   []  Tacro 7 mg BID, f/u level from today's labs    []  Bactrim PCP ppx   []  Ursodiol in setting of intraoperative stent     #RA:  T12/L2 compression fracture, borderline widening of anterior atlantodenta interval and grade 1 anterolisthesis at C2-C3. No focal osseous erosion seen on spine. Seen by spine service on 8/21 and they are managing associated pain.    #Fluid overload, resolved: No edema and lungs CTAB since stopping lasix last visit    #C. Diff colitis, resolved: Normal BMs now. Finished with oral vanc regimen since last visit    #Mobility: Improved, ambulating. Did not require wheelchair in hospital today    Subjective  Sara Sanders is a 62 y.o. 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 She had no surgical complications immediately post-operatively.     Last seen in clinic 05/27/18 with resolving C diff, some drainage from left-sided incision, and back pain 2/2 to possible RA flare and lower extremity swelling. Today she is doing well. Tolerating diet with no N/V and normal BMs. She denies any F/C, chest pain, or SOB. She was seen by Spine service on 8/21 and they are managing her back/neck pain. Still reports small amount of drainage from incision, but has decreased and no odor or signs of infection.    Objective    Vitals:    06/10/18 1037   BP: 138/78   Pulse: 74   Temp: 36.4 ??C   TempSrc: Tympanic   SpO2: 98%   Weight: 49.8 kg (109 lb 11.2 oz)   Height: 149.9 cm (4' 11)      Body mass index is 22.16 kg/m??.     Physical Exam:    General Appearance:  Thin female, mild distress from pain, sitting in a wheelchair.   Lungs:                          Clear to auscultation bilaterally.  Heart:                           Regular rate and rhythm  Abdomen:                    Staples in place. No significant erythema, fluctuance. At left most site of staple line, she has mild sourous drainage. No erythema or fluctuance. Soft. NT, ND  Extremities:                 No clubbing, cyanosis, or edema  ??    Data Review:  Lab Results   Component Value Date    WBC 4.2 (  L) 06/10/2018    HGB 11.8 (L) 06/10/2018    HCT 37.6 06/10/2018    PLT 194 06/10/2018       Lab Results   Component Value Date    NA 137 06/10/2018    K 3.7 06/10/2018    CL 100 06/10/2018    CO2 27.0 06/10/2018    BUN 16 06/10/2018    CREATININE 0.89 06/10/2018    GLU 64 (L) 06/10/2018    CALCIUM 9.2 06/10/2018    MG 1.7 06/10/2018    PHOS 4.0 06/10/2018       Lab Results   Component Value Date    BILITOT 0.7 06/10/2018    BILIDIR 0.20 06/10/2018    PROT 6.6 06/10/2018    ALBUMIN 3.9 06/10/2018    ALT 38 06/10/2018    AST 15 06/10/2018    ALKPHOS 53 06/10/2018    GGT 35 06/10/2018       Lab Results   Component Value Date    LABPROT 16.2 (H) 03/17/2018    INR 1.09 05/02/2018    APTT 25.5 (L) 04/29/2018         Imaging:    EXAM: XR CERVICAL SPINE AP AND LATERAL  DATE: 05/17/2018 9:45 AM        Impression       Borderline widening of the anterior atlantodental interval and minimal grade 1 anterolisthesis at C2-3. Lateral flexion and extension views may be obtained for further evaluation if clinically indicated.    Advanced degenerative change of the cervical spine without focal osseous erosion.       XR Lumbar Spine 05/17/18  Impression       Moderate to advanced spondylosis of the lumbar spine.    Chronic compression deformities of the T12 and L2 vertebral bodies. No new or progressive compression deformity.     05/17/18  EXAM: XR CHEST 2 VIEWS        Impression       Stable appearing small right pleural effusion and right basilar atelectasis.    Stable appearing moderate to severe compression deformity in lower thoracic spine.

## 2018-06-10 NOTE — Unmapped (Signed)
[facility] TRANSPLANT CLINIC PHARMACY NOTE  06/10/2018   CARSYN BOSTER  [id number]    Medication changes today:   - none    Education/Adherence tools provided today:  1.provided additional education on immunosuppression and transplant related medications including reviewing indications of medications, dosing and side effects     Follow up items:  1. goal of understanding indications and dosing of immunosuppression medications   2. Follow up with plan for RA  3. Follow up TPMT level and plan for transition to azathioprine  4. Pending vitamin D level from today  5. Monitor back pain    Next visit with pharmacy in 1-2 weeks  ____________________________________________________________________    Unknown Foley is a 62 y.o. 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     Other PMH significant for ascites, esophageal varices, hypothyroidism, RA, HTN  Seen by pharmacy today for: medication management; last seen by pharmacy 2 weeks ago    CC: Patient had no complaints    Vitals:    06/10/18 1035   BP: 138/78   Pulse: 74   Temp: 36.4 ??C (97.5 ??F)   SpO2: 98%       No Known Allergies    All medications reviewed and updated.     Medication list includes revisions made during today???s encounter    Outpatient Encounter Medications as of 06/10/2018   Medication Sig Dispense Refill   ??? acetaminophen (TYLENOL) 325 MG tablet TAKE 2 TABLETS BY MOUTH EVERY 4 HOURS AS NEEDED FOR PAIN. MAX 9 TABLETS (3000 MG) PER DAY. 100 each 2   ??? aspirin (ECOTRIN) 81 MG tablet TAKE 1 TABLET BY MOUTH ONCE DAILY 120 tablet 11   ??? calcium carbonate-vitamin D3 600 mg(1,500mg ) -200 unit per tablet Take 1 tablet by mouth daily.      ??? golimumab (SIMPONI) 50 mg/0.5 mL PnIj pen injector HOLD (Patient not taking: Reported on 06/02/2018)  0   ??? levothyroxine (SYNTHROID) 75 MCG tablet Take 1 tablet (75 mcg total) by mouth daily. 30 tablet 11   ??? magnesium oxide (MAG-OX) 400 mg (241.3 mg magnesium) tablet Take 1 tablet (400 mg total) by mouth daily. 60 tablet 11   ??? mycophenolate (MYFORTIC) 180 MG EC tablet Take 2 tablets (360 mg total) by mouth Two (2) times a day. 180 tablet 11   ??? omeprazole (PRILOSEC) 40 MG capsule TAKE 1 CAPSULE BY MOUTH TWICE DAILY 60 capsule 0   ??? predniSONE (DELTASONE) 10 MG tablet TAKE 2 TABLETS (20 MG) BY MOUTH DAILY 60 tablet 1   ??? PROGRAF 1 mg capsule Take 7 capsules (7 mg total) by mouth two (2) times a day. 480 capsule 11   ??? sulfamethoxazole-trimethoprim (BACTRIM,SEPTRA) 400-80 mg per tablet TAKE 1 TABLET BY MOUTH 3 TIMES WEEKLY 12 each 5   ??? traMADol (ULTRAM) 50 mg tablet TAKE 1 TABLET BY MOUTH TWICE DAILY AS NEEDED FOR PAIN 60 tablet 1   ??? ursodiol (ACTIGALL) 300 mg capsule Take 1 capsule (300 mg total) by mouth Two (2) times a day. 60 each 11   ??? valGANciclovir (VALCYTE) 450 mg tablet TAKE 1 TABLET BY MOUTH ONCE DAILY 30 tablet 2   ??? [DISCONTINUED] vancomycin (VANCOCIN) 125 MG capsule Take 1 capsule (125 mg total) by mouth Four (4) times a day. for 10 days 40 capsule 0     No facility-administered encounter medications on file as of 06/10/2018.      Induction agent : steroids only  CURRENT IMMUNOSUPPRESSION: tacrolimus 7 mg PO BID  prograf/cyclosporine goal: 8-10   myfortic360  mg PO bid    prednisone 20mg  once daily  - Patient's PMH includes rheumatoid arthritis and was previously taking golimumab. Golimumab was stopped few months prior to transplant d/t concern for infection. Patient experienced significant RA flare up. Prednisone and myfortic dose were both increased in hopes to control the flare. Patient reports that the flare up has resolved now, but her RA-associated wrist pain has not gotten any better    Patient is tolerating immunosuppression well    IMMUNOSUPPRESSION DRUG LEVELS:  Lab Results   Component Value Date    TACROLIMUS 6.4 06/10/2018    TACROLIMUS 8.4 06/09/2018    TACROLIMUS 8.7 06/07/2018    TACROLIMUS 9.3 05/28/2018     No results found for: CYCLO  No results found for: EVEROLIMUS No results found for: SIROLIMUS    Prograf level is accurate 12 hour trough     Graft function: improving   DSA: ntd  Biopsies to date:   ?? 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 Patient reported that it was working really well for her.   Plan: We will consider the possibility to switch the patient to azathioprine for both immunosuppression and control of RA. Will obtain TPMT level. In the setting of liver transplant, we would need to genotype donor blood to detect a true polymorphism. Will order TPMT and see if we can test donor blood TPMT. Also asked coordinator to put in a referral to [facility] rheumatology.    ID prophylaxis:   CMV Status: D-/ R+, moderate risk. CMV prophylaxis: valganciclovir 450 mg daily x 3 months per protocol.  No results found for: CMVCP  PCP Prophylaxis: bactrim SS 1 tab MWF x 6 months.  Thrush:  completed in hospital  Patient is  tolerating infectious prophylaxis well. Patient was started on vancomycin 125mg  PO four times daily x 10 days for C. Diff infection on 05/21/2018, which she has completed on 8/19. Diarrhea is resolved.   Plan: Continue per protocol. Continue to monitor.    CV Prophylaxis: asa 81 mg   The 10-year ASCVD risk score Denman George DC Jr., et al., 2013) is: 4.2%  Statin therapy: Not indicated  Plan: Continue to monitor     BP: Goal < 140/90. Clinic vitals reported above  Home BP ranges:    AM PM   06/05/2018 133/87; HR 72 118/71; 73   06/06/2018 129/68; HR 72 126/69; HR 71   06/07/2018 145/75; HR 73 127/70; HR 77   06/08/2018 129/75; HR 77 126/68; HR 74   06/09/2018 132/72; HR 80 131/75; HR 77   06/10/2018 142/75; HR 70      Current meds include: none  Plan: Continue to monitor Anemia:  H/H:   Lab Results   Component Value Date    HGB 11.8 (L) 06/10/2018     Lab Results   Component Value Date    HCT 37.6 06/10/2018     Iron panel:  Lab Results   Component Value Date    IRON 82 12/08/2017    TIBC  12/08/2017      Comment:      Unable to calculate.      FERRITIN 539.0 (H) 12/08/2017  Lab Results   Component Value Date    LABIRON  12/08/2017      Comment:      Unable to calculate.       Prior ESA use: none  Plan: Continue to monitor.     DM:   Lab Results   Component Value Date    A1C 4.2 (L) 04/27/2018   . Goal A1c < 7  History of Dm? No  Established with endocrinologist/PCP for BG managment? No  Hypoglycemia: no  Plan:  Continue to monitor.    Electrolytes: wnl   Meds currently on: magnesium oxide 400mg  once daily  Plan: Continue current therapy. Continue to monitor     GI/BM: Pt reports 1-2BM (had frequent diarrheal episodes when initially diagnosed with c.diff, BM has came back down to baseline now)  Meds currently on: miralax - not taking  Plan: remove miralax from med list. Continue to monitor    Pain: pt reports moderate pain  Meds currently on: acetaminophen 650mg  Q8H PRN alternating with tramadol 50mg  Q4H PRN- this regimen is per RA provider to help with joint pain since pt can not take golimumab at this time. Patient reported needing both medications daily because of her back pain. She was able to cut down her tramadol dose to 1 tab once a day as needed . She is still using tylenol 1-2 tablets 1-2 times a day as needed.   Plan: Encouraged using acetaminophen for mild to moderate pain, no more than 3000mg  daily. Continue to monitor    Bone health:   Vitamin D Level: pending. Goal > 30.   Last DEXA results: 07/14/2017:   - The bone mineral density in the spine measuring L1, L3 and L4 measures 0.976 gm/cm2. ??The ??Z score is 0.7 and the T score is -0.7.  - The total bone mineral density in the proximal left femur measures 0.758 gm/cm2. ??The Z score is -0.6 and the T score is -1.5. Patient was seen by the spine center last week. Imagine demonstrates chronic compression fracture and the acute worsening in back pin is mostly musculoskeletal. Patient was recommended to alternate tylenol and tramadol for pain management for now.    Current meds include: calcium carb-vitamin D3 600mg -200 units/tab: 1 tab once daily  Plan: Vitamin D level  pending,continue supplementation. Continue to monitor.     Women's/Men's Health:  PAULINA MUCHMORE is a 62 y.o. Female perimenopausal. Patient reports no men's/women's health issues  Plan: Continue to monitor    Other: RA - defer to local rheumatology for treatment plan. Pt has been on golimumab, but d/t increased risk of infection at this time, we will not be restarting it.  Flares were initially being managed by increased prednisone and myfortic + tramadol and tylenol post-transplant. Myfortic dose decreased in the setting of C. Diff infection at last visit.     Patient reported that her rheumatologist had brought up the possibility of starting steroid injection around her wrist, since that is where she experiences most of her RA-associated pain. Patient to touch base with transplant MD for further plan.     Adherence: Patient has average understanding of medications; was able to independently identify names/doses of immunosuppressants and OI meds.  Patient  does fill their own pill box on a regular basis at home. - husband fills med box and is primary caretaker of medications  Patient brought medication card:yes  Pill box:did not bring  Plan: provided basic adherence counseling/intervention    Spent approximately 30  minutes on educating this patient and greater than 50% was spent in direct face to face counseling regarding post transplant medication education. Questions and concerns were address to patient's satisfaction.    Patient was reviewed with Dr. Matilde Haymaker who was agreement with the stated plan:     During this visit, the following was completed:   BP log data assessment  Labs ordered and evaluated  complex treatment plan >1 DS   Patient education was completed for 25-60 minutes     All questions/concerns were addressed to the patient's satisfaction.  __________________________________________  Lucy Chris, PharmD  PGY-1 Pharmacy Resident  PAGER [phone]    PATIENT SEEN AND EVALUATED BY:  Noah Charon, PHARMD, CPP  [employer] TRANSPLANT  PAGER [phone]

## 2018-06-10 NOTE — Unmapped (Signed)
Patient seen in  txpclinic today for her 6 wk f/u, accompanied by her husband. She reports she is feeling much better, stating she is sleeping well, eating well, and feeling much less emotional. She denies n/v/d/constipation, night sweats, fevers or chills. She has no swelling since stopping her diuretics. She reports she is drinking at least 2 liters of fluid daily and says she has missed no doses of IS, assuring today's trough would be valid.Her incision site is well approximated except for ~2cm area to left wing, which continues to have small amt. drainage, which appears yellow-brown on the dressing. It is not purulent or malodorous and incision is not reddened or swollen. Patient examined by Dr.Vijay who recommended every other staple be removed today with plan for patient to return in 2 wks for removal of remainder. She was seen by the spine center last week, who recommended she continue to use tylenol/tramadol for RA pain. She said pain is decreasing and her activity level is increasing. She inquired if she can have steroid injections in her hand, per her RA provider, which Dr.Hayashi said would be fine. Per txp pharmacist, in discussion with Dr.Hayashi, it would be better for patient's RA to begin azathioprine if she can tolerate. Directed by Dr.Lee to obtain patient's TPMT (thiopurine methyltransferase) as well as that from donor's blood if available and evaluate for imuran therapy. Spent 5 [email or web address] patient on continued wound care, advising that it is fine for her to shower daily, but not to bathe with staples. Patient brought jury duty notice, requesting release letter be written for her.

## 2018-06-11 NOTE — Unmapped (Signed)
Reviewed patient's 8/29 subpar tac level with txp pharmacist, Dr.Lee, who recommended no intervention at this time. Will continue to monitor with twice weekly labs next week.

## 2018-06-15 ENCOUNTER — Ambulatory Visit: Admit: 2018-06-15 | Discharge: 2018-06-16 | Payer: PRIVATE HEALTH INSURANCE

## 2018-06-15 DIAGNOSIS — Z944 Liver transplant status: Principal | ICD-10-CM

## 2018-06-15 DIAGNOSIS — Z79899 Other long term (current) drug therapy: Secondary | ICD-10-CM

## 2018-06-15 LAB — BILIRUBIN DIRECT: Bilirubin.glucuronidated:MCnc:Pt:Ser/Plas:Qn:: 0.2

## 2018-06-15 LAB — CBC W/ AUTO DIFF
BASOPHILS RELATIVE PERCENT: 1.1 %
EOSINOPHILS RELATIVE PERCENT: 1.9 %
HEMATOCRIT: 35.9 % — ABNORMAL LOW (ref 36.0–46.0)
HEMOGLOBIN: 11.8 g/dL — ABNORMAL LOW (ref 13.5–16.0)
LYMPHOCYTES ABSOLUTE COUNT: 0.9 10*9/L — ABNORMAL LOW (ref 1.5–5.0)
LYMPHOCYTES RELATIVE PERCENT: 24.9 %
MEAN CORPUSCULAR HEMOGLOBIN CONC: 33 g/dL (ref 31.0–37.0)
MEAN CORPUSCULAR HEMOGLOBIN: 33.1 pg (ref 26.0–34.0)
MEAN CORPUSCULAR VOLUME: 100.3 fL — ABNORMAL HIGH (ref 80.0–100.0)
MEAN PLATELET VOLUME: 7.9 fL (ref 7.0–10.0)
MONOCYTES ABSOLUTE COUNT: 0.1 10*9/L — ABNORMAL LOW (ref 0.2–0.8)
MONOCYTES RELATIVE PERCENT: 3.1 %
NEUTROPHILS ABSOLUTE COUNT: 2.5 10*9/L (ref 2.0–7.5)
NEUTROPHILS RELATIVE PERCENT: 67.9 %
RED BLOOD CELL COUNT: 3.58 10*12/L — ABNORMAL LOW (ref 4.00–5.20)
RED CELL DISTRIBUTION WIDTH: 17.7 % — ABNORMAL HIGH (ref 12.0–15.0)
WBC ADJUSTED: 3.7 10*9/L — ABNORMAL LOW (ref 4.5–11.0)

## 2018-06-15 LAB — COMPREHENSIVE METABOLIC PANEL
ALKALINE PHOSPHATASE: 49 U/L (ref 38–126)
ALT (SGPT): 33 U/L (ref 15–48)
ANION GAP: 9 mmol/L (ref 9–15)
AST (SGOT): 14 U/L (ref 14–38)
BILIRUBIN TOTAL: 0.7 mg/dL (ref 0.0–1.2)
BLOOD UREA NITROGEN: 23 mg/dL — ABNORMAL HIGH (ref 7–21)
CALCIUM: 9.4 mg/dL (ref 8.5–10.2)
CHLORIDE: 102 mmol/L (ref 98–107)
CO2: 27 mmol/L (ref 22.0–30.0)
CREATININE: 1.08 mg/dL — ABNORMAL HIGH (ref 0.60–1.00)
EGFR CKD-EPI AA FEMALE: 64 mL/min/{1.73_m2} (ref >=60)
EGFR CKD-EPI NON-AA FEMALE: 56 mL/min/{1.73_m2} — ABNORMAL LOW (ref >=60)
GLUCOSE RANDOM: 75 mg/dL (ref 65–179)
PROTEIN TOTAL: 6.6 g/dL (ref 6.5–8.3)
SODIUM: 138 mmol/L (ref 135–145)

## 2018-06-15 LAB — ALT (SGPT): Alanine aminotransferase:CCnc:Pt:Ser/Plas:Qn:: 33

## 2018-06-15 LAB — MAGNESIUM: Magnesium:MCnc:Pt:Ser/Plas:Qn:: 1.9

## 2018-06-15 LAB — NEUTROPHILS ABSOLUTE COUNT: Lab: 2.5

## 2018-06-15 LAB — GAMMA GLUTAMYL TRANSFERASE: Gamma glutamyl transferase:CCnc:Pt:Ser/Plas:Qn:: 27

## 2018-06-15 LAB — VITAMIN D 25 HYDROXY: VITAMIN D, TOTAL (25OH): 30.2 ng/mL (ref 20.0–80.0)

## 2018-06-15 LAB — PHOSPHORUS: Phosphate:MCnc:Pt:Ser/Plas:Qn:: 4.5

## 2018-06-15 LAB — VITAMIN D, TOTAL (25OH): Lab: 30.2

## 2018-06-15 NOTE — Unmapped (Signed)
Patient called to inquire if the endoscopy scheduled for next week would be necessary. Dr.Shah said it was not warranted at this time, from his standpoint. Appt.canceled and patient notified.

## 2018-06-16 LAB — TACROLIMUS BLOOD: Lab: 8.4

## 2018-06-16 NOTE — Unmapped (Signed)
Patient summoned for jury duty in mid-September. Due to her recent surgery and extensive immune suppression, physician's note created to excuse her from it. Mailed it to her with her request for jury duty release form. Left VM explaining all and notifying her of her upcoming txp surgery appt on 9/12.

## 2018-06-16 NOTE — Unmapped (Signed)
Received liver donor bx slides from [facility]. Copied bx report form [facility] and took information to Pathology for reading. Contacted [facility] to place on agenda for next pathology conference.

## 2018-06-17 ENCOUNTER — Ambulatory Visit: Admit: 2018-06-17 | Discharge: 2018-06-18 | Payer: PRIVATE HEALTH INSURANCE

## 2018-06-17 DIAGNOSIS — Z944 Liver transplant status: Principal | ICD-10-CM

## 2018-06-17 DIAGNOSIS — Z79899 Other long term (current) drug therapy: Secondary | ICD-10-CM

## 2018-06-17 LAB — COMPREHENSIVE METABOLIC PANEL
ALBUMIN: 4.2 g/dL (ref 3.5–5.0)
ALKALINE PHOSPHATASE: 52 U/L (ref 38–126)
ALT (SGPT): 31 U/L (ref 15–48)
ANION GAP: 11 mmol/L (ref 9–15)
AST (SGOT): 13 U/L — ABNORMAL LOW (ref 14–38)
BILIRUBIN TOTAL: 0.8 mg/dL (ref 0.0–1.2)
BLOOD UREA NITROGEN: 22 mg/dL — ABNORMAL HIGH (ref 7–21)
BUN / CREAT RATIO: 22
CALCIUM: 9.6 mg/dL (ref 8.5–10.2)
CHLORIDE: 102 mmol/L (ref 98–107)
CO2: 24 mmol/L (ref 22.0–30.0)
CREATININE: 0.99 mg/dL (ref 0.60–1.00)
EGFR CKD-EPI AA FEMALE: 72 mL/min/{1.73_m2} (ref >=60)
EGFR CKD-EPI NON-AA FEMALE: 62 mL/min/{1.73_m2} (ref >=60)
GLUCOSE RANDOM: 95 mg/dL (ref 65–179)
POTASSIUM: 4.4 mmol/L (ref 3.5–5.0)
PROTEIN TOTAL: 6.8 g/dL (ref 6.5–8.3)

## 2018-06-17 LAB — CBC W/ AUTO DIFF
BASOPHILS ABSOLUTE COUNT: 0 10*9/L (ref 0.0–0.1)
BASOPHILS RELATIVE PERCENT: 0.3 %
EOSINOPHILS ABSOLUTE COUNT: 0 10*9/L (ref 0.0–0.4)
EOSINOPHILS RELATIVE PERCENT: 0.7 %
HEMATOCRIT: 35.6 % — ABNORMAL LOW (ref 36.0–46.0)
HEMOGLOBIN: 12 g/dL — ABNORMAL LOW (ref 13.5–16.0)
LARGE UNSTAINED CELLS: 1 % (ref 0–4)
LYMPHOCYTES RELATIVE PERCENT: 12.8 %
MEAN CORPUSCULAR HEMOGLOBIN CONC: 33.8 g/dL (ref 31.0–37.0)
MEAN CORPUSCULAR HEMOGLOBIN: 33.8 pg (ref 26.0–34.0)
MEAN CORPUSCULAR VOLUME: 100 fL (ref 80.0–100.0)
MEAN PLATELET VOLUME: 7.4 fL (ref 7.0–10.0)
MONOCYTES ABSOLUTE COUNT: 0.2 10*9/L (ref 0.2–0.8)
MONOCYTES RELATIVE PERCENT: 4.3 %
NEUTROPHILS ABSOLUTE COUNT: 4.3 10*9/L (ref 2.0–7.5)
PLATELET COUNT: 157 10*9/L (ref 150–440)
RED BLOOD CELL COUNT: 3.56 10*12/L — ABNORMAL LOW (ref 4.00–5.20)
RED CELL DISTRIBUTION WIDTH: 17.4 % — ABNORMAL HIGH (ref 12.0–15.0)
WBC ADJUSTED: 5.3 10*9/L (ref 4.5–11.0)

## 2018-06-17 LAB — TACROLIMUS BLOOD: Lab: 6.6

## 2018-06-17 LAB — EGFR CKD-EPI NON-AA FEMALE: Lab: 62

## 2018-06-17 LAB — MAGNESIUM: Magnesium:MCnc:Pt:Ser/Plas:Qn:: 1.8

## 2018-06-17 LAB — BILIRUBIN DIRECT: Bilirubin.glucuronidated:MCnc:Pt:Ser/Plas:Qn:: 0.2

## 2018-06-17 LAB — PHOSPHORUS: Phosphate:MCnc:Pt:Ser/Plas:Qn:: 4.4

## 2018-06-17 LAB — MEAN CORPUSCULAR HEMOGLOBIN: Lab: 33.8

## 2018-06-17 LAB — GAMMA GLUTAMYL TRANSFERASE: Gamma glutamyl transferase:CCnc:Pt:Ser/Plas:Qn:: 26

## 2018-06-21 ENCOUNTER — Ambulatory Visit: Admit: 2018-06-21 | Discharge: 2018-06-22 | Payer: PRIVATE HEALTH INSURANCE

## 2018-06-21 DIAGNOSIS — Z79899 Other long term (current) drug therapy: Secondary | ICD-10-CM

## 2018-06-21 DIAGNOSIS — Z944 Liver transplant status: Principal | ICD-10-CM

## 2018-06-21 LAB — CBC W/ AUTO DIFF
BASOPHILS ABSOLUTE COUNT: 0 10*9/L (ref 0.0–0.1)
BASOPHILS RELATIVE PERCENT: 0.8 %
EOSINOPHILS ABSOLUTE COUNT: 0.1 10*9/L (ref 0.0–0.4)
EOSINOPHILS RELATIVE PERCENT: 1 %
HEMATOCRIT: 38.5 % (ref 36.0–46.0)
HEMOGLOBIN: 12.9 g/dL — ABNORMAL LOW (ref 13.5–16.0)
LARGE UNSTAINED CELLS: 1 % (ref 0–4)
LYMPHOCYTES RELATIVE PERCENT: 16.3 %
MEAN CORPUSCULAR HEMOGLOBIN CONC: 33.6 g/dL (ref 31.0–37.0)
MEAN CORPUSCULAR HEMOGLOBIN: 33.6 pg (ref 26.0–34.0)
MEAN CORPUSCULAR VOLUME: 99.9 fL (ref 80.0–100.0)
MEAN PLATELET VOLUME: 7.1 fL (ref 7.0–10.0)
MONOCYTES ABSOLUTE COUNT: 0.2 10*9/L (ref 0.2–0.8)
MONOCYTES RELATIVE PERCENT: 4.6 %
NEUTROPHILS ABSOLUTE COUNT: 3.6 10*9/L (ref 2.0–7.5)
NEUTROPHILS RELATIVE PERCENT: 76.4 %
PLATELET COUNT: 142 10*9/L — ABNORMAL LOW (ref 150–440)
RED CELL DISTRIBUTION WIDTH: 17 % — ABNORMAL HIGH (ref 12.0–15.0)
WBC ADJUSTED: 4.7 10*9/L (ref 4.5–11.0)

## 2018-06-21 LAB — COMPREHENSIVE METABOLIC PANEL
ALBUMIN: 4 g/dL (ref 3.5–5.0)
ALKALINE PHOSPHATASE: 55 U/L (ref 38–126)
ALT (SGPT): 31 U/L (ref 15–48)
ANION GAP: 12 mmol/L (ref 9–15)
AST (SGOT): 14 U/L (ref 14–38)
BLOOD UREA NITROGEN: 25 mg/dL — ABNORMAL HIGH (ref 7–21)
BUN / CREAT RATIO: 28
CALCIUM: 9.3 mg/dL (ref 8.5–10.2)
CHLORIDE: 102 mmol/L (ref 98–107)
CO2: 26 mmol/L (ref 22.0–30.0)
CREATININE: 0.89 mg/dL (ref 0.60–1.00)
EGFR CKD-EPI AA FEMALE: 81 mL/min/{1.73_m2} (ref >=60)
EGFR CKD-EPI NON-AA FEMALE: 71 mL/min/{1.73_m2} (ref >=60)
GLUCOSE RANDOM: 77 mg/dL (ref 65–179)
POTASSIUM: 4 mmol/L (ref 3.5–5.0)
PROTEIN TOTAL: 6.5 g/dL (ref 6.5–8.3)

## 2018-06-21 LAB — BILIRUBIN, DIRECT: BILIRUBIN DIRECT: 0.2 mg/dL (ref 0.00–0.40)

## 2018-06-21 LAB — MAGNESIUM: Magnesium:MCnc:Pt:Ser/Plas:Qn:: 2

## 2018-06-21 LAB — TACROLIMUS BLOOD: Lab: 7.1

## 2018-06-21 LAB — GAMMA GLUTAMYL TRANSFERASE: Gamma glutamyl transferase:CCnc:Pt:Ser/Plas:Qn:: 27

## 2018-06-21 LAB — LYMPHOCYTES RELATIVE PERCENT: Lab: 16.3

## 2018-06-21 LAB — CALCIUM: Calcium:MCnc:Pt:Ser/Plas:Qn:: 9.3

## 2018-06-21 LAB — BILIRUBIN DIRECT: Bilirubin.glucuronidated:MCnc:Pt:Ser/Plas:Qn:: 0.2

## 2018-06-21 LAB — PHOSPHORUS: Phosphate:MCnc:Pt:Ser/Plas:Qn:: 4.5

## 2018-06-21 NOTE — Unmapped (Signed)
[facility] Refill Coordination Note    Specialty Medication(s) to be Shipped:   Transplant: valgancyclovir 450mg  and Ursodiol 300mg   Other medication(s) to be shipped: Sulfa 400-80mg  & Prednisone 5mg  (Faxed md for refill)  **Note: Patient confirmed she is getting Myfortic & Prograf from [facility]**     Sara Sanders, DOB: October 18, 1955  Phone: [phone] (home)   Shipping Address: [address] [place] [address]    All above HIPAA information was verified with patient.     Completed refill call assessment today to schedule patient's medication shipment from the [facility] [phone]).       Specialty medication(s) and dose(s) confirmed: Regimen is correct and unchanged.   Changes to medications: Sara Sanders reports no changes reported at this time.  Changes to insurance: No  Questions for the pharmacist: No    The patient will receive a drug information handout for each medication shipped and additional FDA Medication Guides as required.      DISEASE/MEDICATION-SPECIFIC INFORMATION        N/A    ADHERENCE     Medication Adherence    Patient reported X missed doses in the last month:  0         MEDICARE PART B DOCUMENTATION     Not Applicable    SHIPPING     Shipping address confirmed in Epic.     Delivery Scheduled: Yes, Expected medication delivery date: 06/30/2018 via UPS or courier.     Sara Sanders   [facility] Specialty Technician

## 2018-06-22 MED ORDER — PROGRAF 1 MG CAPSULE
ORAL_CAPSULE | 11 refills | 0 days | Status: SS
Start: 2018-06-22 — End: 2018-08-06

## 2018-06-22 MED ORDER — PREDNISONE 10 MG TABLET
ORAL_TABLET | 1 refills | 0 days | Status: CP
Start: 2018-06-22 — End: 2018-06-24

## 2018-06-22 NOTE — Unmapped (Signed)
Patient's 9/9 tac level below goal again. Discussed with txp pharmacist, [name], who recommended she increase her dose to 8mg /7mg  daily. Spoke with patient and relayed change, directing her to take the extra 1mg  cap this morning and to change her pill box and orange card. She verbalized understanding.

## 2018-06-23 NOTE — Unmapped (Signed)
Intraoperative donor biopsy evaluated in pathology today by Dr. Shepard General in presence of Drs.Cleon Gustin and NP Renee Rival. Due to the fact that the slides were crushed in transit and the specimen was from a frozen sample, Dr.Woosley felt the quality was not optimal. He noted specimen showed some chronic/acute inflammation possibly d/t manipulation but showed minimal steatosis.Sara Sanders

## 2018-06-23 NOTE — Unmapped (Signed)
Contacted surgical pathology re.donor bx slides received from [facility]. They said the slides were crushed and were sent for repair, so they are not available for review in pathology today. Left VM for Mr.Stapleton from [facility], requesting that future slides be sent in padded envelope for protection.

## 2018-06-24 ENCOUNTER — Ambulatory Visit: Admit: 2018-06-24 | Discharge: 2018-06-24 | Payer: PRIVATE HEALTH INSURANCE

## 2018-06-24 ENCOUNTER — Institutional Professional Consult (permissible substitution): Admit: 2018-06-24 | Discharge: 2018-06-24 | Payer: PRIVATE HEALTH INSURANCE

## 2018-06-24 ENCOUNTER — Ambulatory Visit
Admit: 2018-06-24 | Discharge: 2018-06-24 | Payer: PRIVATE HEALTH INSURANCE | Attending: Student in an Organized Health Care Education/Training Program | Primary: Student in an Organized Health Care Education/Training Program

## 2018-06-24 DIAGNOSIS — Z79899 Other long term (current) drug therapy: Secondary | ICD-10-CM

## 2018-06-24 DIAGNOSIS — Z944 Liver transplant status: Principal | ICD-10-CM

## 2018-06-24 DIAGNOSIS — Z4823 Encounter for aftercare following liver transplant: Principal | ICD-10-CM

## 2018-06-24 LAB — CBC W/ AUTO DIFF
BASOPHILS ABSOLUTE COUNT: 0 10*9/L (ref 0.0–0.1)
BASOPHILS RELATIVE PERCENT: 0.9 %
EOSINOPHILS ABSOLUTE COUNT: 0.1 10*9/L (ref 0.0–0.4)
EOSINOPHILS RELATIVE PERCENT: 1.9 %
HEMATOCRIT: 36.4 % (ref 36.0–46.0)
HEMOGLOBIN: 12.2 g/dL — ABNORMAL LOW (ref 13.5–16.0)
LARGE UNSTAINED CELLS: 1 % (ref 0–4)
LYMPHOCYTES RELATIVE PERCENT: 21.9 %
MEAN CORPUSCULAR HEMOGLOBIN CONC: 33.5 g/dL (ref 31.0–37.0)
MEAN CORPUSCULAR HEMOGLOBIN: 33.5 pg (ref 26.0–34.0)
MEAN CORPUSCULAR VOLUME: 99.9 fL (ref 80.0–100.0)
MEAN PLATELET VOLUME: 7.3 fL (ref 7.0–10.0)
MONOCYTES ABSOLUTE COUNT: 0.2 10*9/L (ref 0.2–0.8)
MONOCYTES RELATIVE PERCENT: 3.6 %
NEUTROPHILS ABSOLUTE COUNT: 2.9 10*9/L (ref 2.0–7.5)
NEUTROPHILS RELATIVE PERCENT: 70.8 %
PLATELET COUNT: 163 10*9/L (ref 150–440)
RED BLOOD CELL COUNT: 3.65 10*12/L — ABNORMAL LOW (ref 4.00–5.20)
RED CELL DISTRIBUTION WIDTH: 16.6 % — ABNORMAL HIGH (ref 12.0–15.0)

## 2018-06-24 LAB — COMPREHENSIVE METABOLIC PANEL
ALBUMIN: 3.8 g/dL (ref 3.5–5.0)
ALKALINE PHOSPHATASE: 50 U/L (ref 38–126)
ALT (SGPT): 30 U/L (ref 15–48)
ANION GAP: 10 mmol/L (ref 9–15)
AST (SGOT): 13 U/L — ABNORMAL LOW (ref 14–38)
BILIRUBIN TOTAL: 0.7 mg/dL (ref 0.0–1.2)
BLOOD UREA NITROGEN: 21 mg/dL (ref 7–21)
BUN / CREAT RATIO: 23
CALCIUM: 9.1 mg/dL (ref 8.5–10.2)
CO2: 25 mmol/L (ref 22.0–30.0)
CREATININE: 0.92 mg/dL (ref 0.60–1.00)
EGFR CKD-EPI AA FEMALE: 78 mL/min/{1.73_m2} (ref >=60)
EGFR CKD-EPI NON-AA FEMALE: 68 mL/min/{1.73_m2} (ref >=60)
GLUCOSE RANDOM: 79 mg/dL (ref 65–179)
POTASSIUM: 3.8 mmol/L (ref 3.5–5.0)
PROTEIN TOTAL: 6.2 g/dL — ABNORMAL LOW (ref 6.5–8.3)
SODIUM: 139 mmol/L (ref 135–145)

## 2018-06-24 LAB — GAMMA GLUTAMYL TRANSFERASE: Gamma glutamyl transferase:CCnc:Pt:Ser/Plas:Qn:: 24

## 2018-06-24 LAB — BILIRUBIN DIRECT: Bilirubin.glucuronidated:MCnc:Pt:Ser/Plas:Qn:: 0.1

## 2018-06-24 LAB — PROTEIN TOTAL: Protein:MCnc:Pt:Ser/Plas:Qn:: 6.2 — ABNORMAL LOW

## 2018-06-24 LAB — PHOSPHORUS
PHOSPHORUS: 4.6 mg/dL (ref 2.9–4.7)
Phosphate:MCnc:Pt:Ser/Plas:Qn:: 4.6

## 2018-06-24 LAB — MAGNESIUM: Magnesium:MCnc:Pt:Ser/Plas:Qn:: 1.8

## 2018-06-24 LAB — TACROLIMUS BLOOD: Lab: 5.5

## 2018-06-24 LAB — LARGE UNSTAINED CELLS: Lab: 1

## 2018-06-24 MED ORDER — PREDNISONE 10 MG TABLET
ORAL_TABLET | 1 refills | 0 days | Status: CP
Start: 2018-06-24 — End: 2018-07-08
  Filled 2018-06-29: qty 60, 14d supply, fill #0

## 2018-06-24 NOTE — Unmapped (Signed)
TRANSPLANT SURGERY PROGRESS NOTE    Assessment and Plan    Sara Sanders is a 62 y.o. 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 Her course since surgery has been complicated by rheumatoid arthritis flare, maintained on Prednisone 20mg  daily, back pain, and C. Diff colitis s/p therapy.     She is clinically doing well. Labs reveal stable CBC and LFTs. Tacrolimus trough is pending.     - Plan to taper Prednisone slowly per pharmacy as rheumatoid arthritis flare is resolved- goal to taper to 5mg   - Further medication change per pharmacy  - Remaining staples removed  - Continue immunosuppression with Prograf, goal trough 8-10, and Myfortic   - Return to clinic for 3 month post-op follow-up      Subjective  Sara Sanders is a 62 y.o. 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 Her course since surgery has been complicated by rheumatoid arthritis flare, maintained on Prednisone 20mg  daily, back pain, and C. Diff colitis s/p therapy. Since last seen 2 weeks ago, she reports feeling much better overall. She is requiring very little pain medication for back pain, using Tramadol or Tylenol prn, not everyday. She reports eating and drinking well. She is up ambulating often. She denies any arthritis pain. She reports normal BM. She is taking all medications as prescribed without reported side effects.        Objective    Vitals:    06/24/18 1222   BP: 123/60   Pulse: 85   Temp: 36.9 ??C (98.4 ??F)   TempSrc: Tympanic   Weight: 49 kg (108 lb)   Height: 149.9 cm (4' 11.02)      Body mass index is 21.8 kg/m??.     Physical Exam:    General Appearance:   Welll appearing F in NAD.  Lungs:                          Normal work of breathing on room air.  Heart:                           Regular rate and rhythm.  Abdomen:                    Soft, non-distended, non-tender to palpation. Incision healing well with half staples removed- remaining removed today.  Extremities:                 No clubbing, cyanosis, or edema.  ??    Data Review:  Lab Results   Component Value Date    WBC 4.2 (L) 06/24/2018    HGB 12.2 (L) 06/24/2018    HCT 36.4 06/24/2018    PLT 163 06/24/2018       Lab Results   Component Value Date    NA 139 06/24/2018    K 3.8 06/24/2018    CL 104 06/24/2018    CO2 25.0 06/24/2018    BUN 21 06/24/2018    CREATININE 0.92 06/24/2018    GLU 79 06/24/2018    CALCIUM 9.1 06/24/2018    MG 1.8 06/24/2018    PHOS 4.6 06/24/2018  Lab Results   Component Value Date    BILITOT 0.7 06/24/2018    BILIDIR 0.10 06/24/2018    PROT 6.2 (L) 06/24/2018    ALBUMIN 3.8 06/24/2018    ALT 30 06/24/2018    AST 13 (L) 06/24/2018    ALKPHOS 50 06/24/2018    GGT 24 06/24/2018       Lab Results   Component Value Date    LABPROT 16.2 (H) 03/17/2018    INR 1.09 05/02/2018    APTT 25.5 (L) 04/29/2018         Imaging:  No new imaging

## 2018-06-24 NOTE — Unmapped (Signed)
[facility] PHARMACY NOTE  06/24/2018   Sara Sanders  [id number]    Medication changes today:   1. Decrease prednisone to 15 mg daily x7 days then 10 mg x7 days then 5 mg indefinitely     Education/Adherence tools provided today:  1.provided additional education on immunosuppression and transplant related medications including reviewing indications of medications, dosing and side effects     Follow up items:  1. goal of understanding indications and dosing of immunosuppression medications   2. Assess tolerability of prednisone taper  3. Consider transitioning to azathioprine if has further RA flares  4. Consider decreasing omeprazole to once daily once prednisone dose back to 5 mg     Next visit with pharmacy in 1-3 months  ____________________________________________________________________    Sara Sanders is a 62 y.o. 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     Other PMH significant for ascites, esophageal varices, hypothyroidism, RA, HTN  Seen by pharmacy today for: medication management; last seen by pharmacy 2 weeks ago    CC: Patient had no complaints today other than some back pain that has improved since last visit    Vitals:    06/24/18 1220   BP: 123/60   Pulse: 85   Temp: 36.9 ??C (98.4 ??F)       No Known Allergies    All medications reviewed and updated.     Medication list includes revisions made during today???s encounter    Outpatient Encounter Medications as of 06/24/2018   Medication Sig Dispense Refill   ??? acetaminophen (TYLENOL) 325 MG tablet TAKE 2 TABLETS BY MOUTH EVERY 4 HOURS AS NEEDED FOR PAIN. MAX 9 TABLETS (3000 MG) PER DAY. 100 each 2   ??? aspirin (ECOTRIN) 81 MG tablet TAKE 1 TABLET BY MOUTH ONCE DAILY 120 tablet 11   ??? calcium carbonate-vitamin D3 600 mg(1,500mg ) -200 unit per tablet Take 1 tablet by mouth daily.      ??? golimumab (SIMPONI) 50 mg/0.5 mL PnIj pen injector HOLD (Patient not taking: Reported on 06/02/2018)  0   ??? levothyroxine (SYNTHROID) 75 MCG tablet Take 1 tablet (75 mcg total) by mouth daily. 30 tablet 11   ??? magnesium oxide (MAG-OX) 400 mg (241.3 mg magnesium) tablet Take 1 tablet (400 mg total) by mouth daily. 60 tablet 11   ??? mycophenolate (MYFORTIC) 180 MG EC tablet Take 2 tablets (360 mg total) by mouth Two (2) times a day. 180 tablet 11   ??? omeprazole (PRILOSEC) 40 MG capsule TAKE 1 CAPSULE BY MOUTH TWICE DAILY 60 capsule 0   ??? predniSONE (DELTASONE) 10 MG tablet TAKE 2 TABLETS (20 MG) BY MOUTH DAILY 60 tablet 1   ??? PROGRAF 1 mg capsule Take 8 capsules (8mg ) by mouth in the morning and take 7 capsules (7mg ) in the evening 30 capsule 11   ??? sulfamethoxazole-trimethoprim (BACTRIM,SEPTRA) 400-80 mg per tablet TAKE 1 TABLET BY MOUTH 3 TIMES WEEKLY 12 each 5   ??? traMADol (ULTRAM) 50 mg tablet TAKE 1 TABLET BY MOUTH TWICE DAILY AS NEEDED FOR PAIN 60 tablet 1   ??? ursodiol (ACTIGALL) 300 mg capsule Take 1 capsule (300 mg total) by mouth Two (2) times a day. 60 each 11   ??? valGANciclovir (VALCYTE) 450 mg tablet TAKE 1 TABLET BY MOUTH ONCE DAILY 30 tablet 2     No facility-administered encounter medications on file as of 06/24/2018.      Induction agent : steroids only  CURRENT IMMUNOSUPPRESSION: tacrolimus 8 mg qAM and 7 mg qPM (increased 9/10) prograf/cyclosporine goal: 8-10   myfortic360  mg PO bid    prednisone 20mg  once daily  - Patient's PMH includes rheumatoid arthritis and was previously taking golimumab. Golimumab was stopped few months prior to transplant d/t concern for infection. Patient experienced significant RA flare up post transplant and prednisone dose was increased.  Patient reports that the flare up has resolved now, but her RA-associated wrist pain has not gotten any better    Patient is tolerating immunosuppression well    IMMUNOSUPPRESSION DRUG LEVELS:  Lab Results   Component Value Date    TACROLIMUS 7.1 06/21/2018    TACROLIMUS 6.6 06/17/2018    TACROLIMUS 8.4 06/15/2018    TACROLIMUS 9.3 05/28/2018 No results found for: CYCLO  No results found for: EVEROLIMUS  No results found for: SIROLIMUS    Prograf level is accurate 12 hour trough     Graft function: stable   DSA: ntd  Biopsies to date:   ?? 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 Patient reported that it was working really well for her.   Plan: As tacrolimus dose was just adjusted on 9/10 will continue current tacrolimus dose.  Will consider the possibility to switch the patient to azathioprine for both immunosuppression and control of RA if she does not tolerate steroid wean. Will need to obtain TPMT level. In the setting of liver transplant, we would need to genotype donor blood to detect a true polymorphism.     ID prophylaxis:   CMV Status: D-/ R+, moderate risk. CMV prophylaxis: valganciclovir 450 mg daily x 3 months per protocol.  No results found for: CMVCP  PCP Prophylaxis: bactrim SS 1 tab MWF x 6 months.  Thrush:  completed in hospital  Patient is  tolerating infectious prophylaxis well.  Plan: Continue per protocol. Continue to monitor.    CV Prophylaxis: asa 81 mg   The 10-year ASCVD risk score Sara George DC Jr., et al., 2013) is: 3.4%  Statin therapy: Not indicated  Plan: Continue to monitor     BP: Goal < 140/90. Clinic vitals reported above  Home BP ranges: not currently checking  Current meds include: none  Plan: Continue to monitor    Anemia:  H/H:   Lab Results   Component Value Date    HGB 12.2 (L) 06/24/2018     Lab Results   Component Value Date    HCT 36.4 06/24/2018     Iron panel:  Lab Results   Component Value Date    IRON 82 12/08/2017    TIBC  12/08/2017      Comment:      Unable to calculate.      FERRITIN 539.0 (H) 12/08/2017     Lab Results   Component Value Date    LABIRON  12/08/2017      Comment:      Unable to calculate.       Prior ESA use: none  Plan: Continue to monitor.     DM:   Lab Results   Component Value Date    A1C 4.2 (L) 04/27/2018   . Goal A1c < 7  History of Dm? No  Established with endocrinologist/PCP for BG managment? No  Hypoglycemia: no  Plan:  Continue to monitor.    Electrolytes: wnl   Meds currently on: magnesium oxide 400mg  once daily  Plan: Continue current therapy. Continue to monitor     GI/BM: Pt reports 1-2BM daily with occasional food related diarrhea   Meds currently on: omeprazole 40 mg BID (has been taking for several years and can tell when she misses a dose)   Plan: Consider decreasing omeprazole to daily once prednisone dose is at baseline. Continue to monitor    Pain: pt reports mild back pain that has much improved. Wrist pain from RA flare has resolved   Meds currently on: acetaminophen 650mg  Q8H PRN (uses a few days per week) , tramadol 50 PRN (uses 1-2 times per week)  Plan: Continue to monitor    Bone health:   Vitamin D Level: 30.2 on 06/10/18. Goal > 30. Patient reports having a recent bone scan at her rheumatologist appointment and that her rheumatologist would like to start her on Forteo injections if transplant team approves  Last DEXA results: 07/14/2017:   - The bone mineral density in the spine measuring L1, L3 and L4 measures 0.976 gm/cm2. ??The ??Z score is 0.7 and the T score is -0.7.  - The total bone mineral density in the proximal left femur measures 0.758 gm/cm2. ??The Z score is -0.6 and the T score is -1.5.  Meds currently on: Calcium +D 1 tablet daily   Plan: Consider starting Vitamin D at next visit.  Approved of Forteo for use.     Women's/Men's Health:  ASHLIE MCMENAMY is a 62 y.o. Female perimenopausal. Patient reports no men's/women's health issues  Plan: Continue to monitor    Other: RA - defer to local rheumatology for treatment plan. Pt has been on golimumab, but d/t increased risk of infection at this time, we will not be restarting it.  Flares were initially being managed by increased prednisone and myfortic + tramadol and tylenol post-transplant. Myfortic dose decreased in the setting of C. Diff infection at last visit.     Received hydrocortisone injection in wrist on 9/3 at Rheum appointment  Plan: defer to rheumatologist for now. May consider resuming Simponi after 3 months.     Adherence: Patient has average understanding of medications; was able to independently identify names/doses of immunosuppressants and OI meds.  Patient  does fill their own pill box on a regular basis at home. - husband fills med box and is primary caretaker of medications  Patient brought medication card:yes  Pill box:did not bring  Plan: provided basic adherence counseling/intervention    Spent approximately 30 minutes on educating this patient and greater than 50% was spent in direct face to face counseling regarding post transplant medication education. Questions and concerns were address to patient's satisfaction.    Patient was reviewed with Dr. Matilde Haymaker who was agreement with the stated plan:     During this visit, the following was completed:   BP log data assessment  Labs ordered and evaluated  complex treatment plan >1 DS   Patient education was completed for 11-24 minutes     All questions/concerns were addressed to the patient's satisfaction.  __________________________________________    PATIENT SEEN AND EVALUATED BY:  [name], PHARMD  [employer] [facility]  PAGER [phone]

## 2018-06-25 NOTE — Unmapped (Addendum)
Patient left VM for coordinator to inquire when her drain site stitches were to be removed, saying only her staples were removed at her clnic appt yesterday. Per PA Vonderau, they can be removed at patient's 3 mos visit, or sooner if she prefers.Spoke with patient and relayed PA's comments, directing her to continue to keep area clean to prevent skin overgrowth. Instructed patient that if it begins to appear red/irritated to contact coordinator to have them removed sooner. She verbalized understanding.

## 2018-06-25 NOTE — Unmapped (Signed)
Patient seen in clinic today with her husband for f/u on her wound. She looks very well today. She denies any issues with n/v/d/constipation or fever/chills or night sweats. She says she is having no issues with swelling and no visible puffiness at ankles. She had staples removed at incision site, area is no longer draining and is without redness. Since her rheumatoid flare has improved so much, she was told by PA Vonderau to reduce her prednisone to 15mg  daily for the next week. She will then reduce it weekly by 5 mg and remain on 5mg  from 9/26, with directive to contact this coordinator if she begins having a flare again. She opted to remain with her current local rheumatologist, Dr.Kernodle, d/t her comfort level with him. She completed a bonescan on 9/3 and plans to start her on Forteo, which was approved by txp pharmacist, [name]. Patient to return to clinic for her 3 mos f/u in mid-October.

## 2018-06-28 ENCOUNTER — Ambulatory Visit: Admit: 2018-06-28 | Discharge: 2018-06-29 | Payer: PRIVATE HEALTH INSURANCE

## 2018-06-28 DIAGNOSIS — Z944 Liver transplant status: Principal | ICD-10-CM

## 2018-06-28 DIAGNOSIS — Z79899 Other long term (current) drug therapy: Secondary | ICD-10-CM

## 2018-06-28 LAB — COMPREHENSIVE METABOLIC PANEL
ALBUMIN: 3.9 g/dL (ref 3.5–5.0)
ALKALINE PHOSPHATASE: 66 U/L (ref 38–126)
ALT (SGPT): 24 U/L (ref 15–48)
ANION GAP: 9 mmol/L (ref 9–15)
AST (SGOT): 13 U/L — ABNORMAL LOW (ref 14–38)
BILIRUBIN TOTAL: 0.6 mg/dL (ref 0.0–1.2)
BLOOD UREA NITROGEN: 20 mg/dL (ref 7–21)
BUN / CREAT RATIO: 21
CALCIUM: 9.2 mg/dL (ref 8.5–10.2)
CHLORIDE: 102 mmol/L (ref 98–107)
CO2: 29 mmol/L (ref 22.0–30.0)
CREATININE: 0.94 mg/dL (ref 0.60–1.00)
EGFR CKD-EPI AA FEMALE: 76 mL/min/{1.73_m2} (ref >=60)
EGFR CKD-EPI NON-AA FEMALE: 66 mL/min/{1.73_m2} (ref >=60)
GLUCOSE RANDOM: 73 mg/dL (ref 65–179)
PROTEIN TOTAL: 6.4 g/dL — ABNORMAL LOW (ref 6.5–8.3)
SODIUM: 140 mmol/L (ref 135–145)

## 2018-06-28 LAB — CBC W/ AUTO DIFF
BASOPHILS ABSOLUTE COUNT: 0 10*9/L (ref 0.0–0.1)
BASOPHILS RELATIVE PERCENT: 0.9 %
EOSINOPHILS ABSOLUTE COUNT: 0.1 10*9/L (ref 0.0–0.4)
EOSINOPHILS RELATIVE PERCENT: 1.2 %
HEMATOCRIT: 39.1 % (ref 36.0–46.0)
HEMOGLOBIN: 12.9 g/dL — ABNORMAL LOW (ref 13.5–16.0)
LARGE UNSTAINED CELLS: 1 % (ref 0–4)
LYMPHOCYTES ABSOLUTE COUNT: 1 10*9/L — ABNORMAL LOW (ref 1.5–5.0)
LYMPHOCYTES RELATIVE PERCENT: 21.1 %
MEAN CORPUSCULAR HEMOGLOBIN CONC: 33 g/dL (ref 31.0–37.0)
MEAN CORPUSCULAR HEMOGLOBIN: 33.2 pg (ref 26.0–34.0)
MEAN CORPUSCULAR VOLUME: 100.5 fL — ABNORMAL HIGH (ref 80.0–100.0)
MEAN PLATELET VOLUME: 6.9 fL — ABNORMAL LOW (ref 7.0–10.0)
MONOCYTES ABSOLUTE COUNT: 0.2 10*9/L (ref 0.2–0.8)
MONOCYTES RELATIVE PERCENT: 4.7 %
NEUTROPHILS ABSOLUTE COUNT: 3.3 10*9/L (ref 2.0–7.5)
NEUTROPHILS RELATIVE PERCENT: 71.3 %
PLATELET COUNT: 159 10*9/L (ref 150–440)
RED CELL DISTRIBUTION WIDTH: 16.5 % — ABNORMAL HIGH (ref 12.0–15.0)

## 2018-06-28 LAB — MAGNESIUM: Magnesium:MCnc:Pt:Ser/Plas:Qn:: 1.9

## 2018-06-28 LAB — EOSINOPHILS RELATIVE PERCENT: Lab: 1.2

## 2018-06-28 LAB — PHOSPHORUS: Phosphate:MCnc:Pt:Ser/Plas:Qn:: 4.8 — ABNORMAL HIGH

## 2018-06-28 LAB — GAMMA GLUTAMYL TRANSFERASE: Gamma glutamyl transferase:CCnc:Pt:Ser/Plas:Qn:: 22

## 2018-06-28 LAB — TACROLIMUS BLOOD: Lab: 10

## 2018-06-28 LAB — BILIRUBIN DIRECT: Bilirubin.glucuronidated:MCnc:Pt:Ser/Plas:Qn:: 0.1

## 2018-06-28 LAB — ALKALINE PHOSPHATASE: Alkaline phosphatase:CCnc:Pt:Ser/Plas:Qn:: 66

## 2018-06-29 MED FILL — SULFAMETHOXAZOLE 400 MG-TRIMETHOPRIM 80 MG TABLET: ORAL | 28 days supply | Qty: 12 | Fill #1

## 2018-06-29 MED FILL — PREDNISONE 10 MG TABLET: 14 days supply | Qty: 60 | Fill #0 | Status: AC

## 2018-06-29 MED FILL — URSODIOL 300 MG CAPSULE: 30 days supply | Qty: 60 | Fill #1 | Status: AC

## 2018-06-29 MED FILL — VALGANCICLOVIR 450 MG TABLET: 30 days supply | Qty: 30 | Fill #1 | Status: AC

## 2018-06-29 MED FILL — SULFAMETHOXAZOLE 400 MG-TRIMETHOPRIM 80 MG TABLET: 28 days supply | Qty: 12 | Fill #1 | Status: AC

## 2018-06-29 MED FILL — URSODIOL 300 MG CAPSULE: ORAL | 30 days supply | Qty: 60 | Fill #1

## 2018-06-29 MED FILL — VALGANCICLOVIR 450 MG TABLET: ORAL | 30 days supply | Qty: 30 | Fill #1

## 2018-07-01 ENCOUNTER — Ambulatory Visit: Admit: 2018-07-01 | Discharge: 2018-07-02 | Payer: PRIVATE HEALTH INSURANCE

## 2018-07-01 DIAGNOSIS — Z944 Liver transplant status: Principal | ICD-10-CM

## 2018-07-01 DIAGNOSIS — Z79899 Other long term (current) drug therapy: Secondary | ICD-10-CM

## 2018-07-01 LAB — CBC W/ AUTO DIFF
BASOPHILS RELATIVE PERCENT: 1 %
EOSINOPHILS ABSOLUTE COUNT: 0.1 10*9/L (ref 0.0–0.4)
EOSINOPHILS RELATIVE PERCENT: 2.5 %
HEMOGLOBIN: 12.5 g/dL — ABNORMAL LOW (ref 13.5–16.0)
LARGE UNSTAINED CELLS: 1 % (ref 0–4)
LYMPHOCYTES ABSOLUTE COUNT: 1 10*9/L — ABNORMAL LOW (ref 1.5–5.0)
LYMPHOCYTES RELATIVE PERCENT: 22 %
MEAN CORPUSCULAR HEMOGLOBIN CONC: 33.1 g/dL (ref 31.0–37.0)
MEAN CORPUSCULAR HEMOGLOBIN: 33.1 pg (ref 26.0–34.0)
MEAN CORPUSCULAR VOLUME: 100 fL (ref 80.0–100.0)
MONOCYTES ABSOLUTE COUNT: 0.2 10*9/L (ref 0.2–0.8)
MONOCYTES RELATIVE PERCENT: 4.8 %
NEUTROPHILS RELATIVE PERCENT: 68.6 %
PLATELET COUNT: 157 10*9/L (ref 150–440)
RED BLOOD CELL COUNT: 3.77 10*12/L — ABNORMAL LOW (ref 4.00–5.20)
RED CELL DISTRIBUTION WIDTH: 16.2 % — ABNORMAL HIGH (ref 12.0–15.0)
WBC ADJUSTED: 4.7 10*9/L (ref 4.5–11.0)

## 2018-07-01 LAB — COMPREHENSIVE METABOLIC PANEL
ALBUMIN: 3.9 g/dL (ref 3.5–5.0)
ALKALINE PHOSPHATASE: 70 U/L (ref 38–126)
ALT (SGPT): 24 U/L (ref 15–48)
ANION GAP: 12 mmol/L (ref 9–15)
AST (SGOT): 15 U/L (ref 14–38)
BILIRUBIN TOTAL: 0.6 mg/dL (ref 0.0–1.2)
BLOOD UREA NITROGEN: 24 mg/dL — ABNORMAL HIGH (ref 7–21)
BUN / CREAT RATIO: 24
CALCIUM: 8.9 mg/dL (ref 8.5–10.2)
CHLORIDE: 101 mmol/L (ref 98–107)
CO2: 25 mmol/L (ref 22.0–30.0)
CREATININE: 0.99 mg/dL (ref 0.60–1.00)
EGFR CKD-EPI AA FEMALE: 72 mL/min/{1.73_m2} (ref >=60)
EGFR CKD-EPI NON-AA FEMALE: 62 mL/min/{1.73_m2} (ref >=60)
GLUCOSE RANDOM: 76 mg/dL (ref 65–179)
PROTEIN TOTAL: 6.3 g/dL — ABNORMAL LOW (ref 6.5–8.3)
SODIUM: 138 mmol/L (ref 135–145)

## 2018-07-01 LAB — MEAN CORPUSCULAR HEMOGLOBIN CONC: Lab: 33.1

## 2018-07-01 LAB — PHOSPHORUS: Phosphate:MCnc:Pt:Ser/Plas:Qn:: 4.9 — ABNORMAL HIGH

## 2018-07-01 LAB — MAGNESIUM: Magnesium:MCnc:Pt:Ser/Plas:Qn:: 1.8

## 2018-07-01 LAB — CO2: Carbon dioxide:SCnc:Pt:Ser/Plas:Qn:: 25

## 2018-07-01 LAB — TACROLIMUS BLOOD: Lab: 8.2

## 2018-07-01 LAB — GAMMA GLUTAMYL TRANSFERASE: Gamma glutamyl transferase:CCnc:Pt:Ser/Plas:Qn:: 22

## 2018-07-01 LAB — BILIRUBIN DIRECT: Bilirubin.glucuronidated:MCnc:Pt:Ser/Plas:Qn:: <0.1

## 2018-07-05 ENCOUNTER — Ambulatory Visit: Admit: 2018-07-05 | Discharge: 2018-07-06 | Payer: PRIVATE HEALTH INSURANCE

## 2018-07-05 DIAGNOSIS — Z79899 Other long term (current) drug therapy: Secondary | ICD-10-CM

## 2018-07-05 DIAGNOSIS — Z944 Liver transplant status: Principal | ICD-10-CM

## 2018-07-05 LAB — CBC W/ AUTO DIFF
BASOPHILS ABSOLUTE COUNT: 0 10*9/L (ref 0.0–0.1)
BASOPHILS RELATIVE PERCENT: 1.2 %
EOSINOPHILS ABSOLUTE COUNT: 0.1 10*9/L (ref 0.0–0.4)
EOSINOPHILS RELATIVE PERCENT: 2 %
HEMOGLOBIN: 12.8 g/dL — ABNORMAL LOW (ref 13.5–16.0)
LARGE UNSTAINED CELLS: 1 % (ref 0–4)
LYMPHOCYTES ABSOLUTE COUNT: 0.8 10*9/L — ABNORMAL LOW (ref 1.5–5.0)
LYMPHOCYTES RELATIVE PERCENT: 22.8 %
MEAN CORPUSCULAR HEMOGLOBIN CONC: 32.6 g/dL (ref 31.0–37.0)
MEAN CORPUSCULAR HEMOGLOBIN: 32.4 pg (ref 26.0–34.0)
MEAN CORPUSCULAR VOLUME: 99.6 fL (ref 80.0–100.0)
MONOCYTES ABSOLUTE COUNT: 0.2 10*9/L (ref 0.2–0.8)
MONOCYTES RELATIVE PERCENT: 5.2 %
NEUTROPHILS ABSOLUTE COUNT: 2.5 10*9/L (ref 2.0–7.5)
NEUTROPHILS RELATIVE PERCENT: 67.9 %
RED BLOOD CELL COUNT: 3.95 10*12/L — ABNORMAL LOW (ref 4.00–5.20)
RED CELL DISTRIBUTION WIDTH: 15.8 % — ABNORMAL HIGH (ref 12.0–15.0)
WBC ADJUSTED: 3.6 10*9/L — ABNORMAL LOW (ref 4.5–11.0)

## 2018-07-05 LAB — PROTEIN TOTAL: Protein:MCnc:Pt:Ser/Plas:Qn:: 6.7

## 2018-07-05 LAB — COMPREHENSIVE METABOLIC PANEL
ALBUMIN: 4.1 g/dL (ref 3.5–5.0)
ALKALINE PHOSPHATASE: 73 U/L (ref 38–126)
ALT (SGPT): 52 U/L — ABNORMAL HIGH (ref 15–48)
ANION GAP: 9 mmol/L (ref 9–15)
AST (SGOT): 22 U/L (ref 14–38)
BILIRUBIN TOTAL: 0.7 mg/dL (ref 0.0–1.2)
BLOOD UREA NITROGEN: 18 mg/dL (ref 7–21)
BUN / CREAT RATIO: 18
CALCIUM: 9.7 mg/dL (ref 8.5–10.2)
CHLORIDE: 101 mmol/L (ref 98–107)
CO2: 30 mmol/L (ref 22.0–30.0)
EGFR CKD-EPI AA FEMALE: 70 mL/min/{1.73_m2} (ref >=60)
EGFR CKD-EPI NON-AA FEMALE: 61 mL/min/{1.73_m2} (ref >=60)
GLUCOSE RANDOM: 73 mg/dL (ref 65–179)
PROTEIN TOTAL: 6.7 g/dL (ref 6.5–8.3)
SODIUM: 140 mmol/L (ref 135–145)

## 2018-07-05 LAB — GAMMA GLUTAMYL TRANSFERASE: Gamma glutamyl transferase:CCnc:Pt:Ser/Plas:Qn:: 28

## 2018-07-05 LAB — MEAN PLATELET VOLUME: Lab: 7.1

## 2018-07-05 LAB — TACROLIMUS BLOOD: Lab: 9.4

## 2018-07-05 LAB — BILIRUBIN DIRECT: Bilirubin.glucuronidated:MCnc:Pt:Ser/Plas:Qn:: 0.1

## 2018-07-05 LAB — MAGNESIUM: Magnesium:MCnc:Pt:Ser/Plas:Qn:: 1.9

## 2018-07-05 LAB — PHOSPHORUS: Phosphate:MCnc:Pt:Ser/Plas:Qn:: 5.3 — ABNORMAL HIGH

## 2018-07-06 NOTE — Unmapped (Addendum)
Patient with bump in ALT with 9/23 lab results.Spoke to her but she denied any sx of illness or starting new meds. Notified PA Shurney.

## 2018-07-08 ENCOUNTER — Ambulatory Visit: Admit: 2018-07-08 | Discharge: 2018-07-09 | Payer: PRIVATE HEALTH INSURANCE

## 2018-07-08 DIAGNOSIS — Z79899 Other long term (current) drug therapy: Secondary | ICD-10-CM

## 2018-07-08 DIAGNOSIS — Z944 Liver transplant status: Principal | ICD-10-CM

## 2018-07-08 LAB — CBC W/ AUTO DIFF
BASOPHILS ABSOLUTE COUNT: 0.1 10*9/L (ref 0.0–0.1)
BASOPHILS RELATIVE PERCENT: 1.3 %
EOSINOPHILS ABSOLUTE COUNT: 0.1 10*9/L (ref 0.0–0.4)
EOSINOPHILS RELATIVE PERCENT: 1.8 %
HEMATOCRIT: 38.4 % (ref 36.0–46.0)
HEMOGLOBIN: 12.8 g/dL — ABNORMAL LOW (ref 13.5–16.0)
LARGE UNSTAINED CELLS: 2 % (ref 0–4)
LYMPHOCYTES ABSOLUTE COUNT: 1 10*9/L — ABNORMAL LOW (ref 1.5–5.0)
LYMPHOCYTES RELATIVE PERCENT: 27.2 %
MEAN CORPUSCULAR HEMOGLOBIN: 32.9 pg (ref 26.0–34.0)
MEAN CORPUSCULAR VOLUME: 98.9 fL (ref 80.0–100.0)
MEAN PLATELET VOLUME: 7.5 fL (ref 7.0–10.0)
MONOCYTES ABSOLUTE COUNT: 0.1 10*9/L — ABNORMAL LOW (ref 0.2–0.8)
MONOCYTES RELATIVE PERCENT: 3.5 %
NEUTROPHILS ABSOLUTE COUNT: 2.4 10*9/L (ref 2.0–7.5)
PLATELET COUNT: 146 10*9/L — ABNORMAL LOW (ref 150–440)
RED BLOOD CELL COUNT: 3.89 10*12/L — ABNORMAL LOW (ref 4.00–5.20)
RED CELL DISTRIBUTION WIDTH: 15.5 % — ABNORMAL HIGH (ref 12.0–15.0)
WBC ADJUSTED: 3.7 10*9/L — ABNORMAL LOW (ref 4.5–11.0)

## 2018-07-08 LAB — COMPREHENSIVE METABOLIC PANEL
ALBUMIN: 4 g/dL (ref 3.5–5.0)
ALKALINE PHOSPHATASE: 71 U/L (ref 38–126)
ANION GAP: 10 mmol/L (ref 7–15)
AST (SGOT): 25 U/L (ref 14–38)
BILIRUBIN TOTAL: 0.5 mg/dL (ref 0.0–1.2)
BLOOD UREA NITROGEN: 27 mg/dL — ABNORMAL HIGH (ref 7–21)
BUN / CREAT RATIO: 21
CALCIUM: 9.2 mg/dL (ref 8.5–10.2)
CHLORIDE: 103 mmol/L (ref 98–107)
CO2: 25 mmol/L (ref 22.0–30.0)
CREATININE: 1.29 mg/dL — ABNORMAL HIGH (ref 0.60–1.00)
EGFR CKD-EPI AA FEMALE: 52 mL/min/{1.73_m2} — ABNORMAL LOW (ref >=60)
EGFR CKD-EPI NON-AA FEMALE: 45 mL/min/{1.73_m2} — ABNORMAL LOW (ref >=60)
GLUCOSE RANDOM: 71 mg/dL (ref 65–179)
POTASSIUM: 4.3 mmol/L (ref 3.5–5.0)
SODIUM: 138 mmol/L (ref 135–145)

## 2018-07-08 LAB — TACROLIMUS BLOOD: Lab: 7.6

## 2018-07-08 LAB — PHOSPHORUS: Phosphate:MCnc:Pt:Ser/Plas:Qn:: 5.7 — ABNORMAL HIGH

## 2018-07-08 LAB — BUN / CREAT RATIO: Urea nitrogen/Creatinine:MRto:Pt:Ser/Plas:Qn:: 21

## 2018-07-08 LAB — MAGNESIUM: Magnesium:MCnc:Pt:Ser/Plas:Qn:: 2

## 2018-07-08 LAB — BILIRUBIN DIRECT: Bilirubin.glucuronidated:MCnc:Pt:Ser/Plas:Qn:: 0.1

## 2018-07-08 LAB — GAMMA GLUTAMYL TRANSFERASE: Gamma glutamyl transferase:CCnc:Pt:Ser/Plas:Qn:: 44

## 2018-07-08 LAB — EOSINOPHILS ABSOLUTE COUNT: Lab: 0.1

## 2018-07-08 MED ORDER — PREDNISONE 10 MG TABLET
ORAL_TABLET | Freq: Every day | ORAL | 11 refills | 0 days | Status: CP
Start: 2018-07-08 — End: 2018-07-15

## 2018-07-08 NOTE — Unmapped (Addendum)
Reviewed patient's 9/26 labs (tac level pending) with Gertie Fey, NP and Renelda Loma, PA - specifically rising ALT into 70s with plans per pred taper dosing to reduce from 10.mg to 5mg  tomorrow.  Per Amil Amen given rise in ALT patient to remain on pred 10.mg qd - patient verbalized understanding and was instructed to update orange card to reflect she will remain on 10.mg qd pred dosing with plans to repeat labs again next week to follow lfts.      Creatinine elevated to 1.29 - patient notes intake of ~64oz of water daily - encouraged increased intake of 80-100oz water daily - she verbalized agreement.

## 2018-07-09 NOTE — Unmapped (Signed)
Reviewed patient's 9/26 tac level of 7.6 (goal 8-10) with Gertie Fey, NP considering uptrending lfts and plans to remain on pred 10.mg qd.  Per Amil Amen no changes a this time - will follow repeat labs expected early next week.

## 2018-07-12 ENCOUNTER — Ambulatory Visit: Admit: 2018-07-12 | Discharge: 2018-07-13 | Payer: PRIVATE HEALTH INSURANCE

## 2018-07-12 DIAGNOSIS — Z944 Liver transplant status: Principal | ICD-10-CM

## 2018-07-12 DIAGNOSIS — Z79899 Other long term (current) drug therapy: Secondary | ICD-10-CM

## 2018-07-12 LAB — CBC W/ AUTO DIFF
BASOPHILS ABSOLUTE COUNT: 0 10*9/L (ref 0.0–0.1)
BASOPHILS RELATIVE PERCENT: 1.1 %
EOSINOPHILS ABSOLUTE COUNT: 0.1 10*9/L (ref 0.0–0.4)
HEMATOCRIT: 39.5 % (ref 36.0–46.0)
HEMOGLOBIN: 13.1 g/dL — ABNORMAL LOW (ref 13.5–16.0)
LARGE UNSTAINED CELLS: 2 % (ref 0–4)
LYMPHOCYTES ABSOLUTE COUNT: 1 10*9/L — ABNORMAL LOW (ref 1.5–5.0)
LYMPHOCYTES RELATIVE PERCENT: 27.1 %
MEAN CORPUSCULAR HEMOGLOBIN CONC: 33.3 g/dL (ref 31.0–37.0)
MEAN CORPUSCULAR HEMOGLOBIN: 32.8 pg (ref 26.0–34.0)
MEAN CORPUSCULAR VOLUME: 98.4 fL (ref 80.0–100.0)
MEAN PLATELET VOLUME: 7.4 fL (ref 7.0–10.0)
MONOCYTES ABSOLUTE COUNT: 0.2 10*9/L (ref 0.2–0.8)
MONOCYTES RELATIVE PERCENT: 4.2 %
NEUTROPHILS ABSOLUTE COUNT: 2.4 10*9/L (ref 2.0–7.5)
RED BLOOD CELL COUNT: 4.01 10*12/L (ref 4.00–5.20)
RED CELL DISTRIBUTION WIDTH: 15.3 % — ABNORMAL HIGH (ref 12.0–15.0)
WBC ADJUSTED: 3.7 10*9/L — ABNORMAL LOW (ref 4.5–11.0)

## 2018-07-12 LAB — COMPREHENSIVE METABOLIC PANEL
ALBUMIN: 4 g/dL (ref 3.5–5.0)
ALKALINE PHOSPHATASE: 69 U/L (ref 38–126)
ANION GAP: 10 mmol/L (ref 7–15)
AST (SGOT): 43 U/L — ABNORMAL HIGH (ref 14–38)
BILIRUBIN TOTAL: 0.6 mg/dL (ref 0.0–1.2)
BLOOD UREA NITROGEN: 19 mg/dL (ref 7–21)
BUN / CREAT RATIO: 19
CALCIUM: 9.4 mg/dL (ref 8.5–10.2)
CHLORIDE: 103 mmol/L (ref 98–107)
CO2: 26 mmol/L (ref 22.0–30.0)
CREATININE: 0.99 mg/dL (ref 0.60–1.00)
EGFR CKD-EPI AA FEMALE: 72 mL/min/{1.73_m2} (ref >=60)
EGFR CKD-EPI NON-AA FEMALE: 62 mL/min/{1.73_m2} (ref >=60)
GLUCOSE RANDOM: 67 mg/dL (ref 65–179)
POTASSIUM: 4 mmol/L (ref 3.5–5.0)
PROTEIN TOTAL: 6.5 g/dL (ref 6.5–8.3)
SODIUM: 139 mmol/L (ref 135–145)

## 2018-07-12 LAB — MAGNESIUM: Magnesium:MCnc:Pt:Ser/Plas:Qn:: 1.7

## 2018-07-12 LAB — EGFR CKD-EPI NON-AA FEMALE: Lab: 62

## 2018-07-12 LAB — PHOSPHORUS: Phosphate:MCnc:Pt:Ser/Plas:Qn:: 5.3 — ABNORMAL HIGH

## 2018-07-12 LAB — BILIRUBIN, DIRECT: BILIRUBIN DIRECT: 0.1 mg/dL (ref 0.00–0.40)

## 2018-07-12 LAB — MEAN CORPUSCULAR VOLUME: Lab: 98.4

## 2018-07-12 LAB — BILIRUBIN DIRECT: Bilirubin.glucuronidated:MCnc:Pt:Ser/Plas:Qn:: 0.1

## 2018-07-12 LAB — GAMMA GLUTAMYL TRANSFERASE: Gamma glutamyl transferase:CCnc:Pt:Ser/Plas:Qn:: 57 — ABNORMAL HIGH

## 2018-07-12 LAB — TACROLIMUS BLOOD: Lab: 7.4

## 2018-07-12 NOTE — Unmapped (Signed)
Patient's lfts more elevated with today's labs. Spoke with patient, who denied any sx of illness or beginning any other medication. She confirmed she is taking the 10 mg prednisone still. Reviewed with Dr.Serrano, along with her IS. He recommended waiting for Wednesday's lab results to determine if prednisone should be increased. He suggested a bx may be in order if grossly elevated. He also approved of her starting fosamax 70mg  weekly per her rheumatologist's recommendation. Updated patient on doctor's recommendations and reinforced the importance of her adequately hydrating with 70-100 oz daily. She verbalized understanding.

## 2018-07-14 ENCOUNTER — Ambulatory Visit: Admit: 2018-07-14 | Discharge: 2018-07-15 | Payer: PRIVATE HEALTH INSURANCE

## 2018-07-14 DIAGNOSIS — Z79899 Other long term (current) drug therapy: Secondary | ICD-10-CM

## 2018-07-14 DIAGNOSIS — Z944 Liver transplant status: Principal | ICD-10-CM

## 2018-07-14 LAB — COMPREHENSIVE METABOLIC PANEL
ALBUMIN: 4 g/dL (ref 3.5–5.0)
ALKALINE PHOSPHATASE: 67 U/L (ref 38–126)
ALT (SGPT): 149 U/L — ABNORMAL HIGH (ref 15–48)
AST (SGOT): 43 U/L — ABNORMAL HIGH (ref 14–38)
BILIRUBIN TOTAL: 0.6 mg/dL (ref 0.0–1.2)
BLOOD UREA NITROGEN: 21 mg/dL (ref 7–21)
BUN / CREAT RATIO: 19
CALCIUM: 9.4 mg/dL (ref 8.5–10.2)
CHLORIDE: 103 mmol/L (ref 98–107)
CO2: 28 mmol/L (ref 22.0–30.0)
CREATININE: 1.12 mg/dL — ABNORMAL HIGH (ref 0.60–1.00)
EGFR CKD-EPI AA FEMALE: 62 mL/min/{1.73_m2} (ref >=60)
EGFR CKD-EPI NON-AA FEMALE: 54 mL/min/{1.73_m2} — ABNORMAL LOW (ref >=60)
GLUCOSE RANDOM: 71 mg/dL (ref 65–179)
POTASSIUM: 4.8 mmol/L (ref 3.5–5.0)
PROTEIN TOTAL: 6.4 g/dL — ABNORMAL LOW (ref 6.5–8.3)
SODIUM: 136 mmol/L (ref 135–145)

## 2018-07-14 LAB — CBC W/ AUTO DIFF
BASOPHILS ABSOLUTE COUNT: 0.1 10*9/L (ref 0.0–0.1)
BASOPHILS RELATIVE PERCENT: 1.5 %
EOSINOPHILS RELATIVE PERCENT: 1.1 %
HEMATOCRIT: 38.3 % (ref 36.0–46.0)
HEMOGLOBIN: 13 g/dL — ABNORMAL LOW (ref 13.5–16.0)
LYMPHOCYTES ABSOLUTE COUNT: 1.1 10*9/L — ABNORMAL LOW (ref 1.5–5.0)
MEAN CORPUSCULAR HEMOGLOBIN CONC: 34 g/dL (ref 31.0–37.0)
MEAN CORPUSCULAR HEMOGLOBIN: 33.6 pg (ref 26.0–34.0)
MEAN CORPUSCULAR VOLUME: 98.6 fL (ref 80.0–100.0)
MEAN PLATELET VOLUME: 7.1 fL (ref 7.0–10.0)
MONOCYTES ABSOLUTE COUNT: 0.2 10*9/L (ref 0.2–0.8)
MONOCYTES RELATIVE PERCENT: 4.4 %
NEUTROPHILS ABSOLUTE COUNT: 2.4 10*9/L (ref 2.0–7.5)
NEUTROPHILS RELATIVE PERCENT: 62.9 %
PLATELET COUNT: 151 10*9/L (ref 150–440)
RED BLOOD CELL COUNT: 3.88 10*12/L — ABNORMAL LOW (ref 4.00–5.20)
RED CELL DISTRIBUTION WIDTH: 15.4 % — ABNORMAL HIGH (ref 12.0–15.0)
WBC ADJUSTED: 3.8 10*9/L — ABNORMAL LOW (ref 4.5–11.0)

## 2018-07-14 LAB — GLUCOSE RANDOM: Glucose:MCnc:Pt:Ser/Plas:Qn:: 71

## 2018-07-14 LAB — GAMMA GLUTAMYL TRANSFERASE: Gamma glutamyl transferase:CCnc:Pt:Ser/Plas:Qn:: 79 — ABNORMAL HIGH

## 2018-07-14 LAB — MAGNESIUM: Magnesium:MCnc:Pt:Ser/Plas:Qn:: 1.8

## 2018-07-14 LAB — MONOCYTES RELATIVE PERCENT: Lab: 4.4

## 2018-07-14 LAB — BILIRUBIN DIRECT: Bilirubin.glucuronidated:MCnc:Pt:Ser/Plas:Qn:: 0.2

## 2018-07-14 LAB — PHOSPHORUS: Phosphate:MCnc:Pt:Ser/Plas:Qn:: 5.1 — ABNORMAL HIGH

## 2018-07-15 LAB — TACROLIMUS BLOOD: Lab: 8.6

## 2018-07-15 MED ORDER — PREDNISONE 10 MG TABLET
ORAL_TABLET | Freq: Every day | ORAL | 11 refills | 0 days | Status: CP
Start: 2018-07-15 — End: 2018-07-23

## 2018-07-15 NOTE — Unmapped (Addendum)
Patient with continued elevation in lfts. Reviewed with Dr.Serrano who recommended a steroid taper and liver bx, adding that if patient's lfts do not improve within two days, she should come in for IV steroids. Unable to schedule bx with IR for another 2 wks. GI could schedule bx with Dr.Shah next Tuesday. Dr.Shah recommended only increasing steroid back to 20mg  until next Tuesday, since she had been taking that dose previously. Sent msg to Dr.Serrano to update him with arrangements.Spoke to patient about her lab results and plan of care. Directed her to stop taking her aspirin today, to increase her dose of prednisone back up to 20mg  today, to remain NPO after MN on Monday night and check in to the hospital for her bx by 7am Tuesday morning. She verbalized understanding and agreement with plan.

## 2018-07-16 ENCOUNTER — Ambulatory Visit: Admit: 2018-07-16 | Discharge: 2018-07-17 | Payer: PRIVATE HEALTH INSURANCE

## 2018-07-16 DIAGNOSIS — Z944 Liver transplant status: Principal | ICD-10-CM

## 2018-07-16 DIAGNOSIS — Z79899 Other long term (current) drug therapy: Secondary | ICD-10-CM

## 2018-07-16 LAB — COMPREHENSIVE METABOLIC PANEL
ALBUMIN: 4.1 g/dL (ref 3.5–5.0)
ALKALINE PHOSPHATASE: 71 U/L (ref 38–126)
ALT (SGPT): 155 U/L — ABNORMAL HIGH (ref 15–48)
ANION GAP: 10 mmol/L (ref 7–15)
AST (SGOT): 44 U/L — ABNORMAL HIGH (ref 14–38)
BLOOD UREA NITROGEN: 23 mg/dL — ABNORMAL HIGH (ref 7–21)
BUN / CREAT RATIO: 22
CALCIUM: 9.6 mg/dL (ref 8.5–10.2)
CHLORIDE: 103 mmol/L (ref 98–107)
CO2: 26 mmol/L (ref 22.0–30.0)
CREATININE: 1.06 mg/dL — ABNORMAL HIGH (ref 0.60–1.00)
EGFR CKD-EPI AA FEMALE: 66 mL/min/{1.73_m2} (ref >=60)
EGFR CKD-EPI NON-AA FEMALE: 57 mL/min/{1.73_m2} — ABNORMAL LOW (ref >=60)
GLUCOSE RANDOM: 73 mg/dL (ref 65–179)
POTASSIUM: 4.2 mmol/L (ref 3.5–5.0)
PROTEIN TOTAL: 6.5 g/dL (ref 6.5–8.3)
SODIUM: 139 mmol/L (ref 135–145)

## 2018-07-16 LAB — MAGNESIUM
MAGNESIUM: 1.8 mg/dL (ref 1.6–2.2)
Magnesium:MCnc:Pt:Ser/Plas:Qn:: 1.8

## 2018-07-16 LAB — GAMMA GLUTAMYL TRANSFERASE: Gamma glutamyl transferase:CCnc:Pt:Ser/Plas:Qn:: 86 — ABNORMAL HIGH

## 2018-07-16 LAB — CBC W/ AUTO DIFF
BASOPHILS ABSOLUTE COUNT: 0 10*9/L (ref 0.0–0.1)
BASOPHILS RELATIVE PERCENT: 0.5 %
EOSINOPHILS ABSOLUTE COUNT: 0.1 10*9/L (ref 0.0–0.4)
EOSINOPHILS RELATIVE PERCENT: 0.8 %
HEMATOCRIT: 38.4 % (ref 36.0–46.0)
HEMOGLOBIN: 12.6 g/dL — ABNORMAL LOW (ref 13.5–16.0)
LYMPHOCYTES ABSOLUTE COUNT: 1 10*9/L — ABNORMAL LOW (ref 1.5–5.0)
LYMPHOCYTES RELATIVE PERCENT: 18.6 %
MEAN CORPUSCULAR HEMOGLOBIN CONC: 32.8 g/dL (ref 31.0–37.0)
MEAN CORPUSCULAR HEMOGLOBIN: 32.5 pg (ref 26.0–34.0)
MEAN CORPUSCULAR VOLUME: 99 fL (ref 80.0–100.0)
MEAN PLATELET VOLUME: 7.2 fL (ref 7.0–10.0)
MONOCYTES ABSOLUTE COUNT: 0.2 10*9/L (ref 0.2–0.8)
MONOCYTES RELATIVE PERCENT: 3.7 %
NEUTROPHILS ABSOLUTE COUNT: 4.1 10*9/L (ref 2.0–7.5)
PLATELET COUNT: 137 10*9/L — ABNORMAL LOW (ref 150–440)
RED BLOOD CELL COUNT: 3.88 10*12/L — ABNORMAL LOW (ref 4.00–5.20)
RED CELL DISTRIBUTION WIDTH: 15.1 % — ABNORMAL HIGH (ref 12.0–15.0)
WBC ADJUSTED: 5.4 10*9/L (ref 4.5–11.0)

## 2018-07-16 LAB — PHOSPHORUS: Phosphate:MCnc:Pt:Ser/Plas:Qn:: 4.7

## 2018-07-16 LAB — LARGE UNSTAINED CELLS: Lab: 1

## 2018-07-16 LAB — SODIUM: Sodium:SCnc:Pt:Ser/Plas:Qn:: 139

## 2018-07-16 LAB — BILIRUBIN DIRECT: Bilirubin.glucuronidated:MCnc:Pt:Ser/Plas:Qn:: 0.1

## 2018-07-17 LAB — TACROLIMUS BLOOD: Lab: 9.6

## 2018-07-19 ENCOUNTER — Ambulatory Visit: Admit: 2018-07-19 | Discharge: 2018-07-20 | Payer: PRIVATE HEALTH INSURANCE

## 2018-07-19 DIAGNOSIS — Z944 Liver transplant status: Principal | ICD-10-CM

## 2018-07-19 DIAGNOSIS — Z79899 Other long term (current) drug therapy: Secondary | ICD-10-CM

## 2018-07-19 LAB — CBC W/ AUTO DIFF
BASOPHILS ABSOLUTE COUNT: 0.1 10*9/L (ref 0.0–0.1)
BASOPHILS RELATIVE PERCENT: 1.2 %
EOSINOPHILS RELATIVE PERCENT: 1.4 %
HEMATOCRIT: 40.9 % (ref 36.0–46.0)
HEMOGLOBIN: 13.6 g/dL (ref 13.5–16.0)
LARGE UNSTAINED CELLS: 1 % (ref 0–4)
LYMPHOCYTES ABSOLUTE COUNT: 1 10*9/L — ABNORMAL LOW (ref 1.5–5.0)
LYMPHOCYTES RELATIVE PERCENT: 21.6 %
MEAN CORPUSCULAR HEMOGLOBIN CONC: 33.3 g/dL (ref 31.0–37.0)
MEAN CORPUSCULAR VOLUME: 98.5 fL (ref 80.0–100.0)
MEAN PLATELET VOLUME: 7.1 fL (ref 7.0–10.0)
MONOCYTES ABSOLUTE COUNT: 0.2 10*9/L (ref 0.2–0.8)
MONOCYTES RELATIVE PERCENT: 4.7 %
NEUTROPHILS ABSOLUTE COUNT: 3.1 10*9/L (ref 2.0–7.5)
PLATELET COUNT: 144 10*9/L — ABNORMAL LOW (ref 150–440)
RED BLOOD CELL COUNT: 4.15 10*12/L (ref 4.00–5.20)
RED CELL DISTRIBUTION WIDTH: 15 % (ref 12.0–15.0)
WBC ADJUSTED: 4.4 10*9/L — ABNORMAL LOW (ref 4.5–11.0)

## 2018-07-19 LAB — CO2: Carbon dioxide:SCnc:Pt:Ser/Plas:Qn:: 26

## 2018-07-19 LAB — COMPREHENSIVE METABOLIC PANEL
ALBUMIN: 4 g/dL (ref 3.5–5.0)
ALKALINE PHOSPHATASE: 67 U/L (ref 38–126)
ALT (SGPT): 137 U/L — ABNORMAL HIGH (ref 15–48)
ANION GAP: 10 mmol/L (ref 7–15)
AST (SGOT): 47 U/L — ABNORMAL HIGH (ref 14–38)
BILIRUBIN TOTAL: 0.6 mg/dL (ref 0.0–1.2)
BLOOD UREA NITROGEN: 23 mg/dL — ABNORMAL HIGH (ref 7–21)
BUN / CREAT RATIO: 21
CALCIUM: 9.3 mg/dL (ref 8.5–10.2)
CHLORIDE: 103 mmol/L (ref 98–107)
CO2: 26 mmol/L (ref 22.0–30.0)
CREATININE: 1.07 mg/dL — ABNORMAL HIGH (ref 0.60–1.00)
EGFR CKD-EPI AA FEMALE: 65 mL/min/{1.73_m2} (ref >=60)
EGFR CKD-EPI NON-AA FEMALE: 57 mL/min/{1.73_m2} — ABNORMAL LOW (ref >=60)
GLUCOSE RANDOM: 71 mg/dL (ref 65–179)
POTASSIUM: 3.9 mmol/L (ref 3.5–5.0)
SODIUM: 139 mmol/L (ref 135–145)

## 2018-07-19 LAB — PHOSPHORUS: Phosphate:MCnc:Pt:Ser/Plas:Qn:: 5 — ABNORMAL HIGH

## 2018-07-19 LAB — BILIRUBIN DIRECT: Bilirubin.glucuronidated:MCnc:Pt:Ser/Plas:Qn:: 0.1

## 2018-07-19 LAB — MONOCYTES RELATIVE PERCENT: Lab: 4.7

## 2018-07-19 LAB — GAMMA GLUTAMYL TRANSFERASE: Gamma glutamyl transferase:CCnc:Pt:Ser/Plas:Qn:: 88 — ABNORMAL HIGH

## 2018-07-19 LAB — MAGNESIUM: Magnesium:MCnc:Pt:Ser/Plas:Qn:: 1.7

## 2018-07-19 NOTE — Unmapped (Signed)
error 

## 2018-07-20 ENCOUNTER — Ambulatory Visit: Admit: 2018-07-20 | Discharge: 2018-07-20 | Payer: PRIVATE HEALTH INSURANCE

## 2018-07-20 LAB — TACROLIMUS BLOOD: Lab: 10.1

## 2018-07-21 ENCOUNTER — Ambulatory Visit: Admit: 2018-07-21 | Discharge: 2018-07-22 | Payer: PRIVATE HEALTH INSURANCE

## 2018-07-21 DIAGNOSIS — Z944 Liver transplant status: Principal | ICD-10-CM

## 2018-07-21 DIAGNOSIS — Z79899 Other long term (current) drug therapy: Secondary | ICD-10-CM

## 2018-07-21 LAB — CBC W/ AUTO DIFF
BASOPHILS ABSOLUTE COUNT: 0 10*9/L (ref 0.0–0.1)
BASOPHILS RELATIVE PERCENT: 1 %
EOSINOPHILS ABSOLUTE COUNT: 0.1 10*9/L (ref 0.0–0.4)
EOSINOPHILS RELATIVE PERCENT: 1.2 %
HEMATOCRIT: 39.5 % (ref 36.0–46.0)
HEMOGLOBIN: 13.4 g/dL — ABNORMAL LOW (ref 13.5–16.0)
LARGE UNSTAINED CELLS: 1 % (ref 0–4)
LYMPHOCYTES ABSOLUTE COUNT: 0.9 10*9/L — ABNORMAL LOW (ref 1.5–5.0)
MEAN CORPUSCULAR HEMOGLOBIN CONC: 33.9 g/dL (ref 31.0–37.0)
MEAN CORPUSCULAR HEMOGLOBIN: 33.4 pg (ref 26.0–34.0)
MEAN CORPUSCULAR VOLUME: 98.4 fL (ref 80.0–100.0)
MEAN PLATELET VOLUME: 6.8 fL — ABNORMAL LOW (ref 7.0–10.0)
MONOCYTES ABSOLUTE COUNT: 0.2 10*9/L (ref 0.2–0.8)
MONOCYTES RELATIVE PERCENT: 3.9 %
NEUTROPHILS ABSOLUTE COUNT: 3 10*9/L (ref 2.0–7.5)
NEUTROPHILS RELATIVE PERCENT: 70.8 %
PLATELET COUNT: 138 10*9/L — ABNORMAL LOW (ref 150–440)
RED BLOOD CELL COUNT: 4.01 10*12/L (ref 4.00–5.20)
RED CELL DISTRIBUTION WIDTH: 15.3 % — ABNORMAL HIGH (ref 12.0–15.0)

## 2018-07-21 LAB — BILIRUBIN DIRECT: Bilirubin.glucuronidated:MCnc:Pt:Ser/Plas:Qn:: 0.1

## 2018-07-21 LAB — COMPREHENSIVE METABOLIC PANEL
ALBUMIN: 4 g/dL (ref 3.5–5.0)
ALKALINE PHOSPHATASE: 76 U/L (ref 38–126)
ALT (SGPT): 155 U/L — ABNORMAL HIGH (ref 15–48)
ANION GAP: 9 mmol/L (ref 7–15)
AST (SGOT): 38 U/L (ref 14–38)
BILIRUBIN TOTAL: 0.5 mg/dL (ref 0.0–1.2)
BLOOD UREA NITROGEN: 21 mg/dL (ref 7–21)
BUN / CREAT RATIO: 20
CALCIUM: 9.4 mg/dL (ref 8.5–10.2)
CHLORIDE: 103 mmol/L (ref 98–107)
CO2: 27 mmol/L (ref 22.0–30.0)
EGFR CKD-EPI AA FEMALE: 66 mL/min/{1.73_m2} (ref >=60)
EGFR CKD-EPI NON-AA FEMALE: 57 mL/min/{1.73_m2} — ABNORMAL LOW (ref >=60)
GLUCOSE RANDOM: 78 mg/dL (ref 65–179)
POTASSIUM: 4 mmol/L (ref 3.5–5.0)
PROTEIN TOTAL: 6.4 g/dL — ABNORMAL LOW (ref 6.5–8.3)
SODIUM: 139 mmol/L (ref 135–145)

## 2018-07-21 LAB — LYMPHOCYTES ABSOLUTE COUNT: Lab: 0.9 — ABNORMAL LOW

## 2018-07-21 LAB — MAGNESIUM: Magnesium:MCnc:Pt:Ser/Plas:Qn:: 1.7

## 2018-07-21 LAB — POTASSIUM: Potassium:SCnc:Pt:Ser/Plas:Qn:: 4

## 2018-07-21 LAB — PHOSPHORUS: Phosphate:MCnc:Pt:Ser/Plas:Qn:: 4.4

## 2018-07-21 LAB — GAMMA GLUTAMYL TRANSFERASE: Gamma glutamyl transferase:CCnc:Pt:Ser/Plas:Qn:: 110 — ABNORMAL HIGH

## 2018-07-21 LAB — BILIRUBIN, DIRECT: BILIRUBIN DIRECT: 0.1 mg/dL (ref 0.00–0.40)

## 2018-07-21 MED FILL — SULFAMETHOXAZOLE 400 MG-TRIMETHOPRIM 80 MG TABLET: ORAL | 28 days supply | Qty: 12 | Fill #2

## 2018-07-21 MED FILL — SULFAMETHOXAZOLE 400 MG-TRIMETHOPRIM 80 MG TABLET: 28 days supply | Qty: 12 | Fill #2 | Status: AC

## 2018-07-22 LAB — TACROLIMUS BLOOD: Lab: 8.9

## 2018-07-22 MED FILL — URSODIOL 300 MG CAPSULE: 30 days supply | Qty: 60 | Fill #2 | Status: AC

## 2018-07-22 MED FILL — URSODIOL 300 MG CAPSULE: ORAL | 30 days supply | Qty: 60 | Fill #2

## 2018-07-22 NOTE — Unmapped (Signed)
error 

## 2018-07-23 ENCOUNTER — Ambulatory Visit: Admit: 2018-07-23 | Discharge: 2018-07-24 | Payer: PRIVATE HEALTH INSURANCE

## 2018-07-23 DIAGNOSIS — Z944 Liver transplant status: Principal | ICD-10-CM

## 2018-07-23 DIAGNOSIS — Z79899 Other long term (current) drug therapy: Secondary | ICD-10-CM

## 2018-07-23 LAB — COMPREHENSIVE METABOLIC PANEL
ALBUMIN: 4 g/dL (ref 3.5–5.0)
ALKALINE PHOSPHATASE: 69 U/L (ref 38–126)
ALT (SGPT): 100 U/L — ABNORMAL HIGH (ref 15–48)
ANION GAP: 8 mmol/L (ref 7–15)
AST (SGOT): 25 U/L (ref 14–38)
BILIRUBIN TOTAL: 0.6 mg/dL (ref 0.0–1.2)
BUN / CREAT RATIO: 20
CALCIUM: 9.3 mg/dL (ref 8.5–10.2)
CHLORIDE: 104 mmol/L (ref 98–107)
CO2: 28 mmol/L (ref 22.0–30.0)
CREATININE: 1.07 mg/dL — ABNORMAL HIGH (ref 0.60–1.00)
EGFR CKD-EPI AA FEMALE: 65 mL/min/{1.73_m2} (ref >=60)
EGFR CKD-EPI NON-AA FEMALE: 57 mL/min/{1.73_m2} — ABNORMAL LOW (ref >=60)
GLUCOSE RANDOM: 75 mg/dL (ref 65–179)
POTASSIUM: 4.4 mmol/L (ref 3.5–5.0)
SODIUM: 140 mmol/L (ref 135–145)

## 2018-07-23 LAB — CBC W/ AUTO DIFF
BASOPHILS ABSOLUTE COUNT: 0.1 10*9/L (ref 0.0–0.1)
BASOPHILS RELATIVE PERCENT: 1.6 %
EOSINOPHILS ABSOLUTE COUNT: 0.1 10*9/L (ref 0.0–0.4)
EOSINOPHILS RELATIVE PERCENT: 1.2 %
HEMATOCRIT: 40.4 % (ref 36.0–46.0)
HEMOGLOBIN: 13.4 g/dL — ABNORMAL LOW (ref 13.5–16.0)
LARGE UNSTAINED CELLS: 1 % (ref 0–4)
LYMPHOCYTES ABSOLUTE COUNT: 1.1 10*9/L — ABNORMAL LOW (ref 1.5–5.0)
MEAN CORPUSCULAR HEMOGLOBIN CONC: 33.2 g/dL (ref 31.0–37.0)
MEAN CORPUSCULAR HEMOGLOBIN: 32.8 pg (ref 26.0–34.0)
MEAN PLATELET VOLUME: 7.8 fL (ref 7.0–10.0)
MONOCYTES ABSOLUTE COUNT: 0.2 10*9/L (ref 0.2–0.8)
MONOCYTES RELATIVE PERCENT: 5.2 %
NEUTROPHILS ABSOLUTE COUNT: 3 10*9/L (ref 2.0–7.5)
NEUTROPHILS RELATIVE PERCENT: 66.2 %
PLATELET COUNT: 144 10*9/L — ABNORMAL LOW (ref 150–440)
RED BLOOD CELL COUNT: 4.09 10*12/L (ref 4.00–5.20)
RED CELL DISTRIBUTION WIDTH: 15 % (ref 12.0–15.0)
WBC ADJUSTED: 4.5 10*9/L (ref 4.5–11.0)

## 2018-07-23 LAB — EGFR CKD-EPI AA FEMALE: Lab: 65

## 2018-07-23 LAB — BILIRUBIN DIRECT: Bilirubin.glucuronidated:MCnc:Pt:Ser/Plas:Qn:: 0.1

## 2018-07-23 LAB — MAGNESIUM: Magnesium:MCnc:Pt:Ser/Plas:Qn:: 1.8

## 2018-07-23 LAB — LYMPHOCYTES RELATIVE PERCENT: Lab: 24.6

## 2018-07-23 LAB — GAMMA GLUTAMYL TRANSFERASE: Gamma glutamyl transferase:CCnc:Pt:Ser/Plas:Qn:: 89 — ABNORMAL HIGH

## 2018-07-23 LAB — PHOSPHORUS: Phosphate:MCnc:Pt:Ser/Plas:Qn:: 5.1 — ABNORMAL HIGH

## 2018-07-23 MED ORDER — PREDNISONE 10 MG TABLET
ORAL_TABLET | Freq: Every day | ORAL | 5 refills | 0.00000 days | Status: CP
Start: 2018-07-23 — End: 2018-08-09

## 2018-07-23 NOTE — Unmapped (Addendum)
Patient's bx results from this week indeterminate for rejection, but  ALT still trending up. Messaged Dr.Serrano and Dr.Shah. Dr.Shah recommended increasing patient's pred to 40mg  daily for an extended period. Left detailed VM for patient explaining preliminary findings of bx and recommendation to increase prednisone to 40mg  daily. Encouraged her to return call to coordinator to confirm receipt of msg. She returned call, verbalized understanding, and reiterated the prescribed dose of prednisone she should begin taking.

## 2018-07-24 LAB — TACROLIMUS BLOOD: Lab: 10.1

## 2018-07-26 ENCOUNTER — Ambulatory Visit: Admit: 2018-07-26 | Discharge: 2018-07-27 | Payer: PRIVATE HEALTH INSURANCE

## 2018-07-26 DIAGNOSIS — Z944 Liver transplant status: Principal | ICD-10-CM

## 2018-07-26 DIAGNOSIS — Z79899 Other long term (current) drug therapy: Secondary | ICD-10-CM

## 2018-07-26 LAB — COMPREHENSIVE METABOLIC PANEL
ALBUMIN: 3.7 g/dL (ref 3.5–5.0)
ALKALINE PHOSPHATASE: 63 U/L (ref 38–126)
ALT (SGPT): 117 U/L — ABNORMAL HIGH (ref 15–48)
ANION GAP: 10 mmol/L (ref 7–15)
AST (SGOT): 50 U/L — ABNORMAL HIGH (ref 14–38)
BILIRUBIN TOTAL: 0.5 mg/dL (ref 0.0–1.2)
BLOOD UREA NITROGEN: 24 mg/dL — ABNORMAL HIGH (ref 7–21)
BUN / CREAT RATIO: 23
CHLORIDE: 102 mmol/L (ref 98–107)
CO2: 27 mmol/L (ref 22.0–30.0)
CREATININE: 1.05 mg/dL — ABNORMAL HIGH (ref 0.60–1.00)
EGFR CKD-EPI AA FEMALE: 67 mL/min/{1.73_m2} (ref >=60)
EGFR CKD-EPI NON-AA FEMALE: 58 mL/min/{1.73_m2} — ABNORMAL LOW (ref >=60)
GLUCOSE RANDOM: 86 mg/dL (ref 65–179)
POTASSIUM: 3.9 mmol/L (ref 3.5–5.0)
PROTEIN TOTAL: 6.1 g/dL — ABNORMAL LOW (ref 6.5–8.3)
SODIUM: 139 mmol/L (ref 135–145)

## 2018-07-26 LAB — CBC W/ AUTO DIFF
BASOPHILS ABSOLUTE COUNT: 0 10*9/L (ref 0.0–0.1)
BASOPHILS RELATIVE PERCENT: 0.7 %
EOSINOPHILS ABSOLUTE COUNT: 0.1 10*9/L (ref 0.0–0.4)
EOSINOPHILS RELATIVE PERCENT: 1.1 %
HEMATOCRIT: 38.5 % (ref 36.0–46.0)
HEMOGLOBIN: 12.9 g/dL — ABNORMAL LOW (ref 13.5–16.0)
LARGE UNSTAINED CELLS: 1 % (ref 0–4)
LYMPHOCYTES ABSOLUTE COUNT: 1.1 10*9/L — ABNORMAL LOW (ref 1.5–5.0)
LYMPHOCYTES RELATIVE PERCENT: 21.8 %
MEAN CORPUSCULAR HEMOGLOBIN CONC: 33.5 g/dL (ref 31.0–37.0)
MEAN CORPUSCULAR HEMOGLOBIN: 32.6 pg (ref 26.0–34.0)
MEAN CORPUSCULAR VOLUME: 97.4 fL (ref 80.0–100.0)
MEAN PLATELET VOLUME: 6.9 fL — ABNORMAL LOW (ref 7.0–10.0)
MONOCYTES ABSOLUTE COUNT: 0.2 10*9/L (ref 0.2–0.8)
MONOCYTES RELATIVE PERCENT: 4.2 %
PLATELET COUNT: 149 10*9/L — ABNORMAL LOW (ref 150–440)
RED BLOOD CELL COUNT: 3.95 10*12/L — ABNORMAL LOW (ref 4.00–5.20)
RED CELL DISTRIBUTION WIDTH: 14.8 % (ref 12.0–15.0)
WBC ADJUSTED: 4.8 10*9/L (ref 4.5–11.0)

## 2018-07-26 LAB — MAGNESIUM: Magnesium:MCnc:Pt:Ser/Plas:Qn:: 1.8

## 2018-07-26 LAB — BILIRUBIN DIRECT: Bilirubin.glucuronidated:MCnc:Pt:Ser/Plas:Qn:: 0.2

## 2018-07-26 LAB — MEAN CORPUSCULAR VOLUME: Lab: 97.4

## 2018-07-26 LAB — BILIRUBIN TOTAL: Bilirubin:MCnc:Pt:Ser/Plas:Qn:: 0.5

## 2018-07-26 LAB — GAMMA GLUTAMYL TRANSFERASE: Gamma glutamyl transferase:CCnc:Pt:Ser/Plas:Qn:: 104 — ABNORMAL HIGH

## 2018-07-27 LAB — TACROLIMUS BLOOD: Lab: 7.4

## 2018-07-27 NOTE — Unmapped (Signed)
Reviewed patient's 10/14 lab results with Dr.Shah, who recommended no intervention, but that she continue on the 40 mg prednisone with twice weekly lab monitoring.

## 2018-07-28 ENCOUNTER — Ambulatory Visit: Admit: 2018-07-28 | Discharge: 2018-07-29 | Payer: PRIVATE HEALTH INSURANCE

## 2018-07-28 DIAGNOSIS — Z944 Liver transplant status: Principal | ICD-10-CM

## 2018-07-28 DIAGNOSIS — E612 Magnesium deficiency: Secondary | ICD-10-CM

## 2018-07-28 DIAGNOSIS — E559 Vitamin D deficiency, unspecified: Secondary | ICD-10-CM

## 2018-07-28 DIAGNOSIS — Z5181 Encounter for therapeutic drug level monitoring: Secondary | ICD-10-CM

## 2018-07-28 DIAGNOSIS — E789 Disorder of lipoprotein metabolism, unspecified: Secondary | ICD-10-CM

## 2018-07-28 DIAGNOSIS — R739 Hyperglycemia, unspecified: Secondary | ICD-10-CM

## 2018-07-28 LAB — ALKALINE PHOSPHATASE: Alkaline phosphatase:CCnc:Pt:Ser/Plas:Qn:: 64

## 2018-07-28 LAB — CBC W/ AUTO DIFF
BASOPHILS ABSOLUTE COUNT: 0 10*9/L (ref 0.0–0.1)
BASOPHILS RELATIVE PERCENT: 0.9 %
EOSINOPHILS ABSOLUTE COUNT: 0 10*9/L (ref 0.0–0.4)
EOSINOPHILS RELATIVE PERCENT: 0.7 %
HEMATOCRIT: 39.6 % (ref 36.0–46.0)
HEMOGLOBIN: 13 g/dL — ABNORMAL LOW (ref 13.5–16.0)
LYMPHOCYTES ABSOLUTE COUNT: 0.9 10*9/L — ABNORMAL LOW (ref 1.5–5.0)
LYMPHOCYTES RELATIVE PERCENT: 20.6 %
MEAN CORPUSCULAR HEMOGLOBIN CONC: 32.9 g/dL (ref 31.0–37.0)
MEAN CORPUSCULAR HEMOGLOBIN: 32.6 pg (ref 26.0–34.0)
MEAN CORPUSCULAR VOLUME: 98.9 fL (ref 80.0–100.0)
MONOCYTES ABSOLUTE COUNT: 0.2 10*9/L (ref 0.2–0.8)
MONOCYTES RELATIVE PERCENT: 4.8 %
NEUTROPHILS ABSOLUTE COUNT: 3.3 10*9/L (ref 2.0–7.5)
NEUTROPHILS RELATIVE PERCENT: 72.1 %
PLATELET COUNT: 152 10*9/L (ref 150–440)
RED BLOOD CELL COUNT: 4 10*12/L (ref 4.00–5.20)
RED CELL DISTRIBUTION WIDTH: 15 % (ref 12.0–15.0)
WBC ADJUSTED: 4.5 10*9/L (ref 4.5–11.0)

## 2018-07-28 LAB — NEUTROPHIL LEFT SHIFT

## 2018-07-28 LAB — COMPREHENSIVE METABOLIC PANEL
ALBUMIN: 3.7 g/dL (ref 3.5–5.0)
ALT (SGPT): 172 U/L — ABNORMAL HIGH (ref 15–48)
ANION GAP: 11 mmol/L (ref 7–15)
AST (SGOT): 35 U/L (ref 14–38)
BILIRUBIN TOTAL: 0.5 mg/dL (ref 0.0–1.2)
BLOOD UREA NITROGEN: 26 mg/dL — ABNORMAL HIGH (ref 7–21)
BUN / CREAT RATIO: 22
CALCIUM: 9.1 mg/dL (ref 8.5–10.2)
CHLORIDE: 103 mmol/L (ref 98–107)
CREATININE: 1.16 mg/dL — ABNORMAL HIGH (ref 0.60–1.00)
EGFR CKD-EPI AA FEMALE: 59 mL/min/{1.73_m2} — ABNORMAL LOW (ref >=60)
EGFR CKD-EPI NON-AA FEMALE: 51 mL/min/{1.73_m2} — ABNORMAL LOW (ref >=60)
GLUCOSE RANDOM: 80 mg/dL (ref 65–179)
POTASSIUM: 5 mmol/L (ref 3.5–5.0)
PROTEIN TOTAL: 6 g/dL — ABNORMAL LOW (ref 6.5–8.3)
SODIUM: 144 mmol/L (ref 135–145)

## 2018-07-28 LAB — LIPID PANEL
CHOLESTEROL/HDL RATIO SCREEN: 3.4
CHOLESTEROL: 196 mg/dL (ref 100–199)
HDL CHOLESTEROL: 57 mg/dL (ref 40–59)
LDL CHOLESTEROL CALCULATED: 124 mg/dL

## 2018-07-28 LAB — PHOSPHORUS: Phosphate:MCnc:Pt:Ser/Plas:Qn:: 4.8 — ABNORMAL HIGH

## 2018-07-28 LAB — GAMMA GLUTAMYL TRANSFERASE: Gamma glutamyl transferase:CCnc:Pt:Ser/Plas:Qn:: 123 — ABNORMAL HIGH

## 2018-07-28 LAB — ESTIMATED AVERAGE GLUCOSE: Estimated average glucose:MCnc:Pt:Bld:Qn:Estimated from glycated hemoglobin: 108

## 2018-07-28 LAB — SMEAR REVIEW

## 2018-07-28 LAB — BILIRUBIN DIRECT: Bilirubin.glucuronidated:MCnc:Pt:Ser/Plas:Qn:: 0.1

## 2018-07-28 LAB — TACROLIMUS, TROUGH: Lab: 7.1

## 2018-07-28 LAB — CHOLESTEROL/HDL RATIO SCREEN: Lab: 3.4

## 2018-07-28 LAB — MAGNESIUM: Magnesium:MCnc:Pt:Ser/Plas:Qn:: 2

## 2018-07-28 NOTE — Unmapped (Signed)
Patient's ALT continues to trend upward with 10/15 results. Dr.Hayashi recommended continuing to monitor, with which Dr.Serrano agreed, and said this could be managed by the hepatologist, but requested notification if lfts rise into the hundreds.

## 2018-07-29 LAB — VITAMIN D, TOTAL (25OH): Lab: 26.4

## 2018-07-29 NOTE — Unmapped (Signed)
error 

## 2018-08-02 ENCOUNTER — Ambulatory Visit
Admit: 2018-08-02 | Discharge: 2018-08-09 | Disposition: A | Discharge status: Home Health | Payer: PRIVATE HEALTH INSURANCE | Source: Ambulatory Visit | Admitting: Student in an Organized Health Care Education/Training Program

## 2018-08-02 ENCOUNTER — Institutional Professional Consult (permissible substitution)
Admit: 2018-08-02 | Discharge: 2018-08-09 | Disposition: A | Discharge status: Home Health | Payer: PRIVATE HEALTH INSURANCE | Source: Ambulatory Visit | Admitting: Student in an Organized Health Care Education/Training Program

## 2018-08-02 ENCOUNTER — Encounter
Admit: 2018-08-02 | Discharge: 2018-08-09 | Disposition: A | Discharge status: Home Health | Payer: PRIVATE HEALTH INSURANCE | Source: Ambulatory Visit | Admitting: Student in an Organized Health Care Education/Training Program

## 2018-08-02 ENCOUNTER — Ambulatory Visit
Admit: 2018-08-02 | Discharge: 2018-08-09 | Disposition: A | Discharge status: Home Health | Payer: PRIVATE HEALTH INSURANCE | Source: Ambulatory Visit | Attending: Registered" | Admitting: Student in an Organized Health Care Education/Training Program

## 2018-08-02 DIAGNOSIS — Z944 Liver transplant status: Principal | ICD-10-CM

## 2018-08-02 DIAGNOSIS — E612 Magnesium deficiency: Secondary | ICD-10-CM

## 2018-08-02 DIAGNOSIS — K9189 Other postprocedural complications and disorders of digestive system: Principal | ICD-10-CM

## 2018-08-02 DIAGNOSIS — Z5181 Encounter for therapeutic drug level monitoring: Secondary | ICD-10-CM

## 2018-08-02 LAB — COMPREHENSIVE METABOLIC PANEL
ALBUMIN: 3.9 g/dL (ref 3.5–5.0)
ALT (SGPT): 373 U/L — ABNORMAL HIGH (ref 15–48)
ANION GAP: 8 mmol/L (ref 7–15)
AST (SGOT): 106 U/L — ABNORMAL HIGH (ref 14–38)
BILIRUBIN TOTAL: 0.8 mg/dL (ref 0.0–1.2)
BLOOD UREA NITROGEN: 23 mg/dL — ABNORMAL HIGH (ref 7–21)
BUN / CREAT RATIO: 22
CALCIUM: 8.8 mg/dL (ref 8.5–10.2)
CHLORIDE: 102 mmol/L (ref 98–107)
CO2: 27 mmol/L (ref 22.0–30.0)
CREATININE: 1.05 mg/dL — ABNORMAL HIGH (ref 0.60–1.00)
EGFR CKD-EPI AA FEMALE: 67 mL/min/{1.73_m2} (ref >=60)
EGFR CKD-EPI NON-AA FEMALE: 58 mL/min/{1.73_m2} — ABNORMAL LOW (ref >=60)
GLUCOSE RANDOM: 78 mg/dL (ref 65–179)
POTASSIUM: 3.8 mmol/L (ref 3.5–5.0)
SODIUM: 137 mmol/L (ref 135–145)

## 2018-08-02 LAB — PHOSPHORUS
PHOSPHORUS: 4 mg/dL (ref 2.9–4.7)
Phosphate:MCnc:Pt:Ser/Plas:Qn:: 4

## 2018-08-02 LAB — GAMMA GLUTAMYL TRANSFERASE: Gamma glutamyl transferase:CCnc:Pt:Ser/Plas:Qn:: 160 — ABNORMAL HIGH

## 2018-08-02 LAB — CBC W/ AUTO DIFF
BASOPHILS ABSOLUTE COUNT: 0 10*9/L (ref 0.0–0.1)
BASOPHILS RELATIVE PERCENT: 0.9 %
EOSINOPHILS ABSOLUTE COUNT: 0.1 10*9/L (ref 0.0–0.4)
EOSINOPHILS RELATIVE PERCENT: 1 %
HEMATOCRIT: 39.5 % (ref 36.0–46.0)
LARGE UNSTAINED CELLS: 1 % (ref 0–4)
LYMPHOCYTES ABSOLUTE COUNT: 0.8 10*9/L — ABNORMAL LOW (ref 1.5–5.0)
MEAN CORPUSCULAR HEMOGLOBIN CONC: 32.6 g/dL (ref 31.0–37.0)
MEAN CORPUSCULAR HEMOGLOBIN: 31.9 pg (ref 26.0–34.0)
MEAN CORPUSCULAR VOLUME: 98 fL (ref 80.0–100.0)
MEAN PLATELET VOLUME: 7.5 fL (ref 7.0–10.0)
MONOCYTES ABSOLUTE COUNT: 0.5 10*9/L (ref 0.2–0.8)
MONOCYTES RELATIVE PERCENT: 9.4 %
NEUTROPHILS ABSOLUTE COUNT: 3.7 10*9/L (ref 2.0–7.5)
NEUTROPHILS RELATIVE PERCENT: 71.5 %
PLATELET COUNT: 139 10*9/L — ABNORMAL LOW (ref 150–440)
RED BLOOD CELL COUNT: 4.03 10*12/L (ref 4.00–5.20)
RED CELL DISTRIBUTION WIDTH: 14.6 % (ref 12.0–15.0)
WBC ADJUSTED: 5.2 10*9/L (ref 4.5–11.0)

## 2018-08-02 LAB — MAGNESIUM: Magnesium:MCnc:Pt:Ser/Plas:Qn:: 1.8

## 2018-08-02 LAB — BILIRUBIN DIRECT: Bilirubin.glucuronidated:MCnc:Pt:Ser/Plas:Qn:: 0.3

## 2018-08-02 LAB — LYMPHOCYTES ABSOLUTE COUNT: Lab: 0.8 — ABNORMAL LOW

## 2018-08-02 LAB — AST (SGOT): Aspartate aminotransferase:CCnc:Pt:Ser/Plas:Qn:: 106 — ABNORMAL HIGH

## 2018-08-02 LAB — TACROLIMUS, TROUGH: Lab: 7.7

## 2018-08-02 MED ORDER — CHOLECALCIFEROL (VITAMIN D3) 50 MCG (2,000 UNIT) TABLET
Freq: Every day | ORAL | 0.00000 days
Start: 2018-08-02 — End: 2018-08-10

## 2018-08-02 MED ORDER — VALGANCICLOVIR 450 MG TABLET
ORAL_TABLET | ORAL | 2 refills | 0 days | Status: CP
Start: 2018-08-02 — End: 2018-08-09

## 2018-08-02 NOTE — Unmapped (Signed)
Outpatient Adult Nutrition-Transplant Evaluation    Referring Provider: Leotis Shames    Reason for Referral: s/p OLT 04/28/18    PMH:   Patient Active Problem List   Diagnosis   ??? Hepatitis, autoimmune (CMS-HCC)   ??? Gastro-esophageal reflux disease without esophagitis   ??? Conductive hearing loss of both ears   ??? Cirrhosis, non-alcoholic (CMS-HCC)   ??? Abnormal liver enzymes   ??? Adult hypothyroidism   ??? Allergic state   ??? Calculus of gallbladder without cholecystitis without obstruction   ??? Cholesteatoma of attic of left ear   ??? Hand discomfort   ??? History of osteopenia   ??? Hypothyroid   ??? Hypothyroidism, unspecified   ??? Hypokalemia   ??? Iron deficiency anemia   ??? Injury of extensor tendon of left hand   ??? Left hand pain   ??? MRSA (methicillin resistant Staphylococcus aureus)   ??? Rheumatoid arthritis (CMS-HCC)   ??? Sleep apnea   ??? Urinary incontinence in female   ??? Abdominal pain   ??? Hyperbilirubinemia   ??? Elevated LFTs   ??? Pneumonia   ??? Essential hypertension   ??? Liver replaced by transplant (CMS-HCC)     Anthropometrics:   Estimated body mass index is 22.47 kg/m?? as calculated from the following:    Height as of an earlier encounter on 08/02/18: 149.9 cm (4' 11.02).    Weight as of an earlier encounter on 08/02/18: 50.5 kg (111 lb 4.8 oz).  IBW: 43.1 kg  %IBW: 117%  ABW: n/a    Weight History: per EPIC wt hx, pt has been maintaining her weight. Slight gain since pred dose was increased.  Wt Readings from Last 10 Encounters:   08/02/18 50.5 kg (111 lb 4.8 oz)   07/20/18 49 kg (108 lb 0.4 oz)   06/24/18 49 kg (108 lb)   06/24/18 49 kg (108 lb)   06/10/18 49.8 kg (109 lb 11.2 oz)   06/10/18 49.8 kg (109 lb 11.2 oz)   06/02/18 49.4 kg (109 lb)   05/27/18 49.2 kg (108 lb 6.4 oz)   05/27/18 49.3 kg (108 lb 9.6 oz)   05/13/18 57.2 kg (126 lb)     Nutrition Focused Physical Exam:  Nutrition Evaluation  Overall Impressions: Nutrition-Focused Physical Exam not indicated due to lack of malnutrition risk factors. (08/02/18 1243) Relevant Medications, Herbs, Supplements include: Vit D3, prednisone    Relevant Labs: Reviewed; ANC consistently >2    Physical Activity: I get out and walk - some PT exercises. Walks the dog every day BID. Doing house chores.    Dietary Restrictions, Intolerances: food safety precautions     Allergies: No Known Allergies    Hunger and Satiety: I do pretty good    24-Hour recall/usual intake:   1st - scrambled egg w/ frosted mini wheats or a bowl of grits; Ensure Plus  Snack - n/a  2nd - yogurt, couple spoonfuls peanut butter, apple or apple sauce  Snack - n/a  3rd - cabbage soup w/ beef, 1/2 piece cornbread - supper seems to be her biggest meal  Snack - walnuts or almonds; key lime pie (just a little taste)  Beverages - water, sometimes with Crystal Light. 'Sometimes I don't do too well with the water., diet Dr pepper once daily, Ensure    Nutrition History: pt endorses good appetite & intake, stable weight. No n/v/d/c/reflux or reported difficulty chewing/swallowing. Adhering well to food safety precautions.    Social: accompanied by husband, Jorja Loa  Estimated Needs:   Estimated Energy Needs: 1262-1515 kcal/day (25-30 kcal/kg actual BW)  Estimated Protein Needs:  61-76 g protein/day (1.2-1.5 g/kg actual BW)  Estimated Fluid Needs:  per MD    Nutrition Assessment: intake appears inadequate to meet nutrient needs although pt has been maintaining her weight. Suspect weight will begin to decline with increased physical activity. Discussed incorporating afternoon meal or supplement instead of skipping. As ANC is WNL, may liberalize food safety precautions.    Malnutrition Assessment using AND/ASPEN Clinical Characteristics:  Patient does not meet AND/ASPEN criteria for malnutrition at this time (08/02/18 1243).         Nutrition Education: liberalizing food safety precautions    Nutrition Goals:   1. Meet estimated daily needs  2. Balanced macronutrient intake  3. Basic understanding of post-transplant diet 4. Weight maintenance     Interventions:  1. Liberalize food safety precautions  2. Drink mostly water  3. Incorporate an afternoon supplement (Ensure Clear) instead of skipping  4. Continue regular exercise    Materials Provided were:  N/a    Follow-up: in 3 months    Length of visit was: 20 minutes    Jackqulyn Livings MPH, RD, LDN  Pager: [phone]

## 2018-08-02 NOTE — Unmapped (Signed)
Patient admitted to [facility] today as a direct admit to the floor and the medical team has been by the room to talk to the patient about the plan of care for tonight.  Patient currently lying in bed and in no apparent distress at this time.  Patient acclimated to this admission and the plan of care as of the writing of this note.  Will continue to monitor patient for knowledge of plan of care for the duration of the hospitalization through future assessments       Problem: Adult Inpatient Plan of Care  Goal: Plan of Care Review  Outcome: Progressing  Goal: Patient-Specific Goal (Individualization)  Outcome: Progressing  Goal: Absence of Hospital-Acquired Illness or Injury  Outcome: Progressing  Goal: Optimal Comfort and Wellbeing  Outcome: Progressing  Goal: Readiness for Transition of Care  Outcome: Progressing  Goal: Rounds/Family Conference  Outcome: Progressing     Problem: Pain Acute  Goal: Optimal Pain Control  Outcome: Progressing     Problem: Infection  Goal: Infection Symptom Resolution  Outcome: Progressing

## 2018-08-02 NOTE — Unmapped (Signed)
Please see same day clinic note for full H&P

## 2018-08-02 NOTE — Unmapped (Signed)
[facility] CLINIC PHARMACY NOTE  08/02/2018   Sara Sanders  [id number]    Medication changes today:   1. Stop magnesium oxide  2. Start cholecalciferol 2,000 units daily  3. Resume Valcyte 450 mg daily at least until CMV PCR returns    Education/Adherence tools provided today:  1.provided additional education on immunosuppression and transplant related medications including reviewing indications of medications, dosing and side effects     Follow up items:  1. goal of understanding indications and dosing of immunosuppression medications   2. If Valcyte should continue in light of extended use of high dose steroids   3. RA and, once LFTs stable, if symptoms necessitate transition to azathioprine (need TPMT)  4. Consider checking TFTs inpatient and adjust levothyroxine as needed (was reportedly recently elevated but not reacted to by PCP)  5. Tac level pending and defer plan to inpatient team    Next visit with pharmacy in 1-3 months  ____________________________________________________________________    Sara Sanders is a 62 y.o. 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     Other PMH significant for ascites, esophageal varices, hypothyroidism, RA, HTN  Seen by pharmacy today for: medication management; last seen by pharmacy 6 weeks ago    Post op complicated by RA flare in August 2019 treated with increased dose of prednisone and T12/L2 compression fracture that was medically managed.  She was also diagnosed with C diff 8/9 and was treated with a 10 day course of oral vancomycin.     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    CC: Patient complains of fatigue/malaise that may be related to elevated TSH (managed by PCP)     There were no vitals filed for this visit.    No Known Allergies    All medications reviewed and updated.     Medication list includes revisions made during today???s encounter    Outpatient Encounter Medications as of 08/02/2018   Medication Sig Dispense Refill   ??? acetaminophen (TYLENOL) 325 MG tablet TAKE 2 TABLETS BY MOUTH EVERY 4 HOURS AS NEEDED FOR PAIN. MAX 9 TABLETS (3000 MG) PER DAY. 100 each 2   ??? calcium carbonate-vitamin D3 600 mg(1,500mg ) -200 unit per tablet Take 1 tablet by mouth daily.      ??? golimumab (SIMPONI) 50 mg/0.5 mL PnIj pen injector HOLD (Patient not taking: Reported on 06/02/2018)  0   ??? levothyroxine (SYNTHROID) 75 MCG tablet Take 1 tablet (75 mcg total) by mouth daily. 30 tablet 11   ??? magnesium oxide (MAG-OX) 400 mg (241.3 mg magnesium) tablet Take 1 tablet (400 mg total) by mouth daily. 60 tablet 11   ??? mycophenolate (MYFORTIC) 180 MG EC tablet Take 2 tablets (360 mg total) by mouth Two (2) times a day. 180 tablet 11   ??? omeprazole (PRILOSEC) 40 MG capsule TAKE 1 CAPSULE BY MOUTH TWICE DAILY 60 capsule 0   ??? predniSONE (DELTASONE) 10 MG tablet Take 4 tablets (40 mg total) by mouth daily. 120 tablet 5   ??? PROGRAF 1 mg capsule Take 8 capsules (8mg ) by mouth in the morning and take 7 capsules (7mg ) in the evening 30 capsule 11   ??? sulfamethoxazole-trimethoprim (BACTRIM,SEPTRA) 400-80 mg per tablet TAKE 1 TABLET BY MOUTH 3 TIMES WEEKLY 12 each 5   ??? traMADol (ULTRAM) 50 mg tablet TAKE 1 TABLET BY  MOUTH TWICE DAILY AS NEEDED FOR PAIN 60 tablet 1   ??? ursodiol (ACTIGALL) 300 mg capsule Take 1 capsule (300 mg total) by mouth Two (2) times a day. 60 each 11   ??? valGANciclovir (VALCYTE) 450 mg tablet TAKE 1 TABLET BY MOUTH ONCE DAILY 30 tablet 2     No facility-administered encounter medications on file as of 08/02/2018.      Induction agent : steroids only    CURRENT IMMUNOSUPPRESSION: tacrolimus 8 mg qAM and 7 mg qPM    prograf/cyclosporine goal: 8-10   myfortic360  mg PO bid    prednisone 40mg  once daily (since 07/23/18 for RAI 2/9 on liver biopsy)    Patient is tolerating immunosuppression well aside from occasional insomnia and restless legs she attributes to high dose of prednisone    IMMUNOSUPPRESSION DRUG LEVELS:  Lab Results   Component Value Date    Tacrolimus, Trough 7.1 07/28/2018    Tacrolimus, Trough 9.3 05/28/2018    Tacrolimus, Timed 7.4 07/26/2018    Tacrolimus, Timed 10.1 07/23/2018    Tacrolimus, Timed 8.9 07/21/2018     No results found for: CYCLO  No results found for: EVEROLIMUS  No results found for: SIROLIMUS    Prograf level is accurate 12 hour trough     Graft function: stable   DSA: ntd  Biopsies to date: 07/20/18 - indeterminate for acute cellular rejection, 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  Plan to admit for repeat biopsy and treatment for rejection. Will defer transitioning to azathioprine for now given LFT elevation.  Would need to obtain TPMT level.    H/o biliary stent  Meds currently on: urosdiol 300 mg BID  Plan: continue to montior    ID prophylaxis:   CMV Status: D-/ R+, moderate risk. CMV prophylaxis: valganciclovir 450 mg daily x 3 months per protocol completed on 10/17  No results found for: CMVCP  PCP Prophylaxis: bactrim SS 1 tab MWF x 6 months (end 10/29/18)  Thrush:  completed in hospital  Patient is  tolerating infectious prophylaxis well.  Plan: Restart Valcyte ppx 450 mg daily as it's possible that CMV reactivation could be impacting recent increase in LFTs.  If CMV PCR is hegative or is negative again on biopsy will d/w ICID whether or not Valcyte should be continued in the setting of high dose prednsione. Continue to monitor.    RA - defer to local rheumatology for treatment plan. Pt was on golimumab prior to transplant which was held for at least the first 3 months post transplant due to increased infection risk.   Flares were initially being managed by increased prednisone and myfortic + tramadol and tylenol post-transplant.    Received hydrocortisone injection in wrist on 9/3 at Rheum appointment  Plan: defer to rheumatologist for now. May consider resuming Simponi after LFTs normalize and prednisone is on stable dose.    CV Prophylaxis: asa 81 mg was held since 10/10 liver biopsy  The 10-year ASCVD risk score Denman George DC Jr., et al., 2013) is: 2.9%  Statin therapy: Not indicated  Plan: Continue to hold aspirin. Continue to monitor     BP: Goal < 140/90. Clinic vitals reported above  Home BP ranges: not currently checking  Current meds include: none  Plan: Continue to monitor    Anemia:  H/H:   Lab Results   Component Value Date    HGB 13.0 (L) 07/28/2018     Lab Results  Component Value Date    HCT 39.6 07/28/2018     Iron panel:  Lab Results   Component Value Date    IRON 82 12/08/2017    TIBC  12/08/2017      Comment:      Unable to calculate.      FERRITIN 539.0 (H) 12/08/2017     Lab Results   Component Value Date    Iron Saturation (%)  12/08/2017      Comment:      Unable to calculate.       Prior ESA use: none  Plan: Continue to monitor.     DM:   Lab Results   Component Value Date    A1C 5.4 07/28/2018   . Goal A1c < 7  History of Dm? No  Established with endocrinologist/PCP for BG managment? No  Hypoglycemia: no  Plan:  Continue to monitor.    Electrolytes: wnl   Meds currently on: magnesium oxide 400mg  once daily  Plan: Stop due to loose stools.  Continue to monitor     GI/BM: Pt reports 1-2BM daily with diarrhea most days after she takes her medications (doesn't appear to be troublesome)  Meds currently on: omeprazole 40 mg BID (has been taking for several years and can tell when she misses a dose)   Plan: Continue to monitor    Pain: pt reports mild back pain that has much improved. Wrist pain from RA flare has resolved with increased prednisone dose  Meds currently on: acetaminophen 650mg  Q8H PRN (uses a few days per week) , tramadol 50 PRN (hasn't used in weeks)  Plan: Patient wants to keep tramadol on profile just in case. Continue to monitor    Bone health:   Vitamin D Level: 26.4 on 07/28/18. Goal > 30.s  Last DEXA results: 07/14/2017:   - The bone mineral density in the spine measuring L1, L3 and L4 measures 0.976 gm/cm2. ??The ??Z score is 0.7 and the T score is -0.7.  - The total bone mineral density in the proximal left femur measures 0.758 gm/cm2. ??The Z score is -0.6 and the T score is -1.5.  Meds currently on: Calcium +D 1 tablet daily, Fosamax 70 mg every Monday per local rheumatologist   Plan: Start cholecalciferol 2,000 units daily.     Hypothyroidism  Meds currently on: levothyroxine 75 mcg daily - per patient, recent TSH was ~8 and is managed by PCP  Plan: Encouraged patient to touch base with PCP for dose adjustment vs can recheck during hospitalization and potentially adjust dose    Women's/Men's Health:  Sara Sanders is a 62 y.o. Female perimenopausal. Patient reports no men's/women's health issues  Plan: Continue to monitor    Adherence: Patient has good understanding of medications; was able to independently identify names/doses of immunosuppressants and OI meds.  Patient  does fill their own pill box on a regular basis at home. - husband fills med box and is primary caretaker of medications  Patient brought medication card:yes  Pill box:did not bring  Plan: provided basic adherence counseling/intervention    Spent approximately 30 minutes on educating this patient and greater than 50% was spent in direct face to face counseling regarding post transplant medication education. Questions and concerns were address to patient's satisfaction.    Patient was reviewed with Dr.Desai who was agreement with the stated plan:     During this visit, the following was completed:   BP log data assessment  Labs ordered  and evaluated  complex treatment plan >1 DS   Patient education was completed for 11-24 minutes     All questions/concerns were addressed to the patient's satisfaction.  __________________________________________    PATIENT SEEN AND EVALUATED BY:  [name], PHARMD  [employer]  PAGER [phone]

## 2018-08-02 NOTE — Unmapped (Signed)
[facility]VENTION[facility]IOLOG[facility] - General Biopsy Consultation Note          Requesting Attending Physician: Doyce Loose,*  Service Requesting Consult: Surg Transplant [facility])    Date of Service: 08/02/2018  Consulting Interventional Radiologist: Dr. Claretta Fraise      Subjective:       Biopsy Site: Liver    HPI:  Sara Sanders is a 62 y.o. 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  She has had increased LFTs with a recent indeterminate biopsy.  Request is for transjugular biopsy.     Objective:      Pertinent Imaging: Liver U/S dated 05/02/18, reviewed by Dr. Claretta Fraise    Pertinent Laboratory Values:  WBC   Date Value Ref Range Status   08/02/2018 5.2 4.5 - 11.0 10*9/L Final   03/17/2018 2.1 (LL) 3.4 - 10.8 x10E3/uL Final     HGB   Date Value Ref Range Status   08/02/2018 12.9 12.0 - 16.0 g/dL Final   16/07/9603 9.8 (L) 11.1 - 15.9 g/dL Final     HCT   Date Value Ref Range Status   08/02/2018 39.5 36.0 - 46.0 % Final   03/17/2018 27.3 (L) 34.0 - 46.6 % Final     Platelet   Date Value Ref Range Status   08/02/2018 139 (L) 150 - 440 10*9/L Final   03/17/2018 144 (L) 150 - 450 x10E3/uL Final     INR   Date Value Ref Range Status   05/02/2018 1.09  Final   03/17/2018 1.6 (H) 0.8 - 1.2 Final     Comment:     Reference interval is for non-anticoagulated patients.  Suggested INR therapeutic range for Vitamin K  antagonist therapy:     Standard Dose (moderate intensity                    therapeutic range):       2.0 - 3.0     Higher intensity therapeutic range       2.5 - 3.5       Creatinine   Date Value Ref Range Status   08/02/2018 1.05 (H) 0.60 - 1.00 mg/dL Final   54/06/8118 1.47 0.57 - 1.00 mg/dL Final       Anticoaguation: No    Allergies:   No Known Allergies    Physical Exam:    There were no vitals filed for this visit.  General: female in NAD.  Lungs: breathing comfortably on RA  Heart:  Well perfused  ASA Grade: ASA 2 - Patient with mild systemic disease with no functional limitations  Airway assessment: Class 2 - Can visualize soft palate and fauces, tip of uvula is obscured    Assessment:     Sara Sanders is a 62 y.o. 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  She has had increased LFTs with a recent indeterminate biopsy.  Request is for transjugular biopsy.   Marland Kitchen  Pertinent history, imaging and laboratory values in patient's medical record have been reviewed.     Plan/Recommendations:         - VIR recommends proceeding with transjugular liver biopsy with Fluoroscopy.  - Anticipated procedure date: tomorrow   - Please make NPO night prior to procedure  - Please ensure recent CBC, Creatinine, and INR are available    Informed Consent:  This procedure  has been fully reviewed with the patient/patient???s authorized representative. The risks, benefits and alternatives have been explained, and the patient/patient???s authorized representative has consented to the procedure.  --The patient will accept blood products in an emergent situation.  --The patient does not have a Do Not Resuscitate order in effect.    The patient was discussed with Dr. Claretta Fraise.       Levert Feinstein, MD, August 02, 2018, 3:17 PM

## 2018-08-02 NOTE — Unmapped (Signed)
Blood drawn/collected from the left [facility] and sent to lab.

## 2018-08-02 NOTE — Unmapped (Addendum)
Patient seen in clinic today for her 3 mos liver txp f/u. She completed labs for it last week. Today's labs with significant elevation in AST/ALT from last week,let patient know it would likely result in an admission today. Pt.became tearful, reassured her that it is not an uncommon event to face rejection, and that IV steroids would be planned. Patient appears to be doing very well, she denies n/v/constipation/ fever/chills/tremors or issues with LE swelling. She does report she has a loose stool once daily. She verified taking the correct doses of IS daily. She reports she is drinking about 2 liters daily. Spent 20 minutes educating her re.post txp care, reviewing otc meds and providing booklet for reference. She met with nutrition, SW and pharmacy today as well. Per Dr.Leiber and Dr.Desai, patient to be directly admitted for another liver bx/IV steroids, and to r/o infectious origins such as CMV/CDiff. Contacted Admitting to provide information, bed will soon be available. Directed patient to check in at [facility] hosp.downstairs and await call for ready bed. They verbalized understanding of all discussed. No stent noted on KUB, which was reviewed by Dr.Desai.

## 2018-08-02 NOTE — Unmapped (Signed)
TRANSPLANT SURGERY PROGRESS NOTE    Assessment and Plan  Sara Sanders is a 62 y.o. 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 Her course since surgery has been complicated by rheumatoid arthritis flare, maintained on Prednisone, back pain w/ compression fracture, and C. Diff colitis s/p therapy. ALT elevated to 300s in setting of recent biopsy on 07/20/18 indeterminate for acute cellular rejection RAI = 2/9. This is in the setting of slightly lower tacrolimus levels at 7.1    RECOMMENDATIONS:    1. Elevated Liver Enzymes: ALT up to 300s today. Recent biopsy on 07/20/18 indeterminate for acute cellular rejection RAI = 2/9, negative for CMV. She was treated with Prednisone 40 mh daily. Tacro slightly low at 7.1. Likely has a component of acute cellular rejection and requires IV steroids. However, repeat biopsy should be performed given her diarrhea symptoms and to rule out infection prior to initiation of steroids. Alternatively, she just recently stopped her Valcyte on 07/29/18 and may have CMV contributing to rejection. Also possible is recurrent autoimmune hepatitis.   -Admit for expedited evaluation of elevated liver enzymes  -Send CMV PCR as well as stool studies for GI pathogen panel/C Difficile in setting of diarrhea  -Consult VIR for transjugular liver biopsy next available. NPO after MN today. ASA 81 mg daily already on hold from last biopsy.   -Liver US transplant  -Depending on findings will consider IV steroids for ACR. Continue current dose of prednisone until have biopsy results back     2. Immunosuppression:  -Continue current regimen tacrolimus 8 mg in AM and 7 mg in PM. Tacro trough goal 8-10.  -Continue current Myfortic 260 mg BID  -Continue current Prednisone 40 mg daily for now until biopsy results back.   -While on prednisone continue calcium, vitamin D and PPI BID for bone health and ulcer prevention.     3. ID Prophylaxis:  -CMV (Donor -/Recipient +): Moderate Risk. Just completed valcyte on 07/29/18. Continue to hold for now until CMV PCR studies and biopsy result. Will discuss with ID need for extended prophylaxis while on higher dose mmunosuppression.   -PCP: Bactrim M/W/F until 10/30/19    4. Rheumatoid Arthritis: Longstanding history of steroids and 6-MP for autoimmune hepatitis. Was on Simponi for her RA which is now on hold in setting of current dose titration of immunosuppression.   -Prednisone as above    5. 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 Preventative Health:   -Hyperlipidemia/Hypertension/DM*: The patient denies chest pain, visual changes, headaches, dyspnea, peripheral edema, hypoglycemic episodes, paresthesias, polyuria, or polydipsia.   Continue ASA 81 mg daily after liver biopsy  - Bone Health:  Osteoporosis, on calcium plus vitamin D, and Fosamax.    Repeat DEXA annually  - Dermatology: This patient is at increased risk for the development of skin cancers due to immunosuppressant medications. We recommend yearly surveillance. The patient has been informed of the same.  - CRC screening: Colonoscopy. Prior reports need to be evaluated.   -Women's Health: Mammogram and PAP smear are up to date per patient report    7. Vaccinations: We recommend that patients have vaccinations to prevent various infections that can occur, especially in the setting of having underlying liver disease. The  following vaccinations should be given:  -Hepatitis A: immunized  -Hepatitis B: non-immune. Will need repeat HBV series at some point after 3 months.   -Influenza (yearly): she can get this during current hospitalization at discharge.   -Pneumococcal: PPSV23 and PCV13 will be needed  -Zoster: Shingrix (age > 50) indicated at some point after 3 months.     Subjective  Sara Sanders is a 62 y.o. 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 Her course since surgery has been complicated by rheumatoid arthritis flare, maintained on Prednisone 20mg  daily, back pain, and C. Diff colitis s/p therapy.     Interval Events:  Since last seen on 06/24/18, she has been doing well apart from fluctuating liver enzymes. Biopsy on 07/20/18 as below indeterminate for ACR. She was started on Prednisone 40 mg daily. On prednisone, she is a little jittery. Having loose stools once a day always after taking medications which is stable from prior. No abdominal pain, fevers. No headaches. No more swelling LE. She reports eating and drinking well. She is up ambulating often. She denies any arthritis pain on prednisone. She is taking all medications as prescribed without reported side effects.      Objective    Vitals:    08/02/18 0919   BP: 139/67   Pulse: 73   Temp: 36.7 ??C   TempSrc: Tympanic   SpO2: 97%   Weight: 50.5 kg (111 lb 4.8 oz)   Height: 149.9 cm (4' 11.02)      Body mass index is 22.47 kg/m??.     Physical Exam:    General Appearance:   Welll appearing F in NAD. Swollen cheeks.   Lungs:                          Normal work of breathing on room air.  Heart:                           Regular rate and rhythm.  Abdomen:                    Soft, non-distended, non-tender to palpation. Well-healing scar.   Extremities:                 No clubbing, cyanosis, or edema.  ??    Data Review:  Lab Results   Component Value Date    WBC 5.2 08/02/2018    HGB 12.9 08/02/2018    HCT 39.5 08/02/2018    PLT 139 (L) 08/02/2018       Lab Results   Component Value Date    NA 137 08/02/2018    K 3.8 08/02/2018    CL 102 08/02/2018    CO2 27.0 08/02/2018    BUN 23 (H) 08/02/2018    CREATININE 1.05 (H) 08/02/2018    GLU 78 08/02/2018    CALCIUM 8.8 08/02/2018    MG 1.8 08/02/2018    PHOS 4.0 08/02/2018       Lab Results   Component Value Date    BILITOT 0.8 08/02/2018    BILIDIR 0.30 08/02/2018    PROT 6.6 08/02/2018    ALBUMIN 3.9 08/02/2018    ALT 373 (H) 08/02/2018    AST 106 (H) 08/02/2018    ALKPHOS 65 08/02/2018  GGT 160 (H) 08/02/2018       Lab Results   Component Value Date    LABPROT 16.2 (H) 03/17/2018    INR 1.09 05/02/2018    APTT 25.5 (L) 04/29/2018     Liver Biopsy (07/20/18):  Final Diagnosis   A: Liver, 2.5 months post transplant, core biopsy  - Indeterminate for acute cellular rejection, RAI = 2/9 (portal inflammation score = 1, bile duct damage score = 1, venous endothelialitis score = 0)  - Scant neutrophilic portal inflammation and rare apoptotic hepatocytes  - No significant steatosis identified  - Immunochemical stain for CMV is negative  - See comment  ??     Comment    Rejection activity index ([employer] Schema for Grading Liver Allograft Rejection,  Hepatology 25:658, 1997)    Portal inflammation: 1/3  Bile duct inflammation/damage: 1/3  Venous endothelial inflammation: 0/3  Rejection activity index: 2/9  Global assessment: Indeterminate    Special Stains:  The trichrome stain shows no increased portal fibrosis.  ??  There are rare apoptotic hepatocytes and scant neutrophilic portal inflammation; the differential diagnosis for the patient's elevated liver function tests includes infection and drug-induced liver injury.  There is also mild centrizonal sinusoidal congestion and scattered ceroid-laden macrophages suggestive of possible outflow obstruction.    ??     Imaging:  Abdominal XRay (08/02/18):  Nonobstructive bowel gas pattern. No biliary stent is identified. Multiple surgical clips overlie the upper abdomen. Moderate amount of colonic stool. Multilevel degenerative changes of the spine. No evidence of acute osseous abnormality.  ??  IMPRESSION:  No biliary stent is identified.

## 2018-08-02 NOTE — Unmapped (Signed)
THREE MONTH POST-TRANSPLANT LIVER TRANSPLANT   SOCIAL WORK FOLLOW UP ASSESSMENT       Patient Name: Sara Sanders  Medical Record Number: [id number]  Date of Service: August 02, 2018  PRESENT:        BACKGROUND INFORMATION: Sara Sanders is a 62 y.o. Caucasian, female post liver transplant. Patient received a liver transplant on 04/28/2018 (Liver).     BEHAVIORAL OBSERVATIONS:   She was interviewed with spouse present.     MENTAL STATUS EXAM:  Appearance: Appears stated age  Speech/Language: Normal rate, volume, tone, fluency  Mood: appropriate  Affect: Euthymic  Orientation: Oriented to person, place, time, and general circumstances  Concentration: Able to fully concentrate and attend  Memory: Immediate, short-term, long-term, and recall grossly intact  Insight: Intact  Judgment: Intact      LIVING SITUATION:  Housing: patient lives with spouse and dog  Marital status:married  Children/dependents: one adult son and a year old granddaughter    Adherence:  Medication Adherence: Good  Medication Concerns: denied problems taking medications  Other Adherence: Good     Side Effects: diarrhea  Insurance Coverage: MISC; INSURANCE COVERAGE: Commercial    Lifestyle Issues:  Physical activity:  Good  Nutrition/Appetite:  Good  Sleep: Good  Pain (0=no pain; 10=worst pain imaginable): 0/10  Pain Medications:  none    Substance Issues:  Nicotine/Tobacco: denies  Current alcohol use: denies  Illicit drug use: Denied  -If yes type: N/A  licit substance abuse or misuse: Denied      Social Issues:  Academic librarian Issues: denies  Social Stressors/Changes: denies  Transportation issues: denies  Administrator, sports: denies  Source of Income: retirement and spouse employed FT    Adjustment Issues:  Feelings about transplant: no regrets  PTSD: Denied  What would you change about transplant? Nothing. Patient admits that initially he questioned if he made the right decision to proceed with transplant.     Coping Style: Functional ASSESSMENT: Patient is a 62 year old Caucasian, female post liver transplant on 04/28/18. Patient was alert and oriented x4.     Patient denies any regrets with deciding to receive a liver transplant. She denies SA, MH, and pain issues at this time. Spouse has returned to work and denied any caregiver concerns.                    RECOMMENDATIONS:   1. Annual follow up for continued support and encouragement.       Sibyl Parr, MSW, LCSW  Clinical Social Worker  [facility]   Completed: 08/09/18

## 2018-08-03 DIAGNOSIS — K9189 Other postprocedural complications and disorders of digestive system: Principal | ICD-10-CM

## 2018-08-03 LAB — MAGNESIUM: Magnesium:MCnc:Pt:Ser/Plas:Qn:: 1.8

## 2018-08-03 LAB — COMPREHENSIVE METABOLIC PANEL
ALBUMIN: 3 g/dL — ABNORMAL LOW (ref 3.5–5.0)
ALKALINE PHOSPHATASE: 71 U/L (ref 38–126)
ALT (SGPT): 280 U/L — ABNORMAL HIGH (ref 15–48)
ANION GAP: 5 mmol/L — ABNORMAL LOW (ref 7–15)
BILIRUBIN TOTAL: 0.4 mg/dL (ref 0.0–1.2)
BLOOD UREA NITROGEN: 20 mg/dL (ref 7–21)
BUN / CREAT RATIO: 22
CALCIUM: 8.3 mg/dL — ABNORMAL LOW (ref 8.5–10.2)
CHLORIDE: 107 mmol/L (ref 98–107)
CO2: 25 mmol/L (ref 22.0–30.0)
CREATININE: 0.9 mg/dL (ref 0.60–1.00)
EGFR CKD-EPI AA FEMALE: 80 mL/min/{1.73_m2} (ref >=60)
EGFR CKD-EPI NON-AA FEMALE: 70 mL/min/{1.73_m2} (ref >=60)
GLUCOSE RANDOM: 137 mg/dL (ref 65–179)
POTASSIUM: 3.5 mmol/L (ref 3.5–5.0)
PROTEIN TOTAL: 5.3 g/dL — ABNORMAL LOW (ref 6.5–8.3)
SODIUM: 137 mmol/L (ref 135–145)

## 2018-08-03 LAB — TACROLIMUS, TROUGH: Lab: 9.5

## 2018-08-03 LAB — PT-MIXING STUDY: Lab: 11.5

## 2018-08-03 LAB — CBC
HEMATOCRIT: 36.7 % (ref 36.0–46.0)
HEMOGLOBIN: 11.9 g/dL — ABNORMAL LOW (ref 12.0–16.0)
MEAN CORPUSCULAR HEMOGLOBIN CONC: 32.6 g/dL (ref 31.0–37.0)
MEAN CORPUSCULAR VOLUME: 98.2 fL (ref 80.0–100.0)
MEAN PLATELET VOLUME: 7.5 fL (ref 7.0–10.0)
PLATELET COUNT: 111 10*9/L — ABNORMAL LOW (ref 150–440)
RED BLOOD CELL COUNT: 3.74 10*12/L — ABNORMAL LOW (ref 4.00–5.20)
RED CELL DISTRIBUTION WIDTH: 14.6 % (ref 12.0–15.0)
WBC ADJUSTED: 4.7 10*9/L (ref 4.5–11.0)

## 2018-08-03 LAB — MEAN CORPUSCULAR HEMOGLOBIN CONC: Lab: 32.6

## 2018-08-03 LAB — PHOSPHORUS: Phosphate:MCnc:Pt:Ser/Plas:Qn:: 3.7

## 2018-08-03 LAB — ALKALINE PHOSPHATASE: Alkaline phosphatase:CCnc:Pt:Ser/Plas:Qn:: 71

## 2018-08-03 LAB — PROTIME-INR: INR: 0.97

## 2018-08-03 LAB — PROTIME: Lab: 11.2

## 2018-08-03 NOTE — Unmapped (Addendum)
Sara Sanders is a??62 y.o.??female with PMHx of cirrhosis s/t autoimmune hepatitis c/b ascites, esophageal varices, hypothyroidism, RA, and HTN who underwent OLT on 04/28/18 with a DCD donor. Her course since surgery has been complicated by rheumatoid arthritis flare, maintained on Prednisone 20mg  daily, back pain, and C. Diff colitis s/p therapy.  She underwent a biopsy 07/20/2018 which was indeterminate for ACR and was started on Prednisone 40 mg daily.  Her liver enzymes had continued to elevate when seen in clinic on 08/02/2018.  She was admitted directly from clinic for repeat transjunglar biopsy and continuation of steroids.  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  Laboratory studies sent on the day of admission were notable for continued elevation of increased AST and ALT.      She underwent transjugular liver biopsy in VIR on 08/03/2018 which noted no signs of rejection. A repeat hepatic US showed elevated RIs and a mildly dilated L intrahepatic bile duct and CBD with wall thickening and dilation to 1.1cm. She became febrile on HOD1 to 39.2 so a full fever work up was completed. Blood cultures grew Entercoccus gallinarum/casseliflavus and ID was consulted to provide recommendations regarding antibiotic therapy. She was started on Zosyn.    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 A metal stent was placed into the common bile duct.    She did well thereafter and her diet was slowly advanced and at the time of discharge she was tolerating a regular diet. The patient was able to have her pain controlled with only P.O. pain medication, and return to her preoperative ambulatory status.  Anti-rejection medication levels were monitored and dosages adjusted to maintain a therapeutic regimen. The patient was seen and assessed by Physical Therapy and deemed suitable for discharge. A PICC line was placed so she could continue to get IV antibiotics. She will be discharged home on HD*** in stable condition. She will discharge on an intravenous antibiotic regimen of *** to complete a *** day course.  She will also continue on Prednisone 10mg  daily and will return to clinic for a post-hospitalization follow-up.

## 2018-08-03 NOTE — Unmapped (Signed)
VIR Post-Procedure Note    Procedure Name: transjugular liver biopsy    Pre-Op Diagnosis: Elevated LFTs    Post-Op Diagnosis: Same as pre-operative diagnosis    VIR Providers    Attending: Dr. Alla German  Assistant: Dr. Levert Feinstein    Description of procedure: Transjugular liver biopsy with measurement of pressures. 3 cores    Sedation: Moderate      Estimated Blood Loss: approximately 5 mL  Specimens: 3 cores   Complications: None      See detailed procedure note with images in PACS (IMPAX).    The patient tolerated the procedure well without incident or complication and was returned to the Floor in stable condition.    Levert Feinstein, MD  08/03/2018 10:40 AM

## 2018-08-03 NOTE — Unmapped (Signed)
SRF TEACHING ROUNDS    An interdisciplinary care conference was held today and included the following team members: Aleen Campi, MD Transplant Surgeon, SRF Surgery Resident , Drake Leach, [place] Transplant Physician Assistant, [place] Senate, RN, MSN Transplant Nurse Coordinator, Toy Care, PharmD Transplant Pharmacist, Sibyl Parr, LCSW Transplant Case Manager and West Carbo, MD Transplant Nephrologist.    Per team, pt will go to VIR for biopsy today before noon. 4.7=white count and INR=.97.    Discharge Plan: no date at this time      Sibyl Parr, LCSW, CCTSW  Transplant Case Manager  [facility]

## 2018-08-03 NOTE — Unmapped (Signed)
VSS, afebrile, Patient alert and oriented.  Pt was NPO for ultrasound, completed. Ate a small snack and now is NPO @ midnight for VIR biopsy.  IV fluids started. Pt reports no pain.  Pt ambulates independently. Pt voiding adequate urine output.    Problem: Adult Inpatient Plan of Care  Goal: Plan of Care Review  Outcome: Progressing  Goal: Patient-Specific Goal (Individualization)  Outcome: Progressing  Goal: Absence of Hospital-Acquired Illness or Injury  Outcome: Progressing  Goal: Optimal Comfort and Wellbeing  Outcome: Progressing  Goal: Readiness for Transition of Care  Outcome: Progressing  Goal: Rounds/Family Conference  Outcome: Progressing     Problem: Pain Acute  Goal: Optimal Pain Control  Outcome: Progressing     Problem: Infection  Goal: Infection Symptom Resolution  Outcome: Progressing

## 2018-08-03 NOTE — Unmapped (Signed)
10 for free hepatic pressure =Dixon

## 2018-08-03 NOTE — Unmapped (Signed)
POC reviewed. VSS. Pt to VIR biopsy, occlusive dressing in place. No complaints of  pain. Pt independent, remains free of falls. UOP adequate. Pt tolerating regular diet. Continue to monitor.    Problem: Adult Inpatient Plan of Care  Goal: Plan of Care Review  Outcome: Progressing  Goal: Patient-Specific Goal (Individualization)  Outcome: Progressing  Goal: Absence of Hospital-Acquired Illness or Injury  Outcome: Progressing  Goal: Optimal Comfort and Wellbeing  Outcome: Progressing  Goal: Readiness for Transition of Care  Outcome: Progressing  Goal: Rounds/Family Conference  Outcome: Progressing     Problem: Pain Acute  Goal: Optimal Pain Control  Outcome: Progressing     Problem: Infection  Goal: Infection Symptom Resolution  Outcome: Progressing     Problem: Hypertension Comorbidity  Goal: Blood Pressure in Desired Range  Outcome: Progressing

## 2018-08-03 NOTE — Unmapped (Signed)
Social Work  Psychosocial Assessment    Patient Name: Sara Sanders   Medical Record Number: [id number]   Date of Birth: 10/09/1956  Sex: Female     Referral  Referred by: (txp)  Reason for Referral: Transplant    Extended Emergency Contact Information  Primary Emergency Contact: Weisbecker,Charles Tim  Address: [address]           [place], [place] [address] [place]  Home Phone: [phone]  Mobile Phone: [phone]  Relation: Spouse  Secondary Emergency Contact: [place]  Work Phone: [phone]  Mobile Phone: [phone]  Relation: Son    Armed forces operational officer Next of Kin / Guardian / POA / Advance Directives      Advance Directive (Medical Treatment)  Does patient have an advance directive covering medical treatment?: Patient does not have advance directive covering medical treatment.  Reason patient does not have an advance directive covering medical treatment:: Patient does not wish to complete one at this time  Reason there is not a Health Care Decision Maker appointed:: Patient does not wish to appoint a Health Care Decision Maker at this time  Information provided on advance directive:: Yes  Patient requests assistance:: No         Discharge Planning  Discharge Planning Information:   Type of Residence   Mailing Address:  [address] [place] [address]    Medical Information   Past Medical History:   Diagnosis Date   ??? Apnea, sleep    ??? Arthritis     rheumatoid   ??? Cirrhosis (CMS-HCC)    ??? Disease of thyroid gland    ??? GERD (gastroesophageal reflux disease)    ??? Hepatitis, autoimmune (CMS-HCC)    ??? History of transfusion    ??? Iron deficiency    ??? Pneumonia    ??? Urinary incontinence        Past Surgical History:   Procedure Laterality Date   ??? BLADDER SUSPENSION  1993   ??? CHOLESTEATOMA EXCISION Left     EAR   ??? ear surgeries Bilateral     various   ??? HYSTERECTOMY     ??? PR NEEDLE BIOPSY LIVER N/A 07/20/2018    Procedure: BIOPSY OF LIVER, NEEDLE; PERCUTANEOUS; Surgeon: Pia Mau, MD;  [facility];  Service: Gastroenterology   ??? PR TRANSPLANT LIVER,ALLOTRANSPLANT N/A 04/27/2018    Procedure: LIVER ALLOTRANSPLANTATION; ORTHOTOPIC, PARTIAL OR WHOLE, FROM CADAVER OR LIVING DONOR, ANY AGE;  Surgeon: Florene Glen, MD;  Location: MAIN OR [facility];  Service: Transplant   ??? PR TRANSPLANT,PREP DONOR LIVER, WHOLE N/A 04/27/2018    Procedure: [facility] STD PREP CAD DONOR WHOLE LIVER GFT PRIOR TNSPLNT,INC CHOLE,DISS/REM SURR TISSU WO TRISEG/LOBE SPLT;  Surgeon: Florene Glen, MD;  Location: MAIN OR [facility];  Service: Transplant   ??? PR UPPER GI ENDOSCOPY,DIAGNOSIS N/A 07/09/2017    Procedure: UGI ENDO, INCLUDE ESOPHAGUS, STOMACH, & DUODENUM &/OR JEJUNUM; DX W/WO COLLECTION SPECIMN, BY BRUSH OR WASH;  Surgeon: Pia Mau, MD;  [facility];  Service: Gastroenterology       Family History   Problem Relation Age of Onset   ??? Cancer Sister    ??? Heart disease Mother    ??? Bleeding Disorder Mother    ??? Hypertension Mother    ??? Heart disease Father        Network engineer Insurance: Payor: STATE HEALTH  PLAN / Plan: STATE HEALTH PLAN / Product Type: *No Product type* /    Secondary Insurance: None   Prescription Coverage: Nurse, learning disability (listed above)   Preferred Pharmacy: [facility] - [place], [place] - [address]  [place] [facility]  [facility]  [facility] - [place], [place] - [address]    Barriers to taking medication: No    Transition Home   Transportation at time of discharge: Family/Friend's Private Vehicle    Anticipated changes related to Illness: none   Services in place prior to admission: tbd   Services anticipated for DC: none   Hemodialysis Prior to Admission: No    Readmission  Risk of Unplanned Readmission Score:  %  Readmitted Within the Last 30 Days?   Readmission Factors include: unable to assess    Social Determinants of Health  Social Determinants of Health were addressed in provider documentation.  Please refer to patient history.    Social History  Support Systems: Economist Service: No Conservator, museum/gallery and Psychiatric History  Psychosocial Stressors: Denies      Psychological Issues/Information: No issues              Chemical Dependency: None              Outpatient Providers: Specialist   Name / Solicitor #: Conservator, museum/gallery [facility]  Legal: No legal issues      Ability to [employer]: No issues accessing community services

## 2018-08-03 NOTE — Unmapped (Signed)
Second sample taken eyes open then closed light snore no distress

## 2018-08-03 NOTE — Unmapped (Signed)
ready for bx

## 2018-08-03 NOTE — Unmapped (Signed)
Third core taken transferred to formulin

## 2018-08-03 NOTE — Unmapped (Signed)
Tacrolimus Therapeutic Monitoring Pharmacy Note    Sara Sanders is a 62 y.o. female continuing tacrolimus.     Indication: Liver transplant     Date of Transplant: 04/28/2018      Prior Dosing Information: Home regimen tacrolimus 8mg  qAM and 7mg  qPM     Goals:  Therapeutic Drug Levels  Tacrolimus trough goal: 8-10 ng/mL    Additional Clinical Monitoring/Outcomes  ?? Monitor renal function (SCr and urine output) and liver function (LFTs)  ?? Monitor for signs/symptoms of adverse events (e.g., hyperglycemia, hyperkalemia, hypomagnesemia, hypertension, headache, tremor)    Results:   Tacrolimus level: 7.7 ng/mL, drawn appropriately    Assessment/Plan:  Recommendedation(s)  ? Continue current regimen of 8mg  qam and 7mg  qpm, consider increase with next further levels demonstrating subtherapeutic level    Follow-up  ? Next level to be determined by primary team.   ? A pharmacist will continue to monitor and recommend levels as appropriate    Please page service pharmacist with questions/clarifications.    Darlis Loan, PharmD

## 2018-08-03 NOTE — Unmapped (Signed)
Transplant Surgery Progress Note    Service Date: 08/03/2018  Admit Date: 08/02/2018, Hospital Day: 2  Hospital Service: Surg Transplant [facility])  Attending: Doyce Loose,*    Assessment     Sara Sanders is a 62 y.o. 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 Her post-operative course has been complicated by an RA flare, maintained on prednisone, and elevated ALT (up into the 300s). A recent biopsy on 07/20/18 was indeterminate for acute cellular rejection (RAI = 2/9). She was admitted from clinic on 08/02/18 due to persistently elevated liver enzymes in the setting of prednisone for expedited liver biopsy and ultrasound.       Interval Events   No acute events overnight. Denies any pain, nausea, or vomiting. Resting comfortably and anxious for results of biopsy.     Plan     - prn tylenol, tramadol 50  - SORA, encourage IS and ambulation  - NPO and NS @ 75 for transjugular liver biopsy; can have a regular diet and be medlocked when back from VIR  - protonix, bowel regimen (colace, miralax)  - home synthroid daily  - VIR transjugular liver biopsy today with rush/stat read by surgical pathology; plan will be based on those results  - prograf, cellcept, valcyte, prednisone 40mg  daily, bactrim      Prophylaxis:  - DVT: heparin (subcutaneous). SCDs.     Disposition: Floor status.   -PT/OT recs: none  -Home health needs: pending      Objective     Vitals:   Temp:  [36 ??C-36.7 ??C] 36.4 ??C  Heart Rate:  [63-87] 63  Resp:  [16-18] 18  BP: (118-139)/(59-71) 139/71  MAP (mmHg):  [99] 99  SpO2:  [93 %-99 %] 99 %  BMI (Calculated):  [22.47] 22.47    Physical Exam:  -General:  Appropriate, comfortable and in no apparent distress.   -Neurological: Moves all 4 extremities spontaneously.   -Cardiovascular: HDS  -Pulmonary: Normal work of breathing.   -Abdomen: Soft, non-tender, non-distended. No rebound or guarding. Well healed Mercedes incision.  -Extremities: Warm, well perfused.     Recent Imaging:  Xr Abdomen 1 View    Result Date: 08/02/2018  EXAM: XR ABDOMEN 1 VIEW DATE: 08/02/2018 8:27 AM ACCESSION: [id number] UN DICTATED: 08/02/2018 8:27 AM INTERPRETATION LOCATION: [facility]     CLINICAL INDICATION: 62 years old Female with Eval biliary stent -  Eval biliary stent  - Z94.4 - Liver replaced by transplant (CMS - HCC)      COMPARISON: 04/28/2018 abdominal ultrasound and lumbar spine radiographs 05/17/2018     TECHNIQUE: Supine views of the abdomen.     FINDINGS: Nonobstructive bowel gas pattern. No biliary stent is identified. Multiple surgical clips overlie the upper abdomen. Moderate amount of colonic stool. Multilevel degenerative changes of the spine. No evidence of acute osseous abnormality.         No biliary stent is identified.

## 2018-08-03 NOTE — Unmapped (Signed)
POC reviewed as needed. VSS. Pt NPO for [place] and VIR.   Continue to monitor.      Problem: Adult Inpatient Plan of Care  Goal: Plan of Care Review  Outcome: Progressing  Goal: Patient-Specific Goal (Individualization)  Outcome: Progressing  Goal: Absence of Hospital-Acquired Illness or Injury  Outcome: Progressing  Goal: Optimal Comfort and Wellbeing  Outcome: Progressing  Goal: Readiness for Transition of Care  Outcome: Progressing  Goal: Rounds/Family Conference  Outcome: Progressing     Problem: Pain Acute  Goal: Optimal Pain Control  Outcome: Progressing     Problem: Infection  Goal: Infection Symptom Resolution  Outcome: Progressing

## 2018-08-04 DIAGNOSIS — K9189 Other postprocedural complications and disorders of digestive system: Principal | ICD-10-CM

## 2018-08-04 LAB — URINALYSIS
GLUCOSE UA: NEGATIVE
KETONES UA: NEGATIVE
NITRITE UA: NEGATIVE
PH UA: 5.5 (ref 5.0–9.0)
PROTEIN UA: 30 — AB
RBC UA: 25 /HPF — ABNORMAL HIGH (ref <=4)
SQUAMOUS EPITHELIAL: 1 /HPF (ref 0–5)
UROBILINOGEN UA: 4 — AB
WBC UA: 11 /HPF — ABNORMAL HIGH (ref 0–5)

## 2018-08-04 LAB — CBC
HEMATOCRIT: 37.5 % (ref 36.0–46.0)
HEMATOCRIT: 35.2 % — ABNORMAL LOW (ref 36.0–46.0)
HEMOGLOBIN: 12.2 g/dL (ref 12.0–16.0)
MEAN CORPUSCULAR HEMOGLOBIN CONC: 32.7 g/dL (ref 31.0–37.0)
MEAN CORPUSCULAR HEMOGLOBIN CONC: 33.4 g/dL (ref 31.0–37.0)
MEAN CORPUSCULAR HEMOGLOBIN: 32.4 pg (ref 26.0–34.0)
MEAN CORPUSCULAR HEMOGLOBIN: 32.6 pg (ref 26.0–34.0)
MEAN CORPUSCULAR VOLUME: 99.1 fL (ref 80.0–100.0)
MEAN CORPUSCULAR VOLUME: 97.5 fL (ref 80.0–100.0)
MEAN PLATELET VOLUME: 7.6 fL (ref 7.0–10.0)
MEAN PLATELET VOLUME: 7.1 fL (ref 7.0–10.0)
PLATELET COUNT: 74 10*9/L — ABNORMAL LOW (ref 150–440)
PLATELET COUNT: 87 10*9/L — ABNORMAL LOW (ref 150–440)
RED BLOOD CELL COUNT: 3.78 10*12/L — ABNORMAL LOW (ref 4.00–5.20)
RED BLOOD CELL COUNT: 3.61 10*12/L — ABNORMAL LOW (ref 4.00–5.20)
RED CELL DISTRIBUTION WIDTH: 14.5 % (ref 12.0–15.0)
RED CELL DISTRIBUTION WIDTH: 14.6 % (ref 12.0–15.0)
WBC ADJUSTED: 8.3 10*9/L (ref 4.5–11.0)

## 2018-08-04 LAB — COMPREHENSIVE METABOLIC PANEL
ALBUMIN: 3.2 g/dL — ABNORMAL LOW (ref 3.5–5.0)
ALBUMIN: 3 g/dL — ABNORMAL LOW (ref 3.5–5.0)
ALBUMIN: 3.1 g/dL — ABNORMAL LOW (ref 3.5–5.0)
ALKALINE PHOSPHATASE: 112 U/L (ref 38–126)
ALKALINE PHOSPHATASE: 102 U/L (ref 38–126)
ALT (SGPT): 439 U/L — ABNORMAL HIGH (ref 15–48)
ALT (SGPT): 402 U/L — ABNORMAL HIGH (ref 15–48)
ALT (SGPT): 355 U/L — ABNORMAL HIGH (ref 15–48)
ANION GAP: 8 mmol/L (ref 7–15)
ANION GAP: 8 mmol/L (ref 7–15)
AST (SGOT): 171 U/L — ABNORMAL HIGH (ref 14–38)
AST (SGOT): 99 U/L — ABNORMAL HIGH (ref 14–38)
BILIRUBIN TOTAL: 6.9 mg/dL — ABNORMAL HIGH (ref 0.0–1.2)
BILIRUBIN TOTAL: 6.6 mg/dL — ABNORMAL HIGH (ref 0.0–1.2)
BILIRUBIN TOTAL: 6.4 mg/dL — ABNORMAL HIGH (ref 0.0–1.2)
BLOOD UREA NITROGEN: 21 mg/dL (ref 7–21)
BLOOD UREA NITROGEN: 19 mg/dL (ref 7–21)
BUN / CREAT RATIO: 19
BUN / CREAT RATIO: 19
BUN / CREAT RATIO: 20
CALCIUM: 8.5 mg/dL (ref 8.5–10.2)
CALCIUM: 8.3 mg/dL — ABNORMAL LOW (ref 8.5–10.2)
CHLORIDE: 100 mmol/L (ref 98–107)
CHLORIDE: 102 mmol/L (ref 98–107)
CO2: 23 mmol/L (ref 22.0–30.0)
CO2: 25 mmol/L (ref 22.0–30.0)
CO2: 23 mmol/L (ref 22.0–30.0)
CREATININE: 0.93 mg/dL (ref 0.60–1.00)
CREATININE: 1.09 mg/dL — ABNORMAL HIGH (ref 0.60–1.00)
CREATININE: 0.96 mg/dL (ref 0.60–1.00)
EGFR CKD-EPI AA FEMALE: 77 mL/min/{1.73_m2} (ref >=60)
EGFR CKD-EPI AA FEMALE: 64 mL/min/{1.73_m2} (ref >=60)
EGFR CKD-EPI AA FEMALE: 74 mL/min/{1.73_m2} (ref >=60)
EGFR CKD-EPI NON-AA FEMALE: 67 mL/min/{1.73_m2} (ref >=60)
EGFR CKD-EPI NON-AA FEMALE: 55 mL/min/{1.73_m2} — ABNORMAL LOW (ref >=60)
EGFR CKD-EPI NON-AA FEMALE: 64 mL/min/{1.73_m2} (ref >=60)
GLUCOSE RANDOM: 139 mg/dL (ref 65–179)
GLUCOSE RANDOM: 137 mg/dL (ref 65–179)
GLUCOSE RANDOM: 163 mg/dL (ref 65–179)
POTASSIUM: 2.8 mmol/L — ABNORMAL LOW (ref 3.5–5.0)
POTASSIUM: 2.7 mmol/L — ABNORMAL LOW (ref 3.5–5.0)
POTASSIUM: 5.2 mmol/L — ABNORMAL HIGH (ref 3.5–5.0)
PROTEIN TOTAL: 5.7 g/dL — ABNORMAL LOW (ref 6.5–8.3)
PROTEIN TOTAL: 5.4 g/dL — ABNORMAL LOW (ref 6.5–8.3)
PROTEIN TOTAL: 5.5 g/dL — ABNORMAL LOW (ref 6.5–8.3)
SODIUM: 134 mmol/L — ABNORMAL LOW (ref 135–145)
SODIUM: 134 mmol/L — ABNORMAL LOW (ref 135–145)
SODIUM: 133 mmol/L — ABNORMAL LOW (ref 135–145)

## 2018-08-04 LAB — RED CELL DISTRIBUTION WIDTH: Lab: 14.6

## 2018-08-04 LAB — RENAL FUNCTION PANEL
ALBUMIN: 2.9 g/dL — ABNORMAL LOW (ref 3.5–5.0)
ANION GAP: 8 mmol/L (ref 7–15)
BLOOD UREA NITROGEN: 21 mg/dL (ref 7–21)
BUN / CREAT RATIO: 19
CALCIUM: 8 mg/dL — ABNORMAL LOW (ref 8.5–10.2)
CHLORIDE: 99 mmol/L (ref 98–107)
CREATININE: 1.09 mg/dL — ABNORMAL HIGH (ref 0.60–1.00)
EGFR CKD-EPI AA FEMALE: 64 mL/min/{1.73_m2} (ref >=60)
EGFR CKD-EPI NON-AA FEMALE: 55 mL/min/{1.73_m2} — ABNORMAL LOW (ref >=60)
GLUCOSE RANDOM: 126 mg/dL (ref 65–179)
PHOSPHORUS: 6.4 mg/dL — ABNORMAL HIGH (ref 2.9–4.7)
POTASSIUM: 4.1 mmol/L (ref 3.5–5.0)
SODIUM: 132 mmol/L — ABNORMAL LOW (ref 135–145)

## 2018-08-04 LAB — CMV DNA, QUANTITATIVE, PCR

## 2018-08-04 LAB — MAGNESIUM
Magnesium:MCnc:Pt:Ser/Plas:Qn:: 1.2 — ABNORMAL LOW
Magnesium:MCnc:Pt:Ser/Plas:Qn:: 1.2 — ABNORMAL LOW

## 2018-08-04 LAB — BUN / CREAT RATIO: Urea nitrogen/Creatinine:MRto:Pt:Ser/Plas:Qn:: 19

## 2018-08-04 LAB — CMV COMMENT: Lab: 0

## 2018-08-04 LAB — POTASSIUM URINE: Lab: 71.6

## 2018-08-04 LAB — PHOSPHORUS
Phosphate:MCnc:Pt:Ser/Plas:Qn:: 2.3 — ABNORMAL LOW
Phosphate:MCnc:Pt:Ser/Plas:Qn:: 2.6 — ABNORMAL LOW
Phosphate:MCnc:Pt:Ser/Plas:Qn:: 6.4 — ABNORMAL HIGH

## 2018-08-04 LAB — TACROLIMUS, TROUGH: Lab: 4.8 — ABNORMAL LOW

## 2018-08-04 LAB — CREATININE, URINE: Lab: 123.5

## 2018-08-04 LAB — CHLORIDE URINE: Lab: 22

## 2018-08-04 LAB — CALCIUM: Calcium:MCnc:Pt:Ser/Plas:Qn:: 8 — ABNORMAL LOW

## 2018-08-04 LAB — MAGNESIUM URINE: Lab: 13

## 2018-08-04 LAB — AST (SGOT): Aspartate aminotransferase:CCnc:Pt:Ser/Plas:Qn:: 225 — ABNORMAL HIGH

## 2018-08-04 LAB — BLOOD UA

## 2018-08-04 LAB — AMYLASE: Chemistry studies:Cmplx:-:^Patient:Set:: 237 — ABNORMAL HIGH

## 2018-08-04 LAB — SODIUM URINE: Lab: 107

## 2018-08-04 LAB — LIPASE: Triacylglycerol lipase:CCnc:Pt:Ser/Plas:Qn:: 1174 — ABNORMAL HIGH

## 2018-08-04 LAB — PLATELET COUNT: Lab: 74 — ABNORMAL LOW

## 2018-08-04 NOTE — Unmapped (Signed)
-[facility] (ADVANCED ENDOSCOPY/BILIARY TEAM) CONSULTATION NOTE    Sara Sanders is 62 y.o. female being seen in consultation at the request of Dr. Doyce Loose for cholangitis.     Assessment and Plan:  Sara Sanders is a 62 y.o. 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 We have been consulted with concerns for cholangitis. The patient was directly admitted two days ago for a persistently elevated ALT and expedited liver biopsy (performed yesterday, pending) but overnight she spiked a fever and her bili this morning acutely rose to 6. She had a liver ultrasound today that showed a thickening of the CBD wall extending intrahepatically, likely related to cholangitis or cholangiopathy.  She was started on Zosyn. We will plan to perform an ERCP, tentatively today to evaluate for and relieve any biliary obstruction.     Recommendations:   - NPO for ERCP, tentatively today 10/23  - Recommendations reviewed with primary team.    Thank you for this consult. Case reviewed and discussed with Dr. Council Mechanic, who is in agreement with the plan. Please page the GI Biliary consult pager at [phone] with any further questions.     Oswaldo Conroy, FNP-C  Division of Gastroenterology  Advanced Therapeutic Endoscopy/Biliary Team    Chief Complaint: fevers, jaundice      History of Presenting Illness: Sara Sanders is a 62 y.o. 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 Her post-operative course has been complicated by an RA flare, maintained on prednisone, and elevated ALT (up into the 300s). A recent biopsy on 07/20/18 was indeterminate for acute cellular rejection. She was admitted from clinic on 08/02/18 due to persistently elevated liver enzymes in the setting of prednisone for expedited liver biopsy and ultrasound. She underwent transjugular liver biopsy yesterday (pending). Overnight she spiked a fever and her bili acutely rose to 6. Liver US on 10/21 did have some biliary ductal dilatation. Repeat liver US today concerning for cholangitis.     At present her vitals are stable. She is feeling ok besides being fatigued and anxious. She is jaundiced.     Past Medical History:   Diagnosis Date   ??? Apnea, sleep    ??? Arthritis     rheumatoid   ??? Cirrhosis (CMS-HCC)    ??? Disease of thyroid gland    ??? GERD (gastroesophageal reflux disease)    ??? Hepatitis, autoimmune (CMS-HCC)    ??? History of transfusion    ??? Iron deficiency    ??? Pneumonia    ??? Urinary incontinence        Past Surgical History:   Procedure Laterality Date   ??? BLADDER SUSPENSION  1993   ??? CHOLESTEATOMA EXCISION Left     EAR   ??? ear surgeries Bilateral     various   ??? HYSTERECTOMY     ??? PR NEEDLE BIOPSY LIVER N/A 07/20/2018    Procedure: BIOPSY OF LIVER, NEEDLE; PERCUTANEOUS;  Surgeon: Pia Mau, MD;  Location: [facility];  Service: Gastroenterology   ??? PR TRANSPLANT LIVER,ALLOTRANSPLANT N/A 04/27/2018    Procedure: LIVER ALLOTRANSPLANTATION; ORTHOTOPIC, PARTIAL OR WHOLE, FROM CADAVER OR LIVING DONOR, ANY AGE;  Surgeon: Florene Glen, MD;  Location: MAIN OR  [facility];  Service: Transplant   ??? PR TRANSPLANT,PREP DONOR LIVER, WHOLE N/A 04/27/2018    Procedure: [facility] STD PREP CAD DONOR WHOLE LIVER GFT PRIOR TNSPLNT,INC CHOLE,DISS/REM SURR TISSU WO TRISEG/LOBE SPLT;  Surgeon: Florene Glen, MD;  Location: MAIN OR [facility];  Service: Transplant   ??? PR UPPER GI ENDOSCOPY,DIAGNOSIS N/A 07/09/2017    Procedure: UGI ENDO, INCLUDE ESOPHAGUS, STOMACH, & DUODENUM &/OR JEJUNUM; DX W/WO COLLECTION SPECIMN, BY BRUSH OR WASH;  Surgeon: Pia Mau, MD;  Location: [facility];  Service: Gastroenterology         Allergies: No Known Allergies      MEDICATIONS    Current Facility-Administered Medications:   ???  acetaminophen (TYLENOL) tablet 650 mg, 650 mg, Oral, Q6H PRN, Merlyn Lot, MD, 650 mg at 08/04/18 0403  ???  calcium carbonate (OS-CAL) tablet 600 mg of elem calcium, 600 mg of elem calcium, Oral, Daily, Belva Chimes [place], [place], 600 mg of elem calcium at 08/04/18 0814  ???  cholecalciferol (vitamin D3) tablet 2,000 Units, 2,000 Units, Oral, Daily, Belva Chimes [place], [place], 2,000 Units at 08/04/18 0810  ???  docusate sodium (COLACE) capsule 100 mg, 100 mg, Oral, BID, Emelia Loron, MD, 100 mg at 08/04/18 1308  ???  folic acid (FOLVITE) tablet 1 mg, 1 mg, Oral, Daily, Belva Chimes [place], [place], 1 mg at 08/04/18 1135  ???  levothyroxine (SYNTHROID, LEVOTHROID) tablet 75 mcg, 75 mcg, Oral, Daily, Belva Chimes [place], [place], 75 mcg at 08/04/18 0501  ???  mycophenolate (CELLCEPT) tablet 500 mg, 500 mg, Oral, BID, Belva Chimes [place], [place], 500 mg at 08/04/18 0501  ???  pantoprazole (PROTONIX) EC tablet 40 mg, 40 mg, Oral, BID, Belva Chimes [place], [place], 40 mg at 08/04/18 6578  ???  piperacillin-tazobactam (ZOSYN) IVPB (premix) 3.375 g, 3.375 g, Intravenous, Q6H, Belva Chimes [place], [place], Last Rate: 12.5 mL/hr at 08/04/18 1439, 3.375 g at 08/04/18 1439  ???  polyethylene glycol (MIRALAX) packet 17 g, 17 g, Oral, Daily PRN, Emelia Loron, MD  ???  predniSONE (DELTASONE) tablet 40 mg, 40 mg, Oral, Daily, Belva Chimes [place], [place], 40 mg at 08/04/18 4696  ???  sodium chloride (NS) 0.9 % infusion, 75 mL/hr, Intravenous, Continuous, Emelia Loron, MD  ???  sulfamethoxazole-trimethoprim (BACTRIM,SEPTRA) 400-80 mg tablet 80 mg of trimethoprim, 1 tablet, Oral, Q MWF, Belva Chimes [place], [place], 80 mg of trimethoprim at 08/04/18 2952  ???  tacrolimus (PROGRAF) 7 mg combo product, 7 mg, Oral, Nightly (2000), Belva Chimes [place], [place], 7 mg at 08/03/18 1754  ???  tacrolimus (PROGRAF) 8 mg combo product, 8 mg, Oral, Daily, Belva Chimes [place], [place], 8 mg at 08/04/18 0501  ???  thiamine (B-1) tablet 100 mg, 100 mg, Oral, Daily, Belva Chimes [place], [place], 100 mg at 08/04/18 1135  ???  traMADol (ULTRAM) tablet 50 mg, 50 mg, Oral, Q6H PRN, Drake Leach, PA  ???  ursodiol (ACTIGALL) capsule 300 mg, 300 mg, Oral, BID, Belva Chimes [place], [place], 300 mg at 08/04/18 8413  ???  valGANciclovir (VALCYTE) tablet 450 mg, 450 mg, Oral, Daily, Belva Chimes [place], [place], 450 mg at 08/04/18 2440    FAMILY HISTORY  Family History   Problem Relation Age of Onset   ??? Cancer Sister    ??? Heart disease Mother    ??? Bleeding Disorder Mother    ??? Hypertension Mother    ??? Heart disease Father        SOCIAL HISTORY  Social History  Tobacco Use   ??? Smoking status: Former Smoker     Types: Cigarettes     Last attempt to quit: 1989     Years since quitting: 30.8   ??? Smokeless tobacco: Never Used   Substance Use Topics   ??? Alcohol use: No     Alcohol/week: 0.0 standard drinks       Review of Systems:  Constitutional: + fever, no chills, or weight loss  Eyes: + jaundice  Cardiovascular: no chest pain, no orthopnea  Respiratory: no shortness of breath, no cough  Gastrointestinal: as per the HPI  Musculoskeletal: no arthralgias, no myalgias  Integumentary: + jaundice, no pruritis  Neurological:  no confusion    Remainder as per HPI     Physical Exam  Vitals: Temp:  [36.3 ??C (97.3 ??F)-39.2 ??C (102.6 ??F)] 36.3 ??C (97.3 ??F)  Heart Rate:  [82-120] 82  Resp:  [16-18] 16  BP: (84-137)/(49-73) 102/51  SpO2:  [92 %-99 %] 94 %      Constitutional:   Alert, oriented x 3, no acute distress, well nourished, and well hydrated.   Psychiatric:   Thought organized, appropriate affect, pleasantly interactive, not anxious appearing.   HEENT:   PERRL, conjunctiva clear, + scleral icterus    Respiratory: Clear to auscultation, unlabored breathing.     Cardiovascular: regular rate and rhythm, normal S1 and S2, no murmur.     Gastrointestinal: Soft, normal bowel sounds, non-distended, non-tender, no organomegaly or masses.     Genitourinary Not performed.     Musculoskeletal: No joint swelling or tenderness noted, no deformities.     Skin: No rashes, + jaundice   Neurological: No focal deficits.        Labs:  Lab Results   Component Value Date    ALKPHOS 102 08/04/2018    BILITOT 6.6 (H) 08/04/2018    BILIDIR 0.30 08/02/2018    PROT 5.4 (L) 08/04/2018    ALBUMIN 2.9 (L) 08/04/2018    ALT 402 (H) 08/04/2018    AST 171 (H) 08/04/2018     Lab Results   Component Value Date    WBC 8.3 08/04/2018    HGB 11.8 (L) 08/04/2018    HCT 35.2 (L) 08/04/2018    PLT 87 (L) 08/04/2018       Imaging:   US Liver Transplant 10/23  IMPRESSION:  1.Patent hepatic transplant vasculature.  2.Interval decrease of hepatic artery resistive indices, low-low normal.  3.Echogenic thickening of the common bile duct wall extending intrahepatically, likely related to cholangitis or cholangiopathy.     US Liver Transplant 10/21  ??IMPRESSION:  1.Patent hepatic transplant vasculature.  2.Elevated resistive indices in the hepatic transplant arteries, increased from prior.  3.Improved hepatomegaly.  4.Small fluid collection along the posterior aspect of the right hepatic lobe.   5.Mildly dilated left intrahepatic bile ducts and visualized common bile duct with wall thickening, measuring up to 1.1 cm, increased from prior.

## 2018-08-04 NOTE — Unmapped (Signed)
VSS. Post biopsy, occlusive dressing in place.   Pt reports no pain so far this shift.  Adequate urine output,  Tolerating regular diet.    Problem: Adult Inpatient Plan of Care  Goal: Plan of Care Review  Outcome: Progressing  Goal: Patient-Specific Goal (Individualization)  Outcome: Progressing  Goal: Absence of Hospital-Acquired Illness or Injury  Outcome: Progressing  Goal: Optimal Comfort and Wellbeing  Outcome: Progressing  Goal: Readiness for Transition of Care  Outcome: Progressing  Goal: Rounds/Family Conference  Outcome: Progressing     Problem: Pain Acute  Goal: Optimal Pain Control  Outcome: Progressing     Problem: Infection  Goal: Infection Symptom Resolution  Outcome: Progressing     Problem: Hypertension Comorbidity  Goal: Blood Pressure in Desired Range  Outcome: Progressing

## 2018-08-04 NOTE — Unmapped (Signed)
Nephrology Consult Note    Assessment/Recommendations:  Sara Sanders is a 62 y.o. 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 Her post-operative course has been complicated by an RA flare, and C. Diff that has now resolved. A recent liver biopsy on 07/20/18 was indeterminate for acute cellular rejection (RAI = 2/9). She was admitted to [facility] from clinic on 08/02/18 due to persistently elevated liver enzymes in the setting of prednisone for expedited liver biopsy (10/22) and ultrasound.     Today nephrology is consulted due to new onset hypokalemia, hypomagnesemia, and hypophosphatemia, in the setting of acute increase in her Tot Billi levels, and LFTs. Urine electrolyte studies revealed high levels of potassium and magnesium in the urine, consistent with renal losses of electrolytes. Creatinine levels remain stable around her baseline.    Hypokalemia can be seen in the setting of GI losses, but this is not the case in this patient, as she denies a recent history of diarrhea or vomiting. Hypokalemia due to cellular shifts is also unlikely, as this patient is not on insuline, or a beta-blocker, and there are no signs of metabolic alkalosis. Acute drop in potassium, magnesium, and phosphorus levels is seen in the setting of refeeding syndrome, but this is unlikely in this patient as it is inconsistent with her history. Via this process of elimination, renal losses of K, Mg and Ph is the most likely cause of the derangement observed in serum electrolyte. Distal Renal Tubule acidosis can result in hypokalemia, however, acidosis is not established in this patient. Tacrolimus can cause increasing renal losses of Mg, resulting in hypomagnesemia, which in turn can cause hypokalemia. However, Mg levels of 1.2 in unlikely to cause a drop in potassium levels to the 2.7 range.   In the setting of the acute rise in total bilirubin levels to 6.9 on 10/23 from 0.4 on 10/22, and a possible concominant liver injury, an increased sympathetic tone and the resuling activation of the beta-2 adrenergic receptors promotes the uptake of potassium by the cells and can explain her acute hypokalemia.    We Recommend:  - Continue to monitor electrolyte levels and replete as needed  - Strict I/O's  - Avoid nephrotoxic drugs including but not limited to NSAIDs, contrasted studies, and fleets enemas   - Dose all meds for creatinine clearance < 40  ml/min   - Unless absolutely necessary, no MRIs with gadolinium given potential risk for NSF.     Thank you for this consult.  Nephrology will continue to follow along with the primary team.  Please page the renal fellow on call with questions/concerns.    Patient was seen and discussed with Dr. Jackey Loge, who is in agreement with the plan.    Requesting Attending Physician :  Doyce Loose,*  Service Requesting Consult : [facility] - transplant    Reason for Consult: Electrolyte derangement    HISTORY OF PRESENT ILLNESS:    Sara Sanders is a 62 y.o. 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 Her post-operative course has been complicated by an RA flare, maintained on prednisone, back pain w/ compression fracture, and C. Diff colitis s/p therapy,  and elevated ALT (up into the 300s). A recent biopsy  on 07/20/18 was indeterminate for acute cellular rejection (RAI = 2/9). She was admitted from clinic on 08/02/18 due to persistently elevated liver enzymes in the setting of prednisone for expedited liver biopsy and ultrasound.  Trans-jugular liver biopsy done here with VIT (10/22), with pathology results pending.  A repeat hepatic US showed elevated RIs and a mildly dilated L intrahepatic bile duct and CBD with wall thickening and dilation to 1.1cm.    Medications:   ??? calcium carbonate  600 mg of elem calcium Oral Daily   ??? cholecalciferol (vitamin D3)  2,000 Units Oral Daily   ??? docusate sodium  100 mg Oral BID   ??? folic acid  1 mg Oral Daily   ??? levothyroxine  75 mcg Oral Daily   ??? magnesium sulfate  2 g Intravenous Q2H   ??? mycophenolate  500 mg Oral BID   ??? pantoprazole  40 mg Oral BID   ??? piperacillin-tazobactam  3.375 g Intravenous Q6H   ??? potassium chloride in water  10 mEq Intravenous Q1H   ??? potassium phosphate IVPB  30 mmol Intravenous Once   ??? predniSONE  40 mg Oral Daily   ??? sulfamethoxazole-trimethoprim  1 tablet Oral Q MWF   ??? tacrolimus  7 mg Oral Nightly (2000)   ??? tacrolimus  8 mg Oral Daily   ??? thiamine  100 mg Oral Daily   ??? ursodiol  300 mg Oral BID   ??? valGANciclovir  450 mg Oral Daily        ALLERGIES  Patient has no known allergies.    MEDICAL HISTORY  Past Medical History:   Diagnosis Date   ??? Apnea, sleep    ??? Arthritis     rheumatoid   ??? Cirrhosis (CMS-HCC)    ??? Disease of thyroid gland    ??? GERD (gastroesophageal reflux disease)    ??? Hepatitis, autoimmune (CMS-HCC)    ??? History of transfusion    ??? Iron deficiency    ??? Pneumonia    ??? Urinary incontinence         SOCIAL HISTORY  Social History     Socioeconomic History   ??? Marital status: Married     Spouse name: Not on file   ??? Number of children: Not on file   ??? Years of education: Not on file   ??? Highest education level: Not on file   Occupational History   ??? Not on file   Social Needs   ??? Financial resource strain: Not on file   ??? Food insecurity:     Worry: Not on file     Inability: Not on file   ??? Transportation needs:     Medical: Not on file     Non-medical: Not on file   Tobacco Use   ??? Smoking status: Former Smoker     Types: Cigarettes     Last attempt to quit: 1989     Years since quitting: 30.8   ??? Smokeless tobacco: Never Used   Substance and Sexual Activity   ??? Alcohol use: No Alcohol/week: 0.0 standard drinks   ??? Drug use: No   ??? Sexual activity: Yes     Partners: Male     Birth control/protection: Surgical   Lifestyle   ??? Physical activity:     Days per week: Not on file     Minutes per session: Not on file   ??? Stress: Not on file   Relationships   ??? Social connections:  Talks on phone: Not on file     Gets together: Not on file     Attends religious service: Not on file     Active member of club or organization: Not on file     Attends meetings of clubs or organizations: Not on file     Relationship status: Not on file   Other Topics Concern   ??? Exercise No   ??? Living Situation Yes     Comment: spouse   Social History Narrative   ??? Not on file        FAMILY HISTORY  Family History   Problem Relation Age of Onset   ??? Cancer Sister    ??? Heart disease Mother    ??? Bleeding Disorder Mother    ??? Hypertension Mother    ??? Heart disease Father       Code Status:  Full Code    Review of Systems:  A 12 system review of systems was negative except as noted in HPI.    Physical Exam:  Vitals:    08/04/18 0806   BP: 84/49   Pulse: 97   Resp: 16   Temp: 37.3 ??C   SpO2: 92%     No intake/output data recorded.    Intake/Output Summary (Last 24 hours) at 08/04/2018 0945  Last data filed at 08/04/2018 0806  Gross per 24 hour   Intake 480 ml   Output 1975 ml   Net -1495 ml       General: Alert, well appearing, no distress  HEENT: NCAT, EOMI, MMM   CV: Regular, normal S1/S2 heart sounds, no murmurs, no rubs.   PULM: Clear to auscultation bilaterally, no increased work of breathing  GI: Soft, active bowel sounds, nontender  EXTREMITIES: warm and well perfused    LABS    Creatinine Trend  Lab Results   Component Value Date    Creatinine 1.09 (H) 08/04/2018    Creatinine 0.93 08/04/2018    Creatinine 0.90 08/03/2018    Creatinine 1.05 (H) 08/02/2018    Creatinine 1.16 (H) 07/28/2018    Creatinine 1.05 (H) 07/26/2018    Creatinine 1.07 (H) 07/23/2018    Creatinine 0.70 03/17/2018    Creatinine 0.60 03/03/2018    Creatinine 0.75 02/23/2018    Creatinine 0.59 11/19/2017    Creatinine 0.63 10/14/2017    Creatinine 0.58 10/07/2017        Lab Results   Component Value Date    Color, UA Amber 08/04/2018    Glucose, UA Negative 08/04/2018    Ketones, UA Negative 08/04/2018    Blood, UA Small (A) 08/04/2018    Nitrite, UA Negative 08/04/2018   ]    Lab Results   Component Value Date    Sodium 134 (L) 08/04/2018    Sodium 133 (L) 03/17/2018    Potassium 2.7 (L) 08/04/2018    Potassium 3.6 03/17/2018    Chloride 101 08/04/2018    Chloride 93 (L) 03/17/2018    CO2 25.0 08/04/2018    CO2 27 03/17/2018    BUN 21 08/04/2018    BUN 6 (L) 03/17/2018    Creatinine 1.09 (H) 08/04/2018    Creatinine 0.70 03/17/2018   ]  Lab Results   Component Value Date    CALCIUM 8.3 (L) 08/04/2018    PHOS 2.6 (L) 08/04/2018       Recent Labs   Lab Units 08/04/18  0638   WBC 10*9/L 8.3   HEMOGLOBIN g/dL 21.3*  HEMATOCRIT % 35.2*   PLATELET COUNT (1) 10*9/L 87*

## 2018-08-04 NOTE — Unmapped (Signed)
MRI Screening Form needs to be filled out in EPIC in order to proceed with examination.

## 2018-08-04 NOTE — Unmapped (Signed)
Transplant Surgery Progress Note    Service Date: 08/04/2018  Admit Date: 08/02/2018, Hospital Day: 3  Hospital Service: Surg Transplant [facility])  Attending: Doyce Loose,*    Assessment     Sara Sanders is a 62 y.o. 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 Her post-operative course has been complicated by an RA flare, maintained on prednisone, and elevated ALT (up into the 300s). A recent biopsy on 07/20/18 was indeterminate for acute cellular rejection (RAI = 2/9). She was admitted from clinic on 08/02/18 due to persistently elevated liver enzymes in the setting of prednisone for expedited liver biopsy and ultrasound.     Interval Events   Overnight, patient became febrile to 39.2. Full fever workup including chest xray, UA, urine culture, and blood cultures x2 were ordered. Reports some dysuria and rigors/chills. Denies any nausea/vomiting. Ambulated in the halls yesterday.     Plan     - prn tylenol, tramadol 50  - SORA, encourage IS and ambulation  - regular diet, medlocked  - replete potassium and magnesium this morning  - protonix, bowel regimen (colace, miralax)  - home synthroid daily  - awaiting pathology read on liver biopsy  - liver ultrasound done 10/23 showed patent hepatic vasculature, interval decrease of hepatic artery resistive indices (low-low normal), and echogenic thickening of the CBD extending intrahepatically, likely related to cholangitis or cholangiopathy   - ordered MRCP and MRI/MRV follow up to liver ultrasound; plan for ERCP today  - zosyn (10/23 - ) in response to fever to 39.2       - UA positive for small leukocyte esterase, protein, urobilinogen/bilirubin, blood, WBC, RBC, and some bacteria mucus; urine culture pending        - blood cultures x2 pending        - CXR showed linear bandlike atelectasis in the RLL and associated right hemidiaphragm elevation  - prograf, cellcept, valcyte, prednisone 40mg  daily, bactrim      Prophylaxis:  - DVT: heparin (subcutaneous). SCDs.     Disposition: Floor status.   -PT/OT recs: none  -Home health needs: pending      Objective     Vitals:   Temp:  [36.3 ??C-39.2 ??C] 36.3 ??C  Heart Rate:  [82-120] 82  Resp:  [16-18] 16  BP: (84-137)/(49-73) 102/51  MAP (mmHg):  [97] 97  SpO2:  [92 %-99 %] 94 %    Physical Exam:  -General:  Appropriate, comfortable and in no apparent distress.   -Neurological: Moves all 4 extremities spontaneously.   -Cardiovascular: HDS  -Pulmonary: Normal work of breathing.   -Abdomen: Soft, non-tender, non-distended. No rebound or guarding. Well healed Mercedes incision.  -Extremities: Warm, well perfused.     Recent Imaging:  Xr Chest Portable    Result Date: 08/04/2018  EXAM: XR CHEST PORTABLE DATE: 08/04/2018 8:06 AM ACCESSION: [id number] UN DICTATED: 08/04/2018 9:33 AM INTERPRETATION LOCATION: [facility]     CLINICAL INDICATION: 62 years old Female with FEVER      COMPARISON: 05/17/2018     TECHNIQUE: Portable Chest Radiograph.     FINDINGS:     Linear bandlike atelectasis in the right lower lung zone and associated elevation of right hemidiaphragm.     No consolidation, pleural effusion or pneumothorax     Stable cardiomediastinal silhouette.  Linear bandlike atelectasis in the right lower lung zone and associated elevation of right hemidiaphragm.    US Liver Transplant    Result Date: 08/04/2018  EXAM: US LIVER TRANSPLANT DATE: 08/04/2018 9:34 AM ACCESSION: [id number] UN DICTATED: 08/04/2018 9:40 AM INTERPRETATION LOCATION: [facility]     CLINICAL INDICATION: 62 years old Female with increased LFTs, possible rejection      TECHNIQUE: Limited ultrasound of the liver transplant and liver vasculature was obtained with grayscale, color Doppler and spectral Doppler imaging.     COMPARISON: 08/02/2018.     FINDINGS: HEPATOBILIARY: The liver is normal in echotexture. No definite liver lesions. No intrahepatic biliary ductal dilatation. The common bile duct is normal in caliber. However, there is marked diffuse thickening and increased echogenicity of the wall of the common bile duct measuring up to 4 mm in maximal thickness, extending to the central intrahepatic bile ducts. The gallbladder is surgically absent.     VESSELS: - Portal vein: The main, left and right portal veins are patent with hepatopetal flow. Normal portal vein velocity ( 0.20 m/s or greater) - Splenic vein: Patent, with hepatopetal flow. - Hepatic veins/IVC: The IVC, left, middle and right hepatic veins are patent with triphasic waveforms. - Hepatic artery: Patent with interval decrease of resistive indices, now low-low normal. - Proximal aorta:  not well-visualized     OTHER: No ascites.         1.Patent hepatic transplant vasculature. 2.Interval decrease of hepatic artery resistive indices, low-low normal. 3.Echogenic thickening of the common bile duct wall extending intrahepatically, likely related to cholangitis or cholangiopathy.     Please see below for data measurements:     Liver: 20.1 cm, previously 15.8 cm     Main portal vein diameter: 0.66 cm, previously 0.6 cm     Main portal vein velocity pre anastomosis velocity: 0.21 m/s, previously 0.35 Main portal vein velocity anastomosis velocity: 0.35 m/s, previously 0.56 Main portal vein velocity post anastomosis velocity: 0.29 m/s, previously 0.49 Anterior right portal vein velocity: 0.3 m/s, previously 0.23 Posterior right portal vein velocity: 0.3 m/s, previously 0.19 Left portal vein velocity: 0.17 m/s, previously 0.23     Common hepatic artery resistive index: 0.45 and systolic acceleration time 8 msec, previously 0.93 and 33 respectively Right hepatic artery resistive index: 0.58 and systolic acceleration time 17 msec, previously 0.83 and 17 respectively Left hepatic artery resistive index: 0.51 and systolic acceleration time 17 msec, previously 0.87 and 25 respectively     Left hepatic vein flow: triphasic Middle hepatic vein flow: triphasic Right hepatic vein flow: triphasic Inferior vena cava flow: triphasic     Splenic vein midline flow: hepatopetal Splenic vein proximal flow: hepatopetal     Abdominal free fluid visualized: no    US Liver Transplant    Result Date: 08/03/2018  EXAM: US LIVER TRANSPLANT DATE: 08/02/2018 8:34 PM ACCESSION: [id number] UN DICTATED: 08/02/2018 8:42 PM INTERPRETATION LOCATION: [facility]     CLINICAL INDICATION: 62 years old Female with elevated LFTs      TECHNIQUE: Ultrasound views of the complete abdomen were obtained using gray scale and color and spectral Doppler imaging.     COMPARISON: Prior liver transplant ultrasounds, the most recent dated 05/02/2018 FINDINGS:     HEPATOBILIARY: The liver is normal in echogenicity. No focal hepatic lesions. Mild intrahepatic biliary ductal dilatation in the left hepatic lobe. The proximal common bile duct is mildly dilated, measuring 1.1 cm, previously  6mm. The gallbladder is surgically absent.  PANCREAS: Visualized portion is unremarkable.     SPLEEN: Normal in size and echotexture.     KIDNEYS: Normal in size and echotexture. No solid masses or calculi. No hydronephrosis.     VESSELS:     - Portal vein: The main, left and right portal veins are patent with hepatopetal flow. Normal main portal vein velocity, mildly decreased from prior ( 0.20 m/s or greater). - Splenic vein: Patent, with hepatopetal flow. - Hepatic veins/IVC: The IVC, left, middle and right hepatic veins are patent with bi/triphasic waveforms. - Hepatic artery: Patent with increased resistive indices when compared to prior. -Partially visualized proximal aorta:  unremarkable     OTHER: Small perihepatic fluid collection, posterior to the right hepatic lobe and superior to the right kidney, measuring approximately 1.7 x 1.9 x 0.8 cm. Previously identified perihepatic fluid collections seen on the prior study are not visualized on the current study.         1.Patent hepatic transplant vasculature. 2.Elevated resistive indices in the hepatic transplant arteries, increased from prior. 3.Improved hepatomegaly. 4.Small fluid collection along the posterior aspect of the right hepatic lobe. 5.Mildly dilated left intrahepatic bile ducts and visualized common bile duct with wall thickening, measuring up to 1.1 cm, increased from prior.      Please see below for data measurements: Liver: 15.8 cm, previously 20 cm Common bile duct: 1.1 cm, previously 5.7 mm     Right kidney length: 8.7 cm Left kidney length: 8.7 cm     Main portal vein diameter: 0.6 cm, previously 1 cm     Main portal vein pre anastomosis velocity: 0.35 m/s, previously 0.53 m/s Main portal vein anastomosis velocity: 0.56 m/s, previously 1.11 m/s Main portal vein post anastomosis velocity: 0.49 m/s, previously 0.68 m/s Anterior right portal vein velocity: 0.23 m/s, previously 0.51 m/s Posterior right portal vein velocity: 0.19 m/s, previously 0.31 m/s Left portal vein velocity: 0.23 m/s, previously 0.22 m/s     Right portal vein flow: hepatopetal, unchanged Left portal vein flow: hepatopetal, change     Common hepatic artery resistive index: 0.93 and systolic acceleration time 33 msec, previously 0.72 and 46 respectively Right hepatic artery resistive index: 0.83 and systolic acceleration time 17 msec, previously 0.61 and 25 respectively Left hepatic artery resistive index: 0.87 and systolic acceleration time 25 msec, previously 0.57 and 25 respectively     Left hepatic vein flow: triphasic Middle hepatic vein flow: triphasic Right hepatic vein flow: triphasic Inferior vena cava flow: biphasic     Splenic vein midline flow: hepatopetal Splenic vein proximal flow: hepatopetal     Aorta: partially visualized Inferior vena cava: partially visualized     Spleen: 11.3 cm, previously 13.4 cm     Abdominal free fluid visualized: no

## 2018-08-04 NOTE — Unmapped (Signed)
MRI Screening Form needed- please fill out all questions and list all surgeries/implants. Pt must be changed into a gown. Thank you!

## 2018-08-04 NOTE — Unmapped (Signed)
SRF TEACHING ROUNDS    An interdisciplinary care conference was held today and included the following team members: Aleen Campi, MD Transplant Surgeon, [facility] Surgery Resident , Drake Leach, [place] Transplant Physician Assistant, [place] Neighbor, RN Transplant Nurse Coordinator, Toy Care, PharmD Transplant Pharmacist, Sibyl Parr, LCSW Transplant Case Manager, Jackqulyn Livings, RD Transplant Dietician and [place] Car[place], MD Transplant Nephrologist.    Per team, spiked a fever overnight. Bili is up, K and mag are low. U/s showed bile duct dilation. Repeat u/s this morning. Team will consult GI. Path results pending.     Discharge Plan: no date      Sibyl Parr, [place], [place]  Transplant Case Manager  [facility] for Transplant Care

## 2018-08-05 LAB — CBC
HEMATOCRIT: 34.6 % — ABNORMAL LOW (ref 36.0–46.0)
HEMOGLOBIN: 11.1 g/dL — ABNORMAL LOW (ref 12.0–16.0)
MEAN CORPUSCULAR HEMOGLOBIN CONC: 32.1 g/dL (ref 31.0–37.0)
MEAN CORPUSCULAR HEMOGLOBIN: 31.8 pg (ref 26.0–34.0)
MEAN CORPUSCULAR VOLUME: 98.8 fL (ref 80.0–100.0)
MEAN PLATELET VOLUME: 7.6 fL (ref 7.0–10.0)
RED BLOOD CELL COUNT: 3.5 10*12/L — ABNORMAL LOW (ref 4.00–5.20)
WBC ADJUSTED: 5.7 10*9/L (ref 4.5–11.0)

## 2018-08-05 LAB — MEAN CORPUSCULAR HEMOGLOBIN CONC: Lab: 32.1

## 2018-08-05 LAB — COMPREHENSIVE METABOLIC PANEL
ALBUMIN: 2.7 g/dL — ABNORMAL LOW (ref 3.5–5.0)
ALKALINE PHOSPHATASE: 99 U/L (ref 38–126)
ALT (SGPT): 255 U/L — ABNORMAL HIGH (ref 15–48)
ANION GAP: 7 mmol/L (ref 7–15)
AST (SGOT): 56 U/L — ABNORMAL HIGH (ref 14–38)
BILIRUBIN TOTAL: 2 mg/dL — ABNORMAL HIGH (ref 0.0–1.2)
BLOOD UREA NITROGEN: 17 mg/dL (ref 7–21)
BUN / CREAT RATIO: 16
CALCIUM: 8 mg/dL — ABNORMAL LOW (ref 8.5–10.2)
CHLORIDE: 100 mmol/L (ref 98–107)
CO2: 24 mmol/L (ref 22.0–30.0)
CREATININE: 1.07 mg/dL — ABNORMAL HIGH (ref 0.60–1.00)
EGFR CKD-EPI AA FEMALE: 65 mL/min/{1.73_m2} (ref >=60)
GLUCOSE RANDOM: 88 mg/dL (ref 65–179)
POTASSIUM: 4.5 mmol/L (ref 3.5–5.0)
PROTEIN TOTAL: 4.9 g/dL — ABNORMAL LOW (ref 6.5–8.3)
SODIUM: 131 mmol/L — ABNORMAL LOW (ref 135–145)

## 2018-08-05 LAB — PHOSPHORUS: Phosphate:MCnc:Pt:Ser/Plas:Qn:: 3.8

## 2018-08-05 LAB — CO2: Carbon dioxide:SCnc:Pt:Ser/Plas:Qn:: 24

## 2018-08-05 LAB — MAGNESIUM: Magnesium:MCnc:Pt:Ser/Plas:Qn:: 2.3 — ABNORMAL HIGH

## 2018-08-05 LAB — TACROLIMUS, TROUGH: Lab: 12.4

## 2018-08-05 NOTE — Unmapped (Signed)
Vancomycin Therapeutic Monitoring Pharmacy Note    Sara Sanders is a 62 y.o. female starting vancomycin. Date of therapy initiation:08/04/18    Indication: Bacteremia/Sepsis    Prior Dosing Information: None/new initiation     Goals:  Therapeutic Drug Levels  Vancomycin trough goal: 15-20 mg/L    Additional Clinical Monitoring/Outcomes  Renal function, volume status (intake and output)    Results: Not applicable    Wt Readings from Last 1 Encounters:   08/03/18 50.7 kg (111 lb 12.8 oz)     Creatinine   Date Value Ref Range Status   08/04/2018 0.96 0.60 - 1.00 mg/dL Final   16/07/9603 5.40 (H) 0.60 - 1.00 mg/dL Final   98/08/9146 8.29 (H) 0.60 - 1.00 mg/dL Final        Pharmacokinetic Considerations and Significant Drug Interactions:  ? Adult (estimated initial): Vd = 36 L, ke = 0.0626 hr-1  ? Concurrent nephrotoxic meds: not applicable    Assessment/Plan:  Recommendation(s)  ? Start vancomycin 1000 mg x 1 then 750 mg q12h  ? Estimated trough on recommended regimen: 18 mg/L    Follow-up  ? Level due: prior to fourth or fifth dose.   ? A pharmacist will continue to monitor and order levels as appropriate    Please page service pharmacist with questions/clarifications.    Darlis Loan, PharmD

## 2018-08-05 NOTE — Unmapped (Signed)
NEW IMMUNOCOMPROMISED HOST INFECTIOUS DISEASE CONSULT NOTE      Sara Sanders is being seen in consultation at the request of Doyce Loose,* for evaluation of bacteremia.    Assessment/Recommendations:    NOA GALVAO is a 62 y.o. 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 Initially admitted for concern for rejection in setting of elevated ALT, biopsy on 07/20/18 was indeterminate for acute cellular rejection (RAI = 2/9), subsequent biopsy in setting of persistently elevated ALT not consistent with rejection, demonstrated significant congestion, ?suboptimal blood flow suspected. While inpatient developed acute cholangitis and blood cultures 2/2 with enterococcus gallinarum (10/23). 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      ID Problem List:  Autoimmune hepatitis s/p liver transplant 04/28/18, [facility] high risk donor  - Surgical complications: RA flare, maintained on pred  - Serologies: CMV D-/R+, EBV D-/R+  - Induction: steroids  - Immunosuppression: prednisone, tac mycohpenolate  - Prophylaxis: bactrim MWF, valgan  - Rejection history (delete if no h/o rejection)    Pertinent Exposure History  High risk donor, repeat HIV, Hep serologies negative one month post transplant (05/28/18)    Pertinent Co-morbidities  #RA  #HTN    #ALT elevation - 10/8  - no e/o infx, CMV serum neg (10/22)    Drug Intolerances  NKDA    Infection History  #acute cholangitis w enterococcal gallinarum bacteremia - 08/04/18  - 10/23 s/p ERCP w 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  Please page the ID Transplant/Liquid Oncology Fellow consult at [phone] with questions.  Patient discussed with Dr. Reynold Bowen.    Terrall Laity, MD, PhD  Fellow, [facility] Infectious Diseases  Pager:  [phone]        History of Present Illness:      Source of information includes:  Electronic Medical Records and Discussion with patient.  History obtained from:patient.    Sara Sanders is a 62 y.o. 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 Initially admitted for concern for rejection in setting of elevated ALT, biopsy on 07/20/18 was indeterminate for acute cellular rejection (RAI = 2/9), subsequent biopsy in setting of persistently elevated ALT not consistent with rejection, demonstrated significant congestion, ?suboptimal blood flow suspected. While inpatient hade fever and elevated T bili (6.9) from prior normal c/w acute cholangitis. Blood cultures 2/2 with enterococcus gallinarum (10/23). 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      Denies any infectious s/s prior to admission.     Allergies:  No Known Allergies    Medications:   Current antibiotics:  zosyn    Previous antibiotics:  vanc    Current/Prior immunomodulators:  Pred, tac, mycophenolate     Other medications reviewed.     Medical History:  Past Medical History:   Diagnosis Date   ???  Apnea, sleep    ??? Arthritis     rheumatoid   ??? Cirrhosis (CMS-HCC)    ??? Disease of thyroid gland    ??? GERD (gastroesophageal reflux disease)    ??? Hepatitis, autoimmune (CMS-HCC)    ??? History of transfusion    ??? Iron deficiency    ??? Pneumonia    ??? Urinary incontinence        Surgical History:  Past Surgical History:   Procedure Laterality Date   ??? BLADDER SUSPENSION  1993   ??? CHOLESTEATOMA EXCISION Left     EAR   ??? ear surgeries Bilateral     various   ??? HYSTERECTOMY     ??? PR NEEDLE BIOPSY LIVER N/A 07/20/2018    Procedure: BIOPSY OF LIVER, NEEDLE; PERCUTANEOUS;  Surgeon: Pia Mau, MD;  Location: [facility];  Service: Gastroenterology   ??? PR TRANSPLANT LIVER,ALLOTRANSPLANT N/A 04/27/2018    Procedure: LIVER ALLOTRANSPLANTATION; ORTHOTOPIC, PARTIAL OR WHOLE, FROM CADAVER OR LIVING DONOR, ANY AGE;  Surgeon: Florene Glen, MD;  Location: MAIN OR [facility];  Service: Transplant   ??? PR TRANSPLANT,PREP DONOR LIVER, WHOLE N/A 04/27/2018    Procedure: [facility] STD PREP CAD DONOR WHOLE LIVER GFT PRIOR TNSPLNT,INC CHOLE,DISS/REM SURR TISSU WO TRISEG/LOBE SPLT;  Surgeon: Florene Glen, MD;  Location: MAIN OR [facility];  Service: Transplant   ??? PR UPPER GI ENDOSCOPY,DIAGNOSIS N/A 07/09/2017    Procedure: UGI ENDO, INCLUDE ESOPHAGUS, STOMACH, & DUODENUM &/OR JEJUNUM; DX W/WO COLLECTION SPECIMN, BY BRUSH OR WASH;  Surgeon: Pia Mau, MD;  Location: [facility];  Service: Gastroenterology       Social History:  Tobacco use:   reports that she quit smoking about 30 years ago. Her smoking use included cigarettes. She has never used smokeless tobacco.   Alcohol use:    reports no history of alcohol use.   Drug use:    reports no history of drug use.   Living situation:  Lives with spouse/partner   Residence:   country   [place] place     [place] travel:   Has traveled to [place] this summer   International travel:   No travel outside of the [place] Engineer, maintenance service:  Has not served in the [employer]   Employment:  Unemployed and Previously employed as Development worker, community and animal exposure:  Animal exposures include dog   Insect exposure:  No tick exposure   Hobbies:  Denies unusual environmental exposures   TB exposures:  No known TB exposure and No family history of TB     Family History:  Family History Problem Relation Age of Onset   ??? Cancer Sister    ??? Heart disease Mother    ??? Bleeding Disorder Mother    ??? Hypertension Mother    ??? Heart disease Father        Review of Systems:  All other systems reviewed are negative.          Vital Signs last 24 hours:  Temp:  [35.9 ??C-37.4 ??C] 36.9 ??C  Heart Rate:  [75-97] 82  SpO2 Pulse:  [83-89] 83  Resp:  [16-18] 16  BP: (92-130)/(51-78) 105/56  SpO2:  [90 %-99 %] 99 %    Physical Exam:  Patient Lines/Drains/Airways Status    Active Active Lines, Drains, & Airways     Name:   Placement date:   Placement time:   Site:   Days:  Peripheral IV 08/02/18 Anterior;Proximal;Right Forearm   08/02/18    1525    Forearm   2    Peripheral IV 08/04/18 Left Antecubital   08/04/18    0610    Antecubital   1              Gen: pleasant woman in NAD, comfortable  HEENT: PERRL, jagged ulceration on L upper lip w exudate  LAD: no supraclavicular, cervical,  LAD  Pulm: CTAB, no crackles/wheezes  CV: RRR without M/R/G  Abd: soft/NT/ND +BS, well-healed incision   Ext: no LEE, LEs w/wp  Neuro: AAOx3, full sentences, no focal deficits.  Psych: normal mood/affect  Access: peripheral appears uninfected      Labs:    Lab Results   Component Value Date    WBC 5.7 08/05/2018    WBC 8.3 08/04/2018    WBC 2.1 (LL) 03/17/2018    WBC 2.2 (LL) 03/03/2018    HGB 11.1 (L) 08/05/2018    HGB 9.8 (L) 03/17/2018    HCT 34.6 (L) 08/05/2018    HCT 27.3 (L) 03/17/2018    Platelet 77 (L) 08/05/2018    Platelet 144 (L) 03/17/2018    Absolute Neutrophils 3.7 08/02/2018    Absolute Neutrophils 1.1 (L) 03/17/2018    Absolute Lymphocytes 0.8 (L) 08/02/2018    Absolute Lymphocytes 0.8 03/17/2018    Absolute Eosinophils 0.1 08/02/2018    Absolute Eosinophils 0.0 03/17/2018    Sodium 131 (L) 08/05/2018    Sodium 133 (L) 03/17/2018    Potassium 4.5 08/05/2018    Potassium 3.6 03/17/2018    BUN 17 08/05/2018    BUN 6 (L) 03/17/2018    Creatinine 1.07 (H) 08/05/2018    Creatinine 0.96 08/04/2018    Creatinine 0.70 03/17/2018 Creatinine 0.60 03/03/2018    Glucose 88 08/05/2018    Glucose 86 03/17/2018    Magnesium 2.3 (H) 08/05/2018    Albumin 2.7 (L) 08/05/2018    Total Bilirubin 2.0 (H) 08/05/2018    Total Bilirubin 3.6 (H) 03/17/2018    AST 56 (H) 08/05/2018    AST 65 (H) 03/17/2018    ALT 255 (H) 08/05/2018    ALT 31 03/17/2018    Alkaline Phosphatase 99 08/05/2018    Alkaline Phosphatase 87 03/17/2018    INR 0.97 08/03/2018    INR 1.6 (H) 03/17/2018    Total IgG 1,762 (H) 09/17/2015       Microbiology:  Past cultures were reviewed in Epic and CareEverywhere.    Review of cultures with microbiology: I discussed the microbiology with Dr. Hyacinth Meeker and Gretel Acre.    Imaging:    Independent visualization of images: I independently reviewed the image from (10/21) and I agree with the findings/interpretation. No biliary stent visualized      Serologies:  Lab Results   Component Value Date    CMV IGG Positive (A) 04/27/2018    EBV VCA IgG Antibody Positive (A) 04/27/2018    Hep A IgG Nonreactive 07/21/2017    Hep B S Ab Nonreactive 04/27/2018    Hep B Core Total Ab Nonreactive 04/27/2018    Hepatitis C Ab Nonreactive 04/27/2018    RPR Nonreactive 04/27/2018    HSV 1 IgG Positive (A) 04/27/2018    HSV 2 IgG Negative 04/27/2018    Varicella IgG Positive 04/27/2018    Rubella IgG Scr Positive 07/21/2017    Toxoplasma Gondii IgG Negative 04/27/2018    Quantiferon TB Gold Negative 07/21/2017    Quantiferon NIL Value 0.06  07/21/2017    Quantiferon Mitogen Minus Nil 9.17 07/21/2017    Quantiferon Antigen Minus Nil 0.00 07/21/2017       Immunizations:  Immunization History   Administered Date(s) Administered   ??? Hepatitis A 09/24/2016, 03/18/2017   ??? Influenza Vaccine Quad (IIV4 PF) 53mo+ injectable 08/02/2018   ??? Influenza Virus Vaccine, unspecified formulation 07/12/2017

## 2018-08-05 NOTE — Unmapped (Signed)
Patient's 10/8 and 10/22 bx results discussed in pathology conference yesterday by Dr.Greene in presence of Dr.Shah, Dr.Hayashi, Dr. Piedad Climes and Dr.Barritt. Positive for significant congesion, but no rejection or cholestasis noted-portal tracts were fine. Suboptimal blood flow suspected. Consensus was to do MRI/MRCP- no IV steroids have been started yet, d/t lack of evidence of rejection.

## 2018-08-05 NOTE — Unmapped (Signed)
Transplant Surgery Progress Note    Service Date: 08/05/2018  Admit Date: 08/02/2018, Hospital Day: 4  Hospital Service: Surg Transplant [facility])  Attending: Doyce Loose,*    Assessment     Sara Sanders is a 62 y.o. 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 Her post-operative course has been complicated by an RA flare, maintained on prednisone, and elevated ALT (up into the 300s). A recent biopsy on 07/20/18 was indeterminate for acute cellular rejection (RAI = 2/9). She was admitted from clinic on 08/02/18 due to persistently elevated liver enzymes in the setting of prednisone for expedited liver biopsy and ultrasound.     Interval Events   Overnight, blood cultures from 10/23 became positive for gram positive cocci in chains. She remained afebrile. She had a poor appetite yesterday and underwent an ERCP in the afternoon which showed a single, severe biliary stricture in the anastomosis. A stent was placed in the common bile duct. She denies any nausea or vomiting and only has mild pain in her epigastric area.    Plan     - prn tylenol, tramadol 50  - SORA, encourage IS and ambulation  - NPO w/ sips, protonix, bowel regimen (colace, miralax)  - D5 NS @125   - home synthroid daily  - liver biopsy shows no evidence of rejection  - liver ultrasound done 10/23 showed patent hepatic vasculature, interval decrease of hepatic artery resistive indices (low-low normal), and echogenic thickening of the CBD extending intrahepatically, likely related to cholangitis or cholangiopathy  - ERCP done 10/23 showed a single, severe biliary stricture in the anastomosis and a covered metal stent was placed in the common bile duct  - zosyn (10/23 - ) in response to fever to 39.2       - blood cultures showing gram positive cocci in chains vanc (10/23 - ); will continue vanc for now and wait for speciation and sensitivities   - will talk to ID regarding time course of antibiotics, oral regimen, and home antibiotic plan  - prograf, cellcept, valcyte, prednisone taper back to baseline, bactrim      Prophylaxis:  - DVT: heparin (subcutaneous). SCDs.     Disposition: Floor status.   -PT/OT recs: none  -Home health needs: pending      Objective     Vitals:   Temp:  [35.9 ??C-37.4 ??C] 36.9 ??C  Heart Rate:  [75-97] 82  SpO2 Pulse:  [83-89] 83  Resp:  [16-18] 16  BP: (92-130)/(51-78) 105/56  SpO2:  [90 %-99 %] 99 %    Physical Exam:  -General:  Appropriate, comfortable and in no apparent distress.   -Neurological: Moves all 4 extremities spontaneously.   -Cardiovascular: HDS  -Pulmonary: Normal work of breathing.   -Abdomen: Soft, mildly tender in epigastric area, non-distended. No rebound or guarding. Well healed Mercedes incision.  -Extremities: Warm, well perfused.     Recent Imaging:  Xr Chest Portable    Result Date: 08/04/2018  EXAM: XR CHEST PORTABLE DATE: 08/04/2018 8:06 AM ACCESSION: [id number] UN DICTATED: 08/04/2018 9:33 AM INTERPRETATION LOCATION: [facility]     CLINICAL INDICATION: 62 years old Female with FEVER      COMPARISON: 05/17/2018     TECHNIQUE: Portable Chest Radiograph.     FINDINGS:     Linear bandlike atelectasis in the right lower lung zone and associated  elevation of right hemidiaphragm.     No consolidation, pleural effusion or pneumothorax     Stable cardiomediastinal silhouette.                 Linear bandlike atelectasis in the right lower lung zone and associated elevation of right hemidiaphragm.    US Liver Transplant    Result Date: 08/04/2018  EXAM: US LIVER TRANSPLANT DATE: 08/04/2018 9:34 AM ACCESSION: [id number] UN DICTATED: 08/04/2018 9:40 AM INTERPRETATION LOCATION: [facility]     CLINICAL INDICATION: 62 years old Female with increased LFTs, possible rejection      TECHNIQUE: Limited ultrasound of the liver transplant and liver vasculature was obtained with grayscale, color Doppler and spectral Doppler imaging.     COMPARISON: 08/02/2018.     FINDINGS:     HEPATOBILIARY: The liver is normal in echotexture. No definite liver lesions. No intrahepatic biliary ductal dilatation. The common bile duct is normal in caliber. However, there is marked diffuse thickening and increased echogenicity of the wall of the common bile duct measuring up to 4 mm in maximal thickness, extending to the central intrahepatic bile ducts. The gallbladder is surgically absent.     VESSELS: - Portal vein: The main, left and right portal veins are patent with hepatopetal flow. Normal portal vein velocity ( 0.20 m/s or greater) - Splenic vein: Patent, with hepatopetal flow. - Hepatic veins/IVC: The IVC, left, middle and right hepatic veins are patent with triphasic waveforms. - Hepatic artery: Patent with interval decrease of resistive indices, now low-low normal. - Proximal aorta:  not well-visualized     OTHER: No ascites.         1.Patent hepatic transplant vasculature. 2.Interval decrease of hepatic artery resistive indices, low-low normal. 3.Echogenic thickening of the common bile duct wall extending intrahepatically, likely related to cholangitis or cholangiopathy.     Please see below for data measurements:     Liver: 20.1 cm, previously 15.8 cm     Main portal vein diameter: 0.66 cm, previously 0.6 cm     Main portal vein velocity pre anastomosis velocity: 0.21 m/s, previously 0.35 Main portal vein velocity anastomosis velocity: 0.35 m/s, previously 0.56 Main portal vein velocity post anastomosis velocity: 0.29 m/s, previously 0.49 Anterior right portal vein velocity: 0.3 m/s, previously 0.23 Posterior right portal vein velocity: 0.3 m/s, previously 0.19 Left portal vein velocity: 0.17 m/s, previously 0.23     Common hepatic artery resistive index: 0.45 and systolic acceleration time 8 msec, previously 0.93 and 33 respectively Right hepatic artery resistive index: 0.58 and systolic acceleration time 17 msec, previously 0.83 and 17 respectively Left hepatic artery resistive index: 0.51 and systolic acceleration time 17 msec, previously 0.87 and 25 respectively     Left hepatic vein flow: triphasic Middle hepatic vein flow: triphasic Right hepatic vein flow: triphasic Inferior vena cava flow: triphasic     Splenic vein midline flow: hepatopetal Splenic vein proximal flow: hepatopetal     Abdominal free fluid visualized: no    Fl  Gi Imaging (no Interpretation)    Result Date: 08/04/2018  This order was placed for internal RIS purposes.  This order does not require a diagnostic report.     jrc

## 2018-08-05 NOTE — Unmapped (Signed)
[facility] (ADVANCED ENDOSCOPY/BILIARY TEAM) TREATMENT PLAN UPDATE:     Patient is now s/p ERCP on 10/23 . Full report is available in the procedures tab. In order to view procedure images please see operative report under the Media tab.     In brief, ERCP revealed a single severe biliary stricture in the post-transplant anastomosis. The stricture was post-surgical. A biliary sphincterotomy was performed and a covered metal stent was placed into the common bile duct. We will repeat ERCP in 6 months to remove stent. This morning her bilirubin and LFTs appear to be downtrending appropriately. Her lipase is elevated and she has mild abdominal pain (3/10) so she may have some mild pancreatic inflammation but her pain is not consistent with post-ERCP pancreatitis.     Recommendations:   - Resume previous diet   - PRN analgesia for abdominal pain   - Repeat ERCP in 6 months to remove stent (This has been ordered. Our schedulers will reach out to patient to schedule her).     The advanced endoscopy/biliary team will now sign off. Please contact the advanced endoscopy team pager with further questions or concerns at [phone]     Oswaldo Conroy, FNP-C  Division of Gastroenterology  Advanced Therapeutic Endoscopy/Biliary Team

## 2018-08-05 NOTE — Unmapped (Signed)
VSS, afebrile, Patient alert and oriented.  Pt is NPO .  IV fluids and ABX running. Pt reports no pain.  Pt ambulates independently. Pt voiding adequate urine output. NAD noted.

## 2018-08-05 NOTE — Unmapped (Signed)
SRF CASE REVIEW    An interdisciplinary care conference was held today and included the following team members: Johna Sheriff, MD Transplant Surgeon, Heidi Dach, MD Transplant Surgeon, Aleen Campi, MD Transplant Surgeon, Everlene Balls Transplant Surgeon , SRF Surgery Resident , Drake Leach, [place] Transplant Physician Assistant, [place] Senate, RN, MSN Transplant Nurse Coordinator, Berna Bue, PharmD Transplant Pharmacist (covering), Sibyl Parr, LCSW Transplant Case Manager, Thomasene Mohair, LCSW Transplant Case Manager, Jackqulyn Livings, RD Transplant Dietician, West Carbo, MD Transplant Nephrologist, Lisbeth Ply, MD Transplant Nephrologist and [facility].    Per team, pt here day 3. Afebrile, vitals stable, Cr=1.07, LFTs trending down. Pt is on vanc due to positive culture. She has complained of abdominal pain. No rejection shown on biopsy.    Discharge Plan: no date at this time      Sibyl Parr, LCSW, CCTSW  Transplant Case Manager  [facility] for Transplant Care

## 2018-08-06 LAB — COMPREHENSIVE METABOLIC PANEL
ALBUMIN: 2.7 g/dL — ABNORMAL LOW (ref 3.5–5.0)
ALKALINE PHOSPHATASE: 75 U/L (ref 38–126)
ALT (SGPT): 166 U/L — ABNORMAL HIGH (ref 15–48)
ANION GAP: 6 mmol/L — ABNORMAL LOW (ref 7–15)
BILIRUBIN TOTAL: 1.2 mg/dL (ref 0.0–1.2)
BLOOD UREA NITROGEN: 10 mg/dL (ref 7–21)
CALCIUM: 7.9 mg/dL — ABNORMAL LOW (ref 8.5–10.2)
CHLORIDE: 105 mmol/L (ref 98–107)
CREATININE: 0.95 mg/dL (ref 0.60–1.00)
EGFR CKD-EPI AA FEMALE: 75 mL/min/{1.73_m2} (ref >=60)
EGFR CKD-EPI NON-AA FEMALE: 65 mL/min/{1.73_m2} (ref >=60)
GLUCOSE RANDOM: 183 mg/dL — ABNORMAL HIGH (ref 65–179)
POTASSIUM: 3.6 mmol/L (ref 3.5–5.0)
PROTEIN TOTAL: 4.9 g/dL — ABNORMAL LOW (ref 6.5–8.3)
SODIUM: 134 mmol/L — ABNORMAL LOW (ref 135–145)

## 2018-08-06 LAB — CBC
HEMATOCRIT: 34.4 % — ABNORMAL LOW (ref 36.0–46.0)
HEMOGLOBIN: 11.2 g/dL — ABNORMAL LOW (ref 12.0–16.0)
MEAN CORPUSCULAR VOLUME: 99.2 fL (ref 80.0–100.0)
MEAN PLATELET VOLUME: 8.1 fL (ref 7.0–10.0)
PLATELET COUNT: 73 10*9/L — ABNORMAL LOW (ref 150–440)
RED CELL DISTRIBUTION WIDTH: 14.2 % (ref 12.0–15.0)
WBC ADJUSTED: 3.4 10*9/L — ABNORMAL LOW (ref 4.5–11.0)

## 2018-08-06 LAB — PHOSPHORUS: Phosphate:MCnc:Pt:Ser/Plas:Qn:: 3.1

## 2018-08-06 LAB — MEAN PLATELET VOLUME: Lab: 8.1

## 2018-08-06 LAB — MAGNESIUM: Magnesium:MCnc:Pt:Ser/Plas:Qn:: 1.7

## 2018-08-06 LAB — CALCIUM: Calcium:MCnc:Pt:Ser/Plas:Qn:: 7.9 — ABNORMAL LOW

## 2018-08-06 LAB — TACROLIMUS, TROUGH: Lab: 9.4

## 2018-08-06 MED ORDER — PREDNISONE 10 MG TABLET
ORAL_TABLET | 0 refills | 0 days | Status: CP
Start: 2018-08-06 — End: 2018-08-23
  Filled 2018-08-09: qty 100, 50d supply, fill #0

## 2018-08-06 MED ORDER — PROGRAF 1 MG CAPSULE
ORAL_CAPSULE | Freq: Two times a day (BID) | ORAL | 11 refills | 0.00000 days | Status: CP
Start: 2018-08-06 — End: 2018-08-09

## 2018-08-06 MED ORDER — PROGRAF 1 MG CAPSULE: 7 mg | capsule | Freq: Two times a day (BID) | 11 refills | 0 days | Status: AC

## 2018-08-06 NOTE — Unmapped (Signed)
Transplant Surgery Progress Note    Service Date: 08/06/2018  Admit Date: 08/02/2018, Hospital Day: 5  Hospital Service: Surg Transplant [facility])  Attending: Doyce Loose,*    Assessment     Sara Sanders is a 62 y.o. 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 Her post-operative course has been complicated by an RA flare, maintained on prednisone, and elevated ALT (up into the 300s). A recent biopsy on 07/20/18 was indeterminate for acute cellular rejection (RAI = 2/9). She was admitted from clinic on 08/02/18 due to persistently elevated liver enzymes in the setting of prednisone for expedited liver biopsy and ultrasound.     Interval Events   No acute events overnight. Pain is much better today with minimal pain medication. Starting to feel hungry and looking forward to trying a clear liquid diet. Has been up ambulating.     Plan     - prn tylenol, tramadol 50  - SORA, encourage IS and ambulation  - CLD, protonix, bowel regimen (colace, miralax)  - D5 NS @ 75  - home synthroid daily  - liver biopsy negative for rejection; ERCP showed severe biliary stricture at post-transplant anastomosis, covered metal stent placed in common bile duct  - zosyn (10/23 - ) in response to fever to 39.2  - blood cultures positive for Enterococcus gallinarum/casseliflavus   - ID following; will continue zosyn for now and wait for susceptabilities for home antibiotic regimen recommendation  - will discuss with pharmacy regarding need for valcyte  - prograf, cellcept, prednisone taper back to baseline, bactrim      Prophylaxis:  - DVT: heparin (subcutaneous). SCDs.     Disposition: Floor status.   -PT/OT recs: none  -Home health needs: pending      Objective     Vitals:   Temp:  [36.2 ??C-37.1 ??C] 36.2 ??C  Heart Rate:  [67-80] 67  Resp:  [16] 16  BP: (111-135)/(54-62) 125/57  SpO2:  [95 %-98 %] 98 %    Physical Exam:  -General:  Appropriate, comfortable and in no apparent distress.   -Neurological: Moves all 4 extremities spontaneously.   -Cardiovascular: HDS  -Pulmonary: Normal work of breathing.   -Abdomen: Soft, non tender, non-distended. No rebound or guarding. Well healed Mercedes incision.  -Extremities: Warm, well perfused.     Recent Imaging:  US Liver Transplant    Result Date: 08/04/2018  EXAM: US LIVER TRANSPLANT DATE: 08/04/2018 9:34 AM ACCESSION: [id number] UN DICTATED: 08/04/2018 9:40 AM INTERPRETATION LOCATION: [facility]     CLINICAL INDICATION: 62 years old Female with increased LFTs, possible rejection      TECHNIQUE: Limited ultrasound of the liver transplant and liver vasculature was obtained with grayscale, color Doppler and spectral Doppler imaging.     COMPARISON: 08/02/2018.     FINDINGS:     HEPATOBILIARY: The liver is normal in echotexture. No definite liver lesions. No intrahepatic biliary ductal dilatation. The common bile duct is normal in caliber. However, there is marked diffuse thickening and increased echogenicity of the wall of the common bile duct measuring up to 4 mm in maximal thickness, extending to the central intrahepatic bile ducts. The gallbladder is surgically absent.     VESSELS: - Portal vein: The main, left and right portal veins are patent with hepatopetal flow. Normal portal vein velocity ( 0.20 m/s or greater) -  Splenic vein: Patent, with hepatopetal flow. - Hepatic veins/IVC: The IVC, left, middle and right hepatic veins are patent with triphasic waveforms. - Hepatic artery: Patent with interval decrease of resistive indices, now low-low normal. - Proximal aorta:  not well-visualized     OTHER: No ascites.         1.Patent hepatic transplant vasculature. 2.Interval decrease of hepatic artery resistive indices, low-low normal. 3.Echogenic thickening of the common bile duct wall extending intrahepatically, likely related to cholangitis or cholangiopathy.     Please see below for data measurements:     Liver: 20.1 cm, previously 15.8 cm     Main portal vein diameter: 0.66 cm, previously 0.6 cm     Main portal vein velocity pre anastomosis velocity: 0.21 m/s, previously 0.35 Main portal vein velocity anastomosis velocity: 0.35 m/s, previously 0.56 Main portal vein velocity post anastomosis velocity: 0.29 m/s, previously 0.49 Anterior right portal vein velocity: 0.3 m/s, previously 0.23 Posterior right portal vein velocity: 0.3 m/s, previously 0.19 Left portal vein velocity: 0.17 m/s, previously 0.23     Common hepatic artery resistive index: 0.45 and systolic acceleration time 8 msec, previously 0.93 and 33 respectively Right hepatic artery resistive index: 0.58 and systolic acceleration time 17 msec, previously 0.83 and 17 respectively Left hepatic artery resistive index: 0.51 and systolic acceleration time 17 msec, previously 0.87 and 25 respectively     Left hepatic vein flow: triphasic Middle hepatic vein flow: triphasic Right hepatic vein flow: triphasic Inferior vena cava flow: triphasic     Splenic vein midline flow: hepatopetal Splenic vein proximal flow: hepatopetal     Abdominal free fluid visualized: no    Fl [facility] [facility] (no Interpretation)    Result Date: 08/04/2018  This order was placed for internal RIS purposes.  This order does not require a diagnostic report.     jrc

## 2018-08-06 NOTE — Unmapped (Signed)
SRF TEACHING ROUNDS    An interdisciplinary care conference was held today and included the following team members: An interdisciplinary care conference was held today and included the following team members: Bufford Lope, MD Transplant Surgeon , SRF Surgery Resident , Drake Leach, [place] Transplant Physician Assistant, Cranston Neighbor, RN Transplant Nurse Coordinator, Carole Binning, LMSW Transplant Case Manager, Geannie Risen, LCSW Transplant Case Manager and Newton Pigg RN .     Per team, Patient stable; awaiting ID recs to determine d/c course. Patient may need IV abx.     Discharge Plan: Home with Self Care versus Home with Infusion.       Carole Binning, LCSWA   Transplant Social Worker/Case Manager  [facility] for Transplant Care

## 2018-08-06 NOTE — Unmapped (Signed)
IMMUNOCOMPROMISED HOST INFECTIOUS DISEASE PROGRESS NOTE    Assessment/Plan:     Sara Sanders is a 62 y.o. 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 Initially admitted for concern for rejection in setting of elevated ALT, biopsy on 07/20/18 was indeterminate for acute cellular rejection (RAI = 2/9), subsequent biopsy in setting of persistently elevated ALT not consistent with rejection, demonstrated significant congestion, ?suboptimal blood flow suspected. While inpatient developed acute cholangitis and blood cultures 2/2 with enterococcus gallinarum (10/23). 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    ??  ID Problem List:  Autoimmune hepatitis s/p liver transplant 04/28/18, [facility] high risk donor  - Surgical complications: RA flare, maintained on pred  - Serologies: CMV D-/R+, EBV D-/R+  - Induction: steroids  - Immunosuppression: prednisone, tac mycohpenolate  - Prophylaxis: bactrim MWF, valgan  - Rejection history (delete if no h/o rejection)  ??  Pertinent Exposure History  High risk donor, repeat HIV, Hep serologies negative one month post transplant (05/28/18)  ??  Pertinent Co-morbidities  #RA  #HTN  ??  Drug Intolerances  NKDA  ??  Infection History    #acute cholangitis w enterococcal gallinarum bacteremia - 08/04/18  - 10/23 s/p ERCP w severe biliary stricture in anastomosis w deployment of covered metal stent in CBD  - 10/23 2/2 bld cx w enterococcus gallinarum, susc pending  ??  Recommendations:  - continue zosyn 3.375g q6hr  - f/u susceptibilities from 10/23 blood cx  - please look into feasibility of home ampicillin, will need 14-d course, no need for subsequent ppx even in setting of stent unless pt has recurrent episodes while stent is in place. linezolid likely not an option in setting of thrombocytopenia  - please do weekly CMV serum PCR while off valgancyclovir (presumably held for thrombocytopenia)  - f/u HSV swab of lesion on L upper lip    The [facility] ID service will continue to follow.  Please page the ID Transplant/Liquid Oncology Fellow consult at [phone] with questions.  Patient discussed with Dr. Reynold Bowen.    Terrall Laity, MD, PhD  Fellow, [facility] Infectious Diseases  Pager:  [phone]      Subjective:     Interval History:    History obtained from:patient and family member.    No complaints. Some nausea after med this morning, resovlied. No belly pain. No cough. No diarrhea.     Medications:  Antimicrobials:  zosyn    Prior/Current immunomodulators:   prednisone, tac mycohpenolate    Other medications reviewed.    Objective:     Vital Signs last 24 hours:  Temp:  [36.2 ??C-37.1 ??C] 36.2 ??C  Heart Rate:  [67-80] 67  Resp:  [16] 16  BP: (111-135)/(54-62) 125/57  SpO2:  [95 %-98 %] 98 %    Physical Exam:  Patient Lines/Drains/Airways Status    Active Active Lines, Drains, & Airways     Name:   Placement date:   Placement time:   Site:   Days:    Peripheral IV 08/04/18 Left Antecubital   08/04/18    0610    Antecubital   2              Gen: pleasant woman in NAD, comfortable  HEENT: PERRL, jagged ulceration on L upper lip w exudate, improving erythema  LAD: no supraclavicular, cervical,  LAD  Pulm: nl work of breathing  CV: RRR without M/R/G  Abd: soft/NT/ND +BS  Ext: no LEE, LEs w/wp  Neuro: AAOx3, full sentences, no focal deficits.  Psych: normal mood/affect  Access: peripheral appears uninfected    Labs:  Lab Results   Component Value Date    WBC 3.4 (L) 08/06/2018    WBC 5.7 08/05/2018    WBC 8.3 08/04/2018    WBC 2.1 (LL) 03/17/2018    WBC 2.2 (LL) 03/03/2018    WBC 2.8 (L) 02/23/2018    HGB 11.2 (L) 08/06/2018    HGB 9.8 (L) 03/17/2018    HCT 34.4 (L) 08/06/2018    HCT 27.3 (L) 03/17/2018    Platelet 73 (L) 08/06/2018    Platelet 144 (L) 03/17/2018    Absolute Neutrophils 3.7 08/02/2018 Absolute Neutrophils 1.1 (L) 03/17/2018    Absolute Lymphocytes 0.8 (L) 08/02/2018    Absolute Lymphocytes 0.8 03/17/2018    Absolute Eosinophils 0.1 08/02/2018    Absolute Eosinophils 0.0 03/17/2018    Sodium 134 (L) 08/06/2018    Sodium 133 (L) 03/17/2018    Potassium 3.6 08/06/2018    Potassium 3.6 03/17/2018    BUN 10 08/06/2018    BUN 6 (L) 03/17/2018    Creatinine 0.95 08/06/2018    Creatinine 1.07 (H) 08/05/2018    Creatinine 0.96 08/04/2018    Creatinine 0.70 03/17/2018    Creatinine 0.60 03/03/2018    Creatinine 0.75 02/23/2018    Glucose 183 (H) 08/06/2018    Glucose 86 03/17/2018    Magnesium 1.7 08/06/2018    Albumin 2.7 (L) 08/06/2018    Total Bilirubin 1.2 08/06/2018    Total Bilirubin 3.6 (H) 03/17/2018    AST 23 08/06/2018    AST 65 (H) 03/17/2018    ALT 166 (H) 08/06/2018    ALT 31 03/17/2018    Alkaline Phosphatase 75 08/06/2018    Alkaline Phosphatase 87 03/17/2018    INR 0.97 08/03/2018    INR 1.6 (H) 03/17/2018       Microbiology:  Past cultures were reviewed in Epic and CareEverywhere.    Imaging:   No new imaging

## 2018-08-06 NOTE — Unmapped (Signed)
Plan of care reviewed at beginning of shift and as needed. Remains NPO, sips with meds, without complaint of nausea or emesis. Denies pain requiring intervention with PRN pain medications. VSS; afebrile. Ambulates independently; remains free of falls and injuries. Adequate fluid intake via MIVF and appropriate urine output to this point in shift. All questions and concerns answered as they arise. Currently ambulating in room, NAD, call bell within reach. Will continue to monitor.     Problem: Adult Inpatient Plan of Care  Goal: Plan of Care Review  Outcome: Ongoing - Unchanged  Goal: Patient-Specific Goal (Individualization)  Outcome: Ongoing - Unchanged  Flowsheets (Taken 08/06/2018 0532)  Patient-Specific Goals (Include Timeframe): Patient would like to get some rest this shift  Individualized Care Needs: Prefers to be called Sophiah  Goal: Absence of Hospital-Acquired Illness or Injury  Outcome: Ongoing - Unchanged  Goal: Optimal Comfort and Wellbeing  Outcome: Ongoing - Unchanged  Goal: Readiness for Transition of Care  Outcome: Ongoing - Unchanged  Goal: Rounds/Family Conference  Outcome: Ongoing - Unchanged     Problem: Pain Acute  Goal: Optimal Pain Control  Outcome: Ongoing - Unchanged     Problem: Infection  Goal: Infection Symptom Resolution  Outcome: Ongoing - Unchanged     Problem: Hypertension Comorbidity  Goal: Blood Pressure in Desired Range  Outcome: Ongoing - Unchanged

## 2018-08-06 NOTE — Unmapped (Signed)
POC reviewed with patient at beginning of shift and prn. Patient advanced to clear liquids today, tolerating well. No c/o pain. Ambulating independently. VSS. Had a bowel movement this am. Will continue to monitor.     Problem: Adult Inpatient Plan of Care  Goal: Plan of Care Review  Outcome: Progressing  Goal: Patient-Specific Goal (Individualization)  Outcome: Progressing  Goal: Absence of Hospital-Acquired Illness or Injury  Outcome: Progressing  Goal: Optimal Comfort and Wellbeing  Outcome: Progressing  Goal: Readiness for Transition of Care  Outcome: Progressing  Goal: Rounds/Family Conference  Outcome: Progressing

## 2018-08-07 LAB — CBC
HEMATOCRIT: 34.6 % — ABNORMAL LOW (ref 36.0–46.0)
MEAN CORPUSCULAR HEMOGLOBIN CONC: 32.8 g/dL (ref 31.0–37.0)
MEAN CORPUSCULAR HEMOGLOBIN: 32.4 pg (ref 26.0–34.0)
MEAN CORPUSCULAR VOLUME: 98.9 fL (ref 80.0–100.0)
MEAN PLATELET VOLUME: 7.9 fL (ref 7.0–10.0)
PLATELET COUNT: 91 10*9/L — ABNORMAL LOW (ref 150–440)
RED CELL DISTRIBUTION WIDTH: 14.2 % (ref 12.0–15.0)
WBC ADJUSTED: 4.1 10*9/L — ABNORMAL LOW (ref 4.5–11.0)

## 2018-08-07 LAB — COMPREHENSIVE METABOLIC PANEL
ALBUMIN: 2.7 g/dL — ABNORMAL LOW (ref 3.5–5.0)
ALKALINE PHOSPHATASE: 64 U/L (ref 38–126)
ALT (SGPT): 119 U/L — ABNORMAL HIGH (ref 15–48)
ANION GAP: 8 mmol/L (ref 7–15)
AST (SGOT): 17 U/L (ref 14–38)
BILIRUBIN TOTAL: 1 mg/dL (ref 0.0–1.2)
BLOOD UREA NITROGEN: 9 mg/dL (ref 7–21)
BUN / CREAT RATIO: 9
CHLORIDE: 107 mmol/L (ref 98–107)
CO2: 21 mmol/L — ABNORMAL LOW (ref 22.0–30.0)
CREATININE: 0.96 mg/dL (ref 0.60–1.00)
EGFR CKD-EPI AA FEMALE: 74 mL/min/{1.73_m2} (ref >=60)
EGFR CKD-EPI NON-AA FEMALE: 64 mL/min/{1.73_m2} (ref >=60)
GLUCOSE RANDOM: 137 mg/dL (ref 65–179)
POTASSIUM: 3.3 mmol/L — ABNORMAL LOW (ref 3.5–5.0)
PROTEIN TOTAL: 5.1 g/dL — ABNORMAL LOW (ref 6.5–8.3)
SODIUM: 136 mmol/L (ref 135–145)

## 2018-08-07 LAB — CALCIUM: Calcium:MCnc:Pt:Ser/Plas:Qn:: 8.2 — ABNORMAL LOW

## 2018-08-07 LAB — PHOSPHORUS: Phosphate:MCnc:Pt:Ser/Plas:Qn:: 3.5

## 2018-08-07 LAB — MAGNESIUM
MAGNESIUM: 1.4 mg/dL — ABNORMAL LOW (ref 1.6–2.2)
Magnesium:MCnc:Pt:Ser/Plas:Qn:: 1.4 — ABNORMAL LOW

## 2018-08-07 LAB — PLATELET COUNT: Lab: 91 — ABNORMAL LOW

## 2018-08-07 LAB — TACROLIMUS, TROUGH: Lab: 15.8 — ABNORMAL HIGH

## 2018-08-07 NOTE — Unmapped (Signed)
IMMUNOCOMPROMISED HOST INFECTIOUS DISEASE PROGRESS NOTE    Assessment/Plan:     Sara Sanders is a 62 y.o. 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 Initially admitted for concern for rejection in setting of elevated ALT, biopsy on 07/20/18 was indeterminate for acute cellular rejection (RAI = 2/9), subsequent biopsy in setting of persistently elevated ALT not consistent with rejection, demonstrated significant congestion, ?suboptimal blood flow suspected. While inpatient developed acute cholangitis and blood cultures 2/2 with enterococcus gallinarum (10/23). 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    ??  ID Problem List:  Autoimmune hepatitis s/p liver transplant 04/28/18, [facility] high risk donor  - Surgical complications: RA flare, maintained on pred  - Serologies: CMV D-/R+, EBV D-/R+  - Induction: steroids  - Immunosuppression: prednisone, tac mycohpenolate  - Prophylaxis: bactrim MWF, valgan  - Rejection history (delete if no h/o rejection)  ??  Pertinent Exposure History  High risk donor, repeat HIV, Hep serologies negative one month post transplant (05/28/18)  ??  Pertinent Co-morbidities  #RA  #HTN  ??  Drug Intolerances  NKDA  ??  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  Recommendations:  - continue zosyn 3.375g q6hr through 10/27  - please look into feasibility of home ampicillin vs dapto, will need 14-d course. Preferably would not go home on zosyn to avoid nephrotoxicity, no need for subsequent ppx even in setting of stent unless pt has recurrent episodes while stent is in place.   - please obtain CMV serum PCR now and then weekly while off valgancyclovir (presumably held for thrombocytopenia)    The [facility] ID service will continue to follow.  Please page the ID Transplant/Liquid Oncology Fellow consult at [phone] with questions.  Patient discussed with Dr. Reynold Bowen.    Terrall Laity, MD, PhD  Fellow, [facility] Infectious Diseases  Pager:  [phone]      Subjective:     Interval History:    History obtained from:patient and family member.    No complaints. AF. No abd pain. Some diarrhea since starting abx.     Medications:  Antimicrobials:  zosyn    Prior/Current immunomodulators:   prednisone, tac mycohpenolate    Other medications reviewed.    Objective:     Vital Signs last 24 hours:  Temp:  [35.7 ??C-36.7 ??C] 35.7 ??C  Heart Rate:  [65-78] 65  Resp:  [18] 18  BP: (110-124)/(55-66) 124/66  MAP (mmHg):  [81-87] 87  SpO2:  [96 %-98 %] 98 %    Physical Exam:  Patient Lines/Drains/Airways Status    Active Active Lines, Drains, & Airways     None              Gen: pleasant woman in NAD, comfortable, on RA  HEENT: PERRL, OP nl  LAD: no supraclavicular, cervical,  LAD  Pulm: nl work of breathing  CV: RRR without M/R/G  Abd: soft/NT/ND +BS  Ext: no LEE, LEs w/wp  Neuro: AAOx3, full sentences, no focal deficits.  Psych: normal mood/affect  Access: peripheral appears  uninfected    Labs:  Lab Results   Component Value Date    WBC 4.1 (L) 08/07/2018    WBC 3.4 (L) 08/06/2018    WBC 5.7 08/05/2018    WBC 2.1 (LL) 03/17/2018    WBC 2.2 (LL) 03/03/2018    WBC 2.8 (L) 02/23/2018    HGB 11.3 (L) 08/07/2018    HGB 9.8 (L) 03/17/2018    HCT 34.6 (L) 08/07/2018    HCT 27.3 (L) 03/17/2018    Platelet 91 (L) 08/07/2018    Platelet 144 (L) 03/17/2018    Absolute Neutrophils 3.7 08/02/2018    Absolute Neutrophils 1.1 (L) 03/17/2018    Absolute Lymphocytes 0.8 (L) 08/02/2018    Absolute Lymphocytes 0.8 03/17/2018    Absolute Eosinophils 0.1 08/02/2018 Absolute Eosinophils 0.0 03/17/2018    Sodium 136 08/07/2018    Sodium 133 (L) 03/17/2018    Potassium 3.3 (L) 08/07/2018    Potassium 3.6 03/17/2018    BUN 9 08/07/2018    BUN 6 (L) 03/17/2018    Creatinine 0.96 08/07/2018    Creatinine 0.95 08/06/2018    Creatinine 1.07 (H) 08/05/2018    Creatinine 0.70 03/17/2018    Creatinine 0.60 03/03/2018    Creatinine 0.75 02/23/2018    Glucose 137 08/07/2018    Glucose 86 03/17/2018    Magnesium 1.4 (L) 08/07/2018    Albumin 2.7 (L) 08/07/2018    Total Bilirubin 1.0 08/07/2018    Total Bilirubin 3.6 (H) 03/17/2018    AST 17 08/07/2018    AST 65 (H) 03/17/2018    ALT 119 (H) 08/07/2018    ALT 31 03/17/2018    Alkaline Phosphatase 64 08/07/2018    Alkaline Phosphatase 87 03/17/2018    INR 0.97 08/03/2018    INR 1.6 (H) 03/17/2018       Microbiology:  Past cultures were reviewed in Epic and CareEverywhere.    Imaging:   No new imaging

## 2018-08-07 NOTE — Unmapped (Addendum)
Tacrolimus Therapeutic Monitoring Pharmacy Note    Sara Sanders is a 62 y.o. female continuing tacrolimus.     Indication: Liver transplant     Date of Transplant: 04/28/2018      Prior Dosing Information: Current regimen 7 mg BID      Goals:  Therapeutic Drug Levels  Tacrolimus trough goal: 8-10 ng/mL    Additional Clinical Monitoring/Outcomes  ?? Monitor renal function (SCr and urine output) and liver function (LFTs)  ?? Monitor for signs/symptoms of adverse events (e.g., hyperglycemia, hyperkalemia, hypomagnesemia, hypertension, headache, tremor)    Results:   Tacrolimus level: 15.8 ng/mL, drawn drawn ~1 hour early     Pharmacokinetic Considerations and Significant Drug Interactions:  ? Concurrent hepatotoxic medications: None identified  ? Concurrent CYP3A4 substrates/inhibitors: None identified  ? Concurrent nephrotoxic medications: None identified    Assessment/Plan:  Recommendedation(s) :  HOLD X1 dose, then decrease as below  ? Decrease to 6 mg BID    Follow-up  ? Daily Tac levels prior to AM dose.   ? A pharmacist will continue to monitor and recommend levels as appropriate    Please page service pharmacist with questions/clarifications.    Ladon Applebaum, PharmD

## 2018-08-07 NOTE — Unmapped (Signed)
Transplant Surgery Progress Note    Service Date: 08/07/2018  Admit Date: 08/02/2018, Hospital Day: 6  Hospital Service: Surg Transplant [facility])  Attending: Doyce Loose,*    Assessment     Emerita LANAE FEDERER is a 62 y.o. 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 Her post-operative course has been complicated by an RA flare, maintained on prednisone, and elevated ALT (up into the 300s). A recent biopsy on 07/20/18 was indeterminate for acute cellular rejection (RAI = 2/9). She was admitted from clinic on 08/02/18 due to persistently elevated liver enzymes in the setting of prednisone for expedited liver biopsy and ultrasound. Subsequently developed cholangitis s/p liver biopsy and was found to have biliary anastomotic stricture on ERCP, now s/p stent placement.     Interval Events   No acute events overnight. Tolerated small amount of solid food last night with minimal abdominal pain. ID consult yesterday for antibiotic recommendations- recommend continuing zosyn for now and await sensitivities.     Plan     - prn tylenol, tramadol 50  - SORA, encourage IS and ambulation  - Regular diet, ML, protonix, bowel regimen (colace, miralax)  - Replete mag, K  - home synthroid daily  - liver biopsy negative for rejection; ERCP showed severe biliary stricture at post-transplant anastomosis, covered metal stent placed in common bile duct  - zosyn (10/23 - ) in response to fever to 39.2  - blood cultures positive for Enterococcus gallinarum/casseliflavus- sensitive to both ampicillin and daptomycin  - ID following; will follow up final recommendations and begin working on setting up home IV infusion  - Repeat blood cultures obtained 10/25 so that PICC can be placed  - prograf, cellcept, prednisone taper back to baseline, bactrim Prophylaxis:  - DVT: heparin (subcutaneous). SCDs.     Disposition: Floor status.   -PT/OT recs: none  -Home health needs: IV infusion for antibiotics      Objective     Vitals:   Temp:  [35.7 ??C-36.7 ??C] 36.2 ??C  Heart Rate:  [65-77] 71  Resp:  [16-18] 16  BP: (119-129)/(57-66) 129/62  MAP (mmHg):  [81-87] 87  SpO2:  [96 %-100 %] 100 %    Physical Exam:  -General:  Appropriate, comfortable and in no apparent distress.   -Neurological: Moves all 4 extremities spontaneously.   -Cardiovascular: HDS  -Pulmonary: Normal work of breathing.   -Abdomen: Soft, non tender, non-distended. No rebound or guarding. Well healed Mercedes incision.  -Extremities: Warm, well perfused.       Jenna Luo, MD  General Surgery, PGY-3  [phone]

## 2018-08-07 NOTE — Unmapped (Signed)
PICC LINE TRIAGE NOTE    The Venous Access Team has received an order for PICC placement.  This patient has been triaged and  VAT will attempt bedside placement as schedule permits and blood cultures are negative x 48 hrs. Previous blood cultures speciated with Enterococcus, current BCx for TOC.      Thank You,    Jacqulyn Liner RN Venous Access Team [phone]      Workup Time:  30 minutes

## 2018-08-07 NOTE — Unmapped (Signed)
Plan of care reviewed w/ pt this AM. VSS. Afebrile. Adequate UOP. IV abx given as ordered. Up ad lib. Denies pain. Denies nausea. Pt had a BM this AM. No falls or injuries this shift.

## 2018-08-07 NOTE — Unmapped (Signed)
Overnight had good night. Continues on IV antx. Tolerating well. Denies pain overnight. Call light within reach. Will continue to monitor patient. Fall precautions in place.   Problem: Adult Inpatient Plan of Care  Goal: Plan of Care Review  Outcome: Progressing  Goal: Patient-Specific Goal (Individualization)  Outcome: Progressing  Goal: Absence of Hospital-Acquired Illness or Injury  Outcome: Progressing  Goal: Optimal Comfort and Wellbeing  Outcome: Progressing  Goal: Readiness for Transition of Care  Outcome: Progressing  Goal: Rounds/Family Conference  Outcome: Progressing     Problem: Pain Acute  Goal: Optimal Pain Control  Outcome: Progressing     Problem: Infection  Goal: Infection Symptom Resolution  Outcome: Progressing     Problem: Hypertension Comorbidity  Goal: Blood Pressure in Desired Range  Outcome: Progressing

## 2018-08-08 LAB — CBC
HEMATOCRIT: 35 % — ABNORMAL LOW (ref 36.0–46.0)
HEMOGLOBIN: 11.3 g/dL — ABNORMAL LOW (ref 12.0–16.0)
MEAN CORPUSCULAR HEMOGLOBIN CONC: 32.3 g/dL (ref 31.0–37.0)
MEAN CORPUSCULAR VOLUME: 97.7 fL (ref 80.0–100.0)
MEAN PLATELET VOLUME: 8 fL (ref 7.0–10.0)
RED BLOOD CELL COUNT: 3.58 10*12/L — ABNORMAL LOW (ref 4.00–5.20)
RED CELL DISTRIBUTION WIDTH: 13.9 % (ref 12.0–15.0)
WBC ADJUSTED: 4.2 10*9/L — ABNORMAL LOW (ref 4.5–11.0)

## 2018-08-08 LAB — PHOSPHORUS: Phosphate:MCnc:Pt:Ser/Plas:Qn:: 3.4

## 2018-08-08 LAB — COMPREHENSIVE METABOLIC PANEL
ALBUMIN: 3 g/dL — ABNORMAL LOW (ref 3.5–5.0)
ALKALINE PHOSPHATASE: 71 U/L (ref 38–126)
ALT (SGPT): 99 U/L — ABNORMAL HIGH (ref 15–48)
ANION GAP: 5 mmol/L — ABNORMAL LOW (ref 7–15)
AST (SGOT): 16 U/L (ref 14–38)
BLOOD UREA NITROGEN: 13 mg/dL (ref 7–21)
BUN / CREAT RATIO: 14
CALCIUM: 8.5 mg/dL (ref 8.5–10.2)
CO2: 24 mmol/L (ref 22.0–30.0)
CREATININE: 0.95 mg/dL (ref 0.60–1.00)
EGFR CKD-EPI AA FEMALE: 75 mL/min/{1.73_m2} (ref >=60)
EGFR CKD-EPI NON-AA FEMALE: 65 mL/min/{1.73_m2} (ref >=60)
GLUCOSE RANDOM: 134 mg/dL (ref 65–179)
POTASSIUM: 4.1 mmol/L (ref 3.5–5.0)
PROTEIN TOTAL: 5.4 g/dL — ABNORMAL LOW (ref 6.5–8.3)
SODIUM: 135 mmol/L (ref 135–145)

## 2018-08-08 LAB — MAGNESIUM: Magnesium:MCnc:Pt:Ser/Plas:Qn:: 1.7

## 2018-08-08 LAB — CHLORIDE: Chloride:SCnc:Pt:Ser/Plas:Qn:: 106

## 2018-08-08 LAB — MEAN CORPUSCULAR VOLUME: Lab: 97.7

## 2018-08-08 LAB — TACROLIMUS, TROUGH: Lab: 9.3

## 2018-08-08 NOTE — Unmapped (Signed)
Transplant Surgery Progress Note    Service Date: 08/08/2018  Admit Date: 08/02/2018, Hospital Day: 7  Hospital Service: Surg Transplant [facility])  Attending: Doyce Loose,*    Assessment     Sara Sanders is a 62 y.o. 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 Her post-operative course has been complicated by an RA flare, maintained on prednisone, and elevated ALT (up into the 300s). A recent biopsy on 07/20/18 was indeterminate for acute cellular rejection (RAI = 2/9). She was admitted from clinic on 08/02/18 due to persistently elevated liver enzymes in the setting of prednisone for expedited liver biopsy and ultrasound. Subsequently developed cholangitis s/p liver biopsy and was found to have biliary anastomotic stricture on ERCP, now s/p stent placement.     Interval Events   No acute events overnight. Continues to tolerate a regular diet with minimal abdominal pain. Remains afebrile with downtrending LFTs.     Plan     - PRN tylenol, tramadol 50  - SORA, encourage IS and ambulation  - Regular diet, ML, protonix, bowel regimen (colace, miralax)  - Home synthroid daily  - Liver biopsy negative for rejection; ERCP showed severe biliary stricture at post-transplant anastomosis, covered metal stent placed in common bile duct  - Blood cultures positive for Enterococcus gallinarum/casseliflavus- sensitive to both ampicillin and daptomycin  - ID following; continue IV zosyn (10/23- ), recommend D/C with 14 day total course on either IV ampicillin or daptomycin (depending on insurance and home health availability)  - Repeat blood cultures from 10/25 with no growth at 24 hours- will place PICC if no growth at 48 hours  - Prograf, cellcept, prednisone taper back to baseline, bactrim      Prophylaxis:  - DVT: heparin (subcutaneous). SCDs.     Disposition: Floor status.   -PT/OT recs: none  -Home health needs: IV infusion for antibiotics      Objective     Vitals:   Temp:  [35.8 ??C-36.6 ??C] 36.3 ??C  Heart Rate:  [61-73] 64  Resp:  [16-18] 18  BP: (128-140)/(62-70) 140/70  MAP (mmHg):  [89-99] 99  SpO2:  [96 %-100 %] 96 %    Physical Exam:  -General:  Appropriate, comfortable and in no apparent distress.   -Neurological: Moves all 4 extremities spontaneously.   -Cardiovascular: HDS  -Pulmonary: Normal work of breathing.   -Abdomen: Soft, non tender, non-distended. No rebound or guarding. Well healed Mercedes incision.  -Extremities: Warm, well perfused.       Jenna Luo, MD  General Surgery, PGY-3  [phone]

## 2018-08-09 LAB — EGFR CKD-EPI NON-AA FEMALE: Lab: 62

## 2018-08-09 LAB — CBC
HEMATOCRIT: 34 % — ABNORMAL LOW (ref 36.0–46.0)
HEMOGLOBIN: 11.1 g/dL — ABNORMAL LOW (ref 12.0–16.0)
MEAN CORPUSCULAR HEMOGLOBIN CONC: 32.6 g/dL (ref 31.0–37.0)
MEAN CORPUSCULAR HEMOGLOBIN: 32 pg (ref 26.0–34.0)
MEAN CORPUSCULAR VOLUME: 98.1 fL (ref 80.0–100.0)
MEAN PLATELET VOLUME: 8.4 fL (ref 7.0–10.0)
PLATELET COUNT: 121 10*9/L — ABNORMAL LOW (ref 150–440)
RED BLOOD CELL COUNT: 3.47 10*12/L — ABNORMAL LOW (ref 4.00–5.20)
RED CELL DISTRIBUTION WIDTH: 14 % (ref 12.0–15.0)
WBC ADJUSTED: 4.8 10*9/L (ref 4.5–11.0)

## 2018-08-09 LAB — MAGNESIUM: Magnesium:MCnc:Pt:Ser/Plas:Qn:: 1.4 — ABNORMAL LOW

## 2018-08-09 LAB — COMPREHENSIVE METABOLIC PANEL
ALBUMIN: 2.9 g/dL — ABNORMAL LOW (ref 3.5–5.0)
ALKALINE PHOSPHATASE: 57 U/L (ref 38–126)
ANION GAP: 10 mmol/L (ref 7–15)
AST (SGOT): 18 U/L (ref 14–38)
BILIRUBIN TOTAL: 0.7 mg/dL (ref 0.0–1.2)
BLOOD UREA NITROGEN: 14 mg/dL (ref 7–21)
BUN / CREAT RATIO: 14
CALCIUM: 8.3 mg/dL — ABNORMAL LOW (ref 8.5–10.2)
CHLORIDE: 104 mmol/L (ref 98–107)
CO2: 24 mmol/L (ref 22.0–30.0)
CREATININE: 0.99 mg/dL (ref 0.60–1.00)
EGFR CKD-EPI AA FEMALE: 72 mL/min/{1.73_m2} (ref >=60)
EGFR CKD-EPI NON-AA FEMALE: 62 mL/min/{1.73_m2} (ref >=60)
GLUCOSE RANDOM: 96 mg/dL (ref 65–179)
POTASSIUM: 3.8 mmol/L (ref 3.5–5.0)
PROTEIN TOTAL: 5.3 g/dL — ABNORMAL LOW (ref 6.5–8.3)
SODIUM: 138 mmol/L (ref 135–145)

## 2018-08-09 LAB — CMV DNA, QUANTITATIVE, PCR: CMV VIRAL LD: NOT DETECTED

## 2018-08-09 LAB — WBC ADJUSTED: Lab: 4.8

## 2018-08-09 LAB — CMV COMMENT: Lab: 0

## 2018-08-09 LAB — PHOSPHORUS: Phosphate:MCnc:Pt:Ser/Plas:Qn:: 4

## 2018-08-09 LAB — TACROLIMUS, TROUGH: Lab: 7.7

## 2018-08-09 MED ORDER — AMPICILLIN 1 GRAM INTRAVENOUS SOLUTION
INTRAVENOUS | 0 refills | 0.00000 days | Status: CP
Start: 2018-08-09 — End: 2018-08-23

## 2018-08-09 MED ORDER — PROGRAF 1 MG CAPSULE
ORAL_CAPSULE | Freq: Two times a day (BID) | ORAL | 11 refills | 0.00000 days | Status: CP
Start: 2018-08-09 — End: 2018-10-12

## 2018-08-09 MED FILL — PREDNISONE 10 MG TABLET: 50 days supply | Qty: 100 | Fill #0 | Status: AC

## 2018-08-09 NOTE — Unmapped (Signed)
Transplant Surgery Progress Note    Service Date: 08/09/2018  Admit Date: 08/02/2018, Hospital Day: 8  Hospital Service: Surg Transplant [facility])  Attending: Doyce Loose,*    Assessment     Sara Sanders is a 62 y.o. 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 Her post-operative course has been complicated by an RA flare, maintained on prednisone, and elevated ALT (up into the 300s). A recent biopsy on 07/20/18 was indeterminate for acute cellular rejection (RAI = 2/9). She was admitted from clinic on 08/02/18 due to persistently elevated liver enzymes in the setting of prednisone for expedited liver biopsy and ultrasound. Subsequently developed cholangitis s/p liver biopsy and was found to have biliary anastomotic stricture on ERCP, now s/p stent placement.     Interval Events   No acute events overnight. Tolerating a regular diet without any nausea/vomiting. Ambulating around unit. Denies any abdominal pain.     Plan     - prn tylenol, tramadol 50  - SORA, encourage IS and ambulation  - regular diet, medlocked, protonix, bowel regimen (colace, miralax)  - replete magnesium - level is 1.4 today   - home synthroid daily  - liver biopsy negative for rejection; ERCP showed severe biliary stricture at post-transplant anastomosis, covered metal stent placed in common bile duct  - blood cultures positive for Enterococcus gallinarum/casseliflavus - sensitive to both ampicillin and daptomycin  - ID following; continue IV zosyn (10/23 - ), recommend discharge with 14 day total course on either IV ampicillin or daptomycin (depending on insurance and home health availability)  - repeat blood cultures from 10/25 with no growth at 48 hours- PICC team was consulted and she is triaged for PICC placement now that she has been without growth for long enough  - transplant meds: prograf, cellcept, prednisone taper back to baseline, bactrim      Prophylaxis:  - DVT: heparin (subcutaneous). SCDs.     Disposition: Floor status.   -PT/OT recs: none  -Home health needs: IV infusion for antibiotics (ampicillin vs daptomycin)    - discharge 10/28 vs 10/29 depending on timing of PICC placement and home health set up for antibiotics      Objective     Vitals:   Temp:  [36.1 ??C-36.5 ??C] 36.2 ??C  Heart Rate:  [64-72] 66  Resp:  [16-18] 16  BP: (117-140)/(61-75) 117/61  MAP (mmHg):  [87-99] 87  SpO2:  [95 %-100 %] 100 %    Physical Exam:  -General:  Appropriate, comfortable and in no apparent distress.   -Neurological: Moves all 4 extremities spontaneously.   -Cardiovascular: HDS  -Pulmonary: Normal work of breathing.   -Abdomen: Soft, non tender, non-distended. No rebound or guarding. Well healed Mercedes incision.  -Extremities: Warm, well perfused.       Gerrie Nordmann, PGY-1   General Surgery Resident   Pager: [phone]

## 2018-08-09 NOTE — Unmapped (Signed)
Multidisciplinary rounds were conducted with Allyn Kenner, MD Transplant Surgeon, Salem Senate, RN, MSN Inpatient Transplant Nurse coordinator, Belva Chimes, PA Transplant Surgery Physician Assistant, Staff PharmD, General Surgery Resident and Medical Student. Patient is Acute Care Unit status.    Discharge Plan: Pt discharging to home today as IV infusion (ampicillin abx) approved and HH services arranged. Pt has R PICC line and teaching with husband to be completed for home infusion. First dose abx at hospital. Abx end date 11/7. Pt to have twice weekly labs and f/u txp surgery clinic in 2 wks  Caryl Ada 08/09/2018 3:59 PM

## 2018-08-09 NOTE — Unmapped (Signed)
Discharge Summary    Admit date: 08/02/2018    Discharge date and time: 08/09/18    Discharge to:  Home    Discharge Service: Surg Transplant [facility])    Discharge Attending Physician: Doyce Loose,*    Discharge  Diagnoses: Elevated LFTs, cholangitis    Secondary Diagnosis: s/p liver transplant    OR Procedures:    ENDOSCOPIC RETROGRADE CHOLANGIOPANCREATOGRAPHY (ERCP); WITH PLACEMENT OF ENDOSCOPIC STENT INTO BILIARY OR PANCREATIC DUCT  Date  08/04/2018  -------------------     Ancillary Procedures: biopsy: transjugular biopsy of liver    Discharge Day Services: The patient was seen and examined by the Transplant team on the day of discharge. Vital signs and laboratory values were stable and within normal limits. Surgical wounds were examined. Discharge plan was discussed, instructions for home care were given, and all questions answered.    Subjective   No acute events overnight. Pain Controlled. No fever or chills. Ambulating without issue. Tolerating PO intake with minimal to no pain.     Objective   Patient Vitals for the past 8 hrs:   BP Temp Temp src Pulse Resp SpO2   08/09/18 0751 117/61 36.2 ??C Oral 66 16 100 %     I/O this shift:  In: 350 [P.O.:250; IV Piggyback:100]  Out: 750 [Urine:750]    General Appearance:   No acute distress  Lungs:                Clear to auscultation bilaterally  Heart:                           Regular rate and rhythm  Abdomen:                Soft, non-tender, non-distended. 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 Her course since surgery has been complicated by rheumatoid arthritis flare, maintained on Prednisone 20mg  daily, back pain, and C. Diff colitis s/p therapy.  She underwent a biopsy 07/20/2018 which was indeterminate for ACR and was started on Prednisone 40 mg daily.  Her liver enzymes had continued to elevate when seen in clinic on 08/02/2018.  She was admitted directly from clinic for repeat transjunglar biopsy and continuation of steroids.  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  Laboratory studies sent on the day of admission were notable for continued elevation of increased AST and ALT.      She underwent transjugular liver biopsy in VIR on 08/03/2018 which noted no signs of rejection. A repeat hepatic US showed elevated RIs and a mildly dilated L intrahepatic bile duct and CBD with wall thickening and dilation to 1.1cm. She became febrile on HOD1 to 39.2 so a full fever work up was completed. Blood cultures grew Entercoccus gallinarum/casseliflavus and ID was consulted to provide recommendations regarding antibiotic therapy. She was started on Zosyn.    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 A metal stent was placed into the common bile duct.    She did well thereafter and her diet was  slowly advanced and at the time of discharge she was tolerating a regular diet. The patient was able to have her pain controlled with only P.O. pain medication, and return to her preoperative ambulatory status.  Anti-rejection medication levels were monitored and dosages adjusted to maintain a therapeutic regimen. The patient was seen and assessed by Physical Therapy and deemed suitable for discharge. A PICC line was placed so she could continue to get IV antibiotics. She will be discharged on an intravenous antibiotic regimen of ampicillin to complete a 14 day course (including zosyn given while inpatient). She will also continue on prednisone and return to clinic for a post-hospitalization follow-up.    Condition at Discharge: Improved  Discharge Medications:      Medication List      START taking these medications    ampicillin 1 gram injection  Commonly known as:  OMNIPEN  Infuse 2 g into a venous catheter Every four (4) hours. for 10 days        CHANGE how you take these medications    predniSONE 10 MG tablet  Commonly known as:  DELTASONE  Take 3.5 tablets (35 mg total) by mouth daily for 6 days, THEN 3 tablets   (30 mg total) daily for 7 days, THEN 2.5 tablets (25 mg total) daily for 7   days, THEN 2 tablets (20 mg total) daily for 7 days, THEN 1.5 tablets (15   mg total) daily for 7 days, THEN 1 tablet (10 mg total) daily for 16 days.  Start taking on:  August 06, 2018  What changed:  See the new instructions.     tacrolimus 1 MG capsule  Commonly known as:  PROGRAF  Take 6 capsules (6 mg total) by mouth two (2) times a day.  What changed:    how much to take  how to take this  when to take this  additional instructions        CONTINUE taking these medications    acetaminophen 325 MG tablet  Commonly known as:  TYLENOL  TAKE 2 TABLETS BY MOUTH EVERY 4 HOURS AS NEEDED FOR PAIN. MAX 9 TABLETS   (3000 MG) PER DAY.     alendronate 70 MG tablet  Commonly known as:  FOSAMAX     cholecalciferol (vitamin D3) 2,000 unit tablet  Take 1 tablet (2,000 Units total) by mouth daily.     CITRACAL CHEW 500 mg-1,000 unit-40 mcg Chew  Generic drug:  calcium-vitamin D3-vitamin K     levothyroxine 75 MCG tablet  Commonly known as:  SYNTHROID  Take 1 tablet (75 mcg total) by mouth daily.     mycophenolate 180 MG EC tablet  Commonly known as:  MYFORTIC  Take 2 tablets (360 mg total) by mouth Two (2) times a day.     omeprazole 40 MG capsule  Commonly known as:  PriLOSEC  TAKE 1 CAPSULE BY MOUTH TWICE DAILY     polyethylene glycol 17 gram packet  Commonly known as:  MIRALAX     sulfamethoxazole-trimethoprim 400-80 mg per tablet  Commonly known as:  BACTRIM,SEPTRA  TAKE 1 TABLET BY MOUTH 3 TIMES WEEKLY     ursodiol 300 mg capsule  Commonly known as:  ACTIGALL  Take 1 capsule (300 mg total) by mouth Two (2) times a day. STOP taking these medications    traMADol 50 mg tablet  Commonly known as:  ULTRAM     valGANciclovir 450 mg tablet  Commonly known as:  VALCYTE  Pending Test Results: n/a    Discharge Instructions:  Labs and Other Follow-ups after Discharge:  Follow Up instructions and Outpatient Referrals     Discharge instructions      Activity: As tolerated    Diet: Regular    Contact your transplant coordinator or the Transplant Surgery Office [phone]) during business hours or page the transplant coordinator on call [phone]) after business hours for:    - fever >100.5 degrees F by mouth, any fever with shaking chills, or other signs or symptoms of infection   - uncontrolled nausea, vomiting, or diarrhea; inability to have a bowel movement for > 3 days.   - any problem that prevents taking medications as scheduled.   - pain uncontrolled with prescribed medication or new pain or tenderness at the surgical site   - sudden weight gain or increase in blood pressure (greater than 140/85)   - shortness of breath, chest pain / discomfort   - new or increasing jaundice   - urinary symptoms including pain / difficulty / burning or tea-colored urine   - any other new or concerning symptoms   - questions regarding your medications or continuing care      Labs and Other Follow-ups after Discharge:   Resume post-transplant labs    Transplant Coordinator:  Emilio Math- phone: [phone] fax: [phone]         Referral to Home Infusion      Performing location?:  Other    Home Health Requested Disciplines:  Nursing     **Please contact your service pharmacist for assistance with discharge home health infusion monitoring. Please see link above for Laboratory Monitoring Guidelines.      Home Health/Home Infusion Orders:     Agency Details:   Infusion Agency: [facility] at [phone]  Infusion Start of Care Date: 08/09/18    Nursing Agency: [facility] Start of Care Date: 08/09/18    [facility] Skilled Nurse to teach patient/caregiver the administration of home infusion therapy as ordered.   Please teach/review the following, as applicable: how to flush and maintain IV line, medication bag change, storage/disposal of medication/supplies, and inventory control.  Monitor for signs and symptoms of infection, phlebitis, dislodgement/catheter migration, and occlusion of IV line. Instruct to keep IV site clean, dry and intact.   Review medication side effects.   Teach how to troubleshoot device/IV line function and who to call if dressing gets dirty, wet, or lifts off.   Review and provide the patient/caregiver with contact information regarding who to contact within your agency for questions, concerns or issues.    CVAD Dressing Change:  Please change dressing weekly/prn using sterile CVAD kit/transparent dressing.   Please change Biopatch with CVAD dressing change.   Change the stabilization device (STATLOCK) as applicable with each dressing change.  Please change needleless cap at least weekly with dressing change and prn, and after every blood draw, using aseptic technique.    Note - Generic substitutions permissible by Home Infusion agencies.    IV Access:  4 FR Single lumen R Basilic vein  Arm Circumference:  28  cm  Total catheter length:  36 cm.    External length:  0 cm.  Catheter trimmed:  yes  Port reserved:  No        Vein Size:   Right arm basilic vein compressible:  Yes, measurement:  5.6 mm    Placement Date: 08/09/18    Special Instructions:     Flush Orders:  PICC Line Adult Patient PICC Line Adult Patient:   Flush with 5 mLs of Normal Saline before and after each use and prn for line maintenance. Use after blood draws. Flush with Heparin 100units/mL 2 mLs at the end of each use and prn for line maintenance. Please change needleless cap weekly and after blood draws, or prn using aseptic technique. Please flush unused lumen(s) with Heparin 100units/mL 2 mLs every day.    LABS:    IV Pcn (not oxacillin), cephalosporins, aztreonam:  Follow-up Labs (to be drawn on Mondays or Tuesdays, if possible):  Once weekly: CBC with platelets and differential, BUN, Serum Creatinine.      Name to call/fax results to:   Emilio Math- phone: [phone] fax: [phone]      Physician to Follow Patient's Care (the person listed here will be responsible for signing ongoing orders):   Name: Johna Sheriff  phone: [phone] fax: [phone]               Future Appointments:  Appointments which have been scheduled for you    Nov 08, 2018 10:20 AM EST  (Arrive by 9:50 AM)  RETURN PHARMD with Seward Speck, PharmD  [facility] TRANSPLANT SURGERY [place] [facility] REGION) [address]  [place] [address]  [phone]      Nov 08, 2018 11:30 AM EST  (Arrive by 11:00 AM)  RETURN NUTRITION with Norm Salt, RD/LDN  [facility] NUTRITION SERVICES TRANSPLANT [facility] REGION) [address]  [place] [address]  [phone]      Nov 08, 2018  1:15 PM EST  (Arrive by 12:45 PM)  RETURN 15 with Chirag Lanney Gins, MD  [facility] TRANSPLANT SURGERY [place] [facility] REGION) [address]  [place] [address]  [phone]      Jan 31, 2019 11:00 AM EDT  (Arrive by 10:30 AM)  RETURN PHARMD with Seward Speck, PharmD  [facility] TRANSPLANT SURGERY [facility] REGION) [address]  [place] [address]  [phone]      Jan 31, 2019 12:15 PM EDT  (Arrive by 11:45 AM)  RETURN 15 with Chirag Lanney Gins, MD  [facility] TRANSPLANT SURGERY [place] [facility] REGION) [address]  [place] [address]  [phone]      May 02, 2019  8:30 AM EDT  (Arrive by 8:00 AM)  XR DEXA BONE DENSITY SKELETAL with Doran Durand RM 1  IMG DEXA [facility]) [address]  [place] [address]  [phone]   [place]- No calcium supplements 24 hrs prior.     May 02, 2019  9:00 AM EDT (Arrive by 8:30 AM)  US LIVER TRANSPLANT with [facility] [place] RM 0  IMG ULTRASOUND [facility]) [address]  [place] [address]  [phone]   Preparing for the appointment:  Fasting before the appointment  You must refrain from eating or drinking prior to the appointment. However, if you are taking medications please take them as usual on the day of the procedure with a small sip of water.  Before the appointment  36-13 years old: Do not eat or drink anything for three (3) hours prior to the study.  5 years and older: Do not eat or drink anything for six (6) hours prior to the study.     May 02, 2019 10:00 AM EDT  (Arrive by 9:30 AM)  XR CHEST PA AND LATERAL with Toney Reil RM 10  IMG DIAG X-RAY [facility]) [address]  [place] [address]  [phone]      May 02, 2019 11:00 AM EDT  (Arrive by 10:30 AM)  RETURN PHARMD with Seward Speck, PharmD  [facility] TRANSPLANT SURGERY [place] [facility] REGION) [address]  [place] [address]  [phone]      May 02, 2019 12:00 PM EDT  (Arrive by 11:30 AM)  SOCIAL WORK with Coralee Rud, MSW  [facility] LIVER TRANSPLANT [facility] REGION) [address]  [place] [address]  [phone]      May 02, 2019  1:15 PM EDT  (Arrive by 12:45 PM)  RETURN 15 with Chirag Lanney Gins, MD  [facility] TRANSPLANT SURGERY [place] [facility] REGION) [address]  [place] [address]  [phone]      May 02, 2019  1:30 PM EDT  (Arrive by 1:00 PM)  RETURN NUTRITION with Norm Salt, RD/LDN  [facility] NUTRITION SERVICES TRANSPLANT [facility] REGION) [address]  [place] [address]  [phone]

## 2018-08-09 NOTE — Unmapped (Signed)
IMMUNOCOMPROMISED HOST INFECTIOUS DISEASE PROGRESS NOTE    Assessment/Plan:     Sara Sanders is a 62 y.o. 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 Initially admitted for concern for rejection in setting of elevated ALT, biopsy on 07/20/18 was indeterminate for acute cellular rejection (RAI = 2/9), subsequent biopsy in setting of persistently elevated ALT not consistent with rejection, demonstrated significant congestion, ?suboptimal blood flow suspected. While inpatient developed acute cholangitis and blood cultures 2/2 with enterococcus gallinarum (10/23). 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    ??  ID Problem List:  Autoimmune hepatitis s/p liver transplant 04/28/18, [facility] high risk donor  - Surgical complications: RA flare, maintained on pred  - Serologies: CMV D-/R+, EBV D-/R+  - Induction: steroids  - Immunosuppression: prednisone, tac mycohpenolate  - Prophylaxis: bactrim MWF, valgan  - Rejection history (delete if no h/o rejection)  ??  Pertinent Exposure History  High risk donor, repeat HIV, Hep serologies negative one month post transplant (05/28/18)  ??  Pertinent Co-morbidities  #RA  #HTN  ??  Drug Intolerances  NKDA  ??  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  Recommendations:  - continue zosyn 3.375g q6hr through 10/27  - home on ampicillin 2g q4 hours on continuous infusion pump  - please follow weekly CMV serum PCR while off valgancyclovir (presumably held for thrombocytopenia)    Solid Organ Transplant Infectious Diseases Follow-up Instructions  - Appointment: requested 08/09/18  - Location: [facility]th [facility], [address] [place], [place]  - Labs: weekly CBC with differential, BUN, SCr  - Please fax labs to patient???s transplant coordinator: Genia Harold at  [phone] (Liver)  - Antibiotics:   (a) Ampicillin 2g q4 hours on continuous infusion pump, End Date: Date 08/17/18      The [facility] ID service will continue to follow.  Please page the ID Transplant/Liquid Oncology Fellow consult at [phone] with questions.  Patient discussed with Dr. Reynold Bowen.    Terrall Laity, MD, PhD  Fellow, [facility] Infectious Diseases  Pager:  [phone]      Subjective:     Interval History:    History obtained from:patient and family member.    No complaints. AF. No abd pain. Some diarrhea but reports was having this prior to admission as well and attributes to one of her other meds.    Medications:  Antimicrobials:  zosyn    Prior/Current immunomodulators:   prednisone, tac mycohpenolate    Other medications reviewed.    Objective:     Vital Signs last 24 hours:  Temp:  [36.1 ??C-36.5 ??C] 36.2 ??C  Heart Rate:  [66-72] 66  Resp:  [16-18] 16  BP: (117-130)/(61-75) 117/61  MAP (mmHg):  [87-92] 87  SpO2:  [95 %-100 %] 100 %    Physical Exam:  Patient Lines/Drains/Airways Status    Active Active Lines, Drains, & Airways     Name:   Placement date:   Placement time:   Site:   Days:    PICC Single Lumen  08/09/18 Right Basilic   08/09/18    0938    Basilic   less than 1    Peripheral IV 08/07/18 Right Forearm   08/07/18    0920    Forearm   2              Gen: pleasant woman in NAD, comfortable, on RA  HEENT: PERRL, OP nl  LAD: no supraclavicular, cervical,  LAD  Pulm: nl work of breathing  CV: RRR without M/R/G  Abd: soft/NT/ND +BS  Ext: no LEE, LEs w/wp  Neuro: AAOx3, full sentences, no focal deficits.  Psych: normal mood/affect  Access: peripheral appears uninfected    Labs:  Lab Results   Component Value Date    WBC 4.8 08/09/2018    WBC 4.2 (L) 08/08/2018    WBC 4.1 (L) 08/07/2018    WBC 2.1 (LL) 03/17/2018    WBC 2.2 (LL) 03/03/2018    WBC 2.8 (L) 02/23/2018    HGB 11.1 (L) 08/09/2018    HGB 9.8 (L) 03/17/2018    HCT 34.0 (L) 08/09/2018    HCT 27.3 (L) 03/17/2018    Platelet 121 (L) 08/09/2018    Platelet 144 (L) 03/17/2018    Absolute Neutrophils 3.7 08/02/2018    Absolute Neutrophils 1.1 (L) 03/17/2018    Absolute Lymphocytes 0.8 (L) 08/02/2018    Absolute Lymphocytes 0.8 03/17/2018    Absolute Eosinophils 0.1 08/02/2018    Absolute Eosinophils 0.0 03/17/2018    Sodium 138 08/09/2018    Sodium 133 (L) 03/17/2018    Potassium 3.8 08/09/2018    Potassium 3.6 03/17/2018    BUN 14 08/09/2018    BUN 6 (L) 03/17/2018    Creatinine 0.99 08/09/2018    Creatinine 0.95 08/08/2018    Creatinine 0.96 08/07/2018    Creatinine 0.70 03/17/2018    Creatinine 0.60 03/03/2018    Creatinine 0.75 02/23/2018    Glucose 96 08/09/2018    Glucose 86 03/17/2018    Magnesium 1.4 (L) 08/09/2018    Albumin 2.9 (L) 08/09/2018    Total Bilirubin 0.7 08/09/2018    Total Bilirubin 3.6 (H) 03/17/2018    AST 18 08/09/2018    AST 65 (H) 03/17/2018    ALT 76 (H) 08/09/2018    ALT 31 03/17/2018    Alkaline Phosphatase 57 08/09/2018    Alkaline Phosphatase 87 03/17/2018    INR 0.97 08/03/2018    INR 1.6 (H) 03/17/2018       Microbiology:  Past cultures were reviewed in Epic and CareEverywhere.    Imaging:   No new imaging

## 2018-08-09 NOTE — Unmapped (Signed)
Tacrolimus Therapeutic Monitoring Pharmacy Note    Sara Sanders is a 62 y.o. female continuing tacrolimus.     Indication: Liver transplant     Date of Transplant: 04/28/2018      Prior Dosing Information: Current regimen 6 mg bid      Goals:  Therapeutic Drug Levels  Tacrolimus trough goal: 8-10 ng/mL    Additional Clinical Monitoring/Outcomes  ?? Monitor renal function (SCr and urine output) and liver function (LFTs)  ?? Monitor for signs/symptoms of adverse events (e.g., hyperglycemia, hyperkalemia, hypomagnesemia, hypertension, headache, tremor)    Results:   Tacrolimus level: 7.7 ng/mL, drawn appropriately    Pharmacokinetic Considerations and Significant Drug Interactions:  ? Concurrent hepatotoxic medications: None identified  ? Concurrent CYP3A4 substrates/inhibitors: None identified  ? Concurrent nephrotoxic medications: None identified    Assessment/Plan:  Recommendedation(s)  ? Continue current regimen of 6 mg bid.    Follow-up  ? Daily Tac levels prior to AM dose.   ? A pharmacist will continue to monitor and recommend levels as appropriate    Please page service pharmacist with questions/clarifications.    Rubie Maid, PharmD

## 2018-08-09 NOTE — Unmapped (Signed)
Patient discharged from hospital today. Per Dr.Serrano, she should return in 2 wks for a f/u. Contacted [facility] for scheduling.

## 2018-08-09 NOTE — Unmapped (Signed)
Met with patient today and was a bit tearful stating my transplant admission was easy compared to this.... I offered empathy and support, she talked about her 1 yr grandaughter and showed me pictures. Pt is looking forward to getting home. Pt HH infusion services being arranged by Marylene Land, CM. Husband is coming later today and will assist with IV abx at home. Will continue to follow   Caryl Ada Inpatient Transplant Nurse Coordinator 08/09/2018 11:00 AM

## 2018-08-09 NOTE — Unmapped (Signed)
Pt given poc update, meds reviewed, pain assessment noted with prn meds as needed given, pt assessments competed see flow sheets, pt dc plans as known at this time reviewed with pt, all questions answered.    Problem: Adult Inpatient Plan of Care  Goal: Plan of Care Review  Outcome: Progressing  Goal: Patient-Specific Goal (Individualization)  Outcome: Progressing  Goal: Absence of Hospital-Acquired Illness or Injury  Outcome: Progressing  Goal: Optimal Comfort and Wellbeing  Outcome: Progressing  Goal: Readiness for Transition of Care  Outcome: Progressing  Goal: Rounds/Family Conference  Outcome: Progressing

## 2018-08-09 NOTE — Unmapped (Signed)
See previous note by this RN.

## 2018-08-09 NOTE — Unmapped (Signed)
Plan of care reviewed at beginning of shift and as needed.No falls this shift.Ambulates independently . VS stable.Tolerating diet.Denies pain .Urine output adequate.BM this afternoonAnticoagulant given as prescribed.All questions answered at this time .Will continue to monitor.    Problem: Adult Inpatient Plan of Care  Goal: Plan of Care Review  Outcome: Progressing  Goal: Patient-Specific Goal (Individualization)  Outcome: Progressing  Goal: Absence of Hospital-Acquired Illness or Injury  Outcome: Progressing  Goal: Optimal Comfort and Wellbeing  Outcome: Progressing  Goal: Readiness for Transition of Care  Outcome: Progressing  Goal: Rounds/Family Conference  Outcome: Progressing     Problem: Pain Acute  Goal: Optimal Pain Control  Outcome: Progressing     Problem: Infection  Goal: Infection Symptom Resolution  Outcome: Progressing     Problem: Hypertension Comorbidity  Goal: Blood Pressure in Desired Range  Outcome: Progressing

## 2018-08-09 NOTE — Unmapped (Signed)
PICC LINE INSERTION PROCEDURE NOTE    Indications:  Antibiotic Therapy    Consent/Time Out:    Risks, benefits and alternatives discussed with patient.  Written consent was obtained prior to the procedure and is detailed in the medical record.  Prior to the start of the procedure, a time out was taken and the identity of the patient was confirmed via name, medical record number and  date of birth.  The availability of the correct equipment was verified.    Procedure Details:  The vein was identified and measured for appropriate catheter length. Maximum sterile techniques was utilized.  Sterile field prepared with necessary supplies and equipment. Insertion site was prepped with chlorhexidine solution and allowed to dry. Lidocaine 2 mL subcutaneously and intradermally administered to insertion site.  The catheter was primed with normal saline.  A 4 FR Single lumen was inserted to the R Basilic vein with 1 insertion attempt(s).  Catheter aspirated, 5 mL blood return present.  The catheter was then flushed with 10 mL of normal saline.   Insertion site cleansed, and sterile dressing applied per manufacturer guidelines. The Central Line Checklist was referenced.  The catheter was inserted without difficulty by Lorelle Formosa RN and assisted by Elizabeth Palau RN.      Findings:  Manufacturer:  Bard  Lot #:  [id number]  CT Injectable (power):  Yes  Arm Circumference:  28  cm  Total catheter length:  36 cm.    External length:  0 cm.  Catheter trimmed:  yes  Port reserved:  No       Vein Size:   Right arm basilic vein compressible:  Yes, measurement:  5.6 mm      Tip Placement Verification:   3[employer]    Workup Time:    45 minutes    Recommendations:  PICC Brochure given to patient with teaching instruction.      See vein image below:

## 2018-08-10 MED ORDER — CHOLECALCIFEROL (VITAMIN D3) 50 MCG (2,000 UNIT) TABLET
ORAL_TABLET | Freq: Every day | ORAL | 3 refills | 0.00000 days | Status: CP
Start: 2018-08-10 — End: 2019-01-31

## 2018-08-10 NOTE — Unmapped (Addendum)
Patient contacted coordinator to inquire if she should be continuing to take Valcyte and to say she had not received the Vit D she was prescribed while inpatient.Contacted txp pharmacist, [name], who verified that the patient no longer needed Valcyte, since her titers were negative-monitoring would continue per mod.risk protocol. Patient reports she will be on the abx infusion daily until 11/7, when the nurse would return to gather the pump and remove her picc line. She said there were no plans for them to draw blood in home. Per d/c instructions, ltd labs were ordered once weekly.Inpt.coordinator said patient was directed to complete labs twice weekly on M/Th.Contacted patient and verified twice weekly lab interval for M/Th. Also explained that GI procedures will schedule her stent removal to be completed in about 6 mos. She verbalized understanding.

## 2018-08-11 NOTE — Unmapped (Signed)
Sara Lanney Gins, MD   Transplant    Tracking chart. Order generated for egd due to recall letter  Most recent egd done 07/09/2017 , with repeat due to 06/2018 to recheck varices.      Does this patient need repeat EGD at this time ?  Thank you  [name]

## 2018-08-12 ENCOUNTER — Ambulatory Visit: Admit: 2018-08-12 | Discharge: 2018-08-13 | Payer: PRIVATE HEALTH INSURANCE

## 2018-08-12 DIAGNOSIS — E612 Magnesium deficiency: Secondary | ICD-10-CM

## 2018-08-12 DIAGNOSIS — Z944 Liver transplant status: Principal | ICD-10-CM

## 2018-08-12 DIAGNOSIS — Z5181 Encounter for therapeutic drug level monitoring: Secondary | ICD-10-CM

## 2018-08-12 LAB — CBC W/ AUTO DIFF
BASOPHILS ABSOLUTE COUNT: 0.1 10*9/L (ref 0.0–0.1)
BASOPHILS RELATIVE PERCENT: 1.9 %
EOSINOPHILS ABSOLUTE COUNT: 0.1 10*9/L (ref 0.0–0.4)
EOSINOPHILS RELATIVE PERCENT: 1.6 %
HEMATOCRIT: 36.7 % (ref 36.0–46.0)
HEMOGLOBIN: 12.3 g/dL — ABNORMAL LOW (ref 13.5–16.0)
LARGE UNSTAINED CELLS: 1 % (ref 0–4)
LYMPHOCYTES ABSOLUTE COUNT: 0.8 10*9/L — ABNORMAL LOW (ref 1.5–5.0)
LYMPHOCYTES RELATIVE PERCENT: 14.2 %
MEAN CORPUSCULAR HEMOGLOBIN CONC: 33.5 g/dL (ref 31.0–37.0)
MEAN CORPUSCULAR HEMOGLOBIN: 32.8 pg (ref 26.0–34.0)
MEAN CORPUSCULAR VOLUME: 97.7 fL (ref 80.0–100.0)
MEAN PLATELET VOLUME: 8.6 fL (ref 7.0–10.0)
MONOCYTES ABSOLUTE COUNT: 0.2 10*9/L (ref 0.2–0.8)
NEUTROPHILS ABSOLUTE COUNT: 4.2 10*9/L (ref 2.0–7.5)
PLATELET COUNT: 141 10*9/L — ABNORMAL LOW (ref 150–440)
RED BLOOD CELL COUNT: 3.76 10*12/L — ABNORMAL LOW (ref 4.00–5.20)
RED CELL DISTRIBUTION WIDTH: 14.6 % (ref 12.0–15.0)

## 2018-08-12 LAB — COMPREHENSIVE METABOLIC PANEL
ALBUMIN: 3.4 g/dL — ABNORMAL LOW (ref 3.5–5.0)
ALKALINE PHOSPHATASE: 54 U/L (ref 38–126)
ALT (SGPT): 49 U/L — ABNORMAL HIGH (ref <=35)
ANION GAP: 8 mmol/L (ref 7–15)
AST (SGOT): 23 U/L (ref 14–38)
BLOOD UREA NITROGEN: 12 mg/dL (ref 7–21)
BUN / CREAT RATIO: 13
CHLORIDE: 107 mmol/L (ref 98–107)
CO2: 28 mmol/L (ref 22.0–30.0)
CREATININE: 0.92 mg/dL (ref 0.60–1.00)
EGFR CKD-EPI AA FEMALE: 78 mL/min/{1.73_m2} (ref >=60)
EGFR CKD-EPI NON-AA FEMALE: 68 mL/min/{1.73_m2} (ref >=60)
GLUCOSE RANDOM: 89 mg/dL (ref 65–179)
POTASSIUM: 3.7 mmol/L (ref 3.5–5.0)
PROTEIN TOTAL: 5.6 g/dL — ABNORMAL LOW (ref 6.5–8.3)
SODIUM: 143 mmol/L (ref 135–145)

## 2018-08-12 LAB — MEAN CORPUSCULAR VOLUME: Lab: 97.7

## 2018-08-12 LAB — SLIDE REVIEW

## 2018-08-12 LAB — MAGNESIUM: Magnesium:MCnc:Pt:Ser/Plas:Qn:: 1.8

## 2018-08-12 LAB — PHOSPHORUS: Phosphate:MCnc:Pt:Ser/Plas:Qn:: 4.1

## 2018-08-12 LAB — CO2: Carbon dioxide:SCnc:Pt:Ser/Plas:Qn:: 28

## 2018-08-12 LAB — TOXIC GRANULATION

## 2018-08-12 LAB — BILIRUBIN DIRECT: Bilirubin.glucuronidated:MCnc:Pt:Ser/Plas:Qn:: 0.4

## 2018-08-12 LAB — GAMMA GLUTAMYL TRANSFERASE: Gamma glutamyl transferase:CCnc:Pt:Ser/Plas:Qn:: 158 — ABNORMAL HIGH

## 2018-08-12 NOTE — Unmapped (Signed)
error 

## 2018-08-13 LAB — TACROLIMUS, TROUGH: Lab: 6.1

## 2018-08-16 ENCOUNTER — Ambulatory Visit: Admit: 2018-08-16 | Discharge: 2018-08-17 | Payer: PRIVATE HEALTH INSURANCE

## 2018-08-16 DIAGNOSIS — E612 Magnesium deficiency: Secondary | ICD-10-CM

## 2018-08-16 DIAGNOSIS — Z5181 Encounter for therapeutic drug level monitoring: Secondary | ICD-10-CM

## 2018-08-16 DIAGNOSIS — Z944 Liver transplant status: Principal | ICD-10-CM

## 2018-08-16 LAB — CBC W/ AUTO DIFF
BASOPHILS ABSOLUTE COUNT: 0.1 10*9/L (ref 0.0–0.1)
BASOPHILS RELATIVE PERCENT: 1.1 %
EOSINOPHILS ABSOLUTE COUNT: 0.1 10*9/L (ref 0.0–0.4)
EOSINOPHILS RELATIVE PERCENT: 1.9 %
HEMATOCRIT: 37.8 % (ref 36.0–46.0)
LARGE UNSTAINED CELLS: 2 % (ref 0–4)
LYMPHOCYTES ABSOLUTE COUNT: 0.8 10*9/L — ABNORMAL LOW (ref 1.5–5.0)
LYMPHOCYTES RELATIVE PERCENT: 19.1 %
MEAN CORPUSCULAR HEMOGLOBIN: 32.5 pg (ref 26.0–34.0)
MEAN CORPUSCULAR VOLUME: 98.2 fL (ref 80.0–100.0)
MEAN PLATELET VOLUME: 7.9 fL (ref 7.0–10.0)
MONOCYTES ABSOLUTE COUNT: 0.2 10*9/L (ref 0.2–0.8)
NEUTROPHILS ABSOLUTE COUNT: 2.9 10*9/L (ref 2.0–7.5)
NEUTROPHILS RELATIVE PERCENT: 70.8 %
PLATELET COUNT: 132 10*9/L — ABNORMAL LOW (ref 150–440)
RED BLOOD CELL COUNT: 3.85 10*12/L — ABNORMAL LOW (ref 4.00–5.20)
RED CELL DISTRIBUTION WIDTH: 14.5 % (ref 12.0–15.0)
WBC ADJUSTED: 4.1 10*9/L — ABNORMAL LOW (ref 4.5–11.0)

## 2018-08-16 LAB — COMPREHENSIVE METABOLIC PANEL
ALBUMIN: 3.4 g/dL — ABNORMAL LOW (ref 3.5–5.0)
ALKALINE PHOSPHATASE: 55 U/L (ref 38–126)
ALT (SGPT): 57 U/L — ABNORMAL HIGH (ref <35)
AST (SGOT): 31 U/L (ref 14–38)
BILIRUBIN TOTAL: 0.8 mg/dL (ref 0.0–1.2)
BUN / CREAT RATIO: 14
CALCIUM: 8.9 mg/dL (ref 8.5–10.2)
CHLORIDE: 108 mmol/L — ABNORMAL HIGH (ref 98–107)
CO2: 30 mmol/L (ref 22.0–30.0)
CREATININE: 0.93 mg/dL (ref 0.60–1.00)
EGFR CKD-EPI AA FEMALE: 77 mL/min/{1.73_m2} (ref >=60)
GLUCOSE RANDOM: 80 mg/dL (ref 65–179)
POTASSIUM: 4 mmol/L (ref 3.5–5.0)
PROTEIN TOTAL: 5.8 g/dL — ABNORMAL LOW (ref 6.5–8.3)
SODIUM: 144 mmol/L (ref 135–145)

## 2018-08-16 LAB — MAGNESIUM: Magnesium:MCnc:Pt:Ser/Plas:Qn:: 1.8

## 2018-08-16 LAB — SMEAR REVIEW

## 2018-08-16 LAB — GAMMA GLUTAMYL TRANSFERASE: Gamma glutamyl transferase:CCnc:Pt:Ser/Plas:Qn:: 125 — ABNORMAL HIGH

## 2018-08-16 LAB — PHOSPHORUS: Phosphate:MCnc:Pt:Ser/Plas:Qn:: 3.9

## 2018-08-16 LAB — BILIRUBIN DIRECT: Bilirubin.glucuronidated:MCnc:Pt:Ser/Plas:Qn:: 0.4

## 2018-08-16 LAB — ANION GAP: Anion gap 3:SCnc:Pt:Ser/Plas:Qn:: 6 — ABNORMAL LOW

## 2018-08-16 LAB — PLATELET COUNT: Lab: 132 — ABNORMAL LOW

## 2018-08-17 LAB — COMPREHENSIVE METABOLIC PANEL

## 2018-08-17 LAB — CBC W/ DIFFERENTIAL
BASOPHILS ABSOLUTE COUNT: 0 10*9/L
EOSINOPHILS ABSOLUTE COUNT: 0 10*9/L
HEMATOCRIT: 38 %
HEMOGLOBIN: 12.2 g/dL
LYMPHOCYTES ABSOLUTE COUNT: 0.4 10*9/L — ABNORMAL LOW
MONOCYTES ABSOLUTE COUNT: 0.1 10*9/L — ABNORMAL LOW
PLATELET COUNT: 138 10*9/L — ABNORMAL LOW
WBC ADJUSTED: 4.9 10*9/L

## 2018-08-17 LAB — SLIDE SCAN: Lab: 0

## 2018-08-17 LAB — TACROLIMUS, TROUGH: Lab: 7.9

## 2018-08-17 LAB — EGFR MDRD NON AF AMER: Lab: 0

## 2018-08-17 NOTE — Unmapped (Signed)
[facility] Refill Coordination Note    Specialty Medication(s) to be Shipped:   Transplant: None    Other medication(s) to be shipped:   ursodiol (ACTIGALL) 300 mg   sulfamethoxazole-trimethoprim (BACTRIM,SEPTRA) 400-80 mg    **PT reports MD is taking her off of Valcyte.      Sara Sanders, DOB: 10-Dec-1955  Phone: [phone] (home)       All above HIPAA information was verified with patient.     Completed refill call assessment today to schedule patient's medication shipment from the [facility] [phone]).       Specialty medication(s) and dose(s) confirmed: Regimen is correct and unchanged.   Changes to medications: Sara Sanders reports no changes reported at this time.  Changes to insurance: No  Questions for the pharmacist: No    The patient will receive a drug information handout for each medication shipped and additional FDA Medication Guides as required.      DISEASE/MEDICATION-SPECIFIC INFORMATION        N/A    ADHERENCE     Medication Adherence    Patient reported X missed doses in the last month:  0                          MEDICARE PART B DOCUMENTATION     Not Applicable    SHIPPING     Shipping address confirmed in Epic.     Delivery Scheduled: Yes, Expected medication delivery date: 08/20/18 via UPS or courier.     Medication will be delivered via UPS to the home address in Epic WAM.    Sara Sanders   [facility] Specialty Technician

## 2018-08-19 ENCOUNTER — Ambulatory Visit: Admit: 2018-08-19 | Discharge: 2018-08-20 | Payer: PRIVATE HEALTH INSURANCE

## 2018-08-19 DIAGNOSIS — Z5181 Encounter for therapeutic drug level monitoring: Secondary | ICD-10-CM

## 2018-08-19 DIAGNOSIS — Z944 Liver transplant status: Principal | ICD-10-CM

## 2018-08-19 DIAGNOSIS — E612 Magnesium deficiency: Secondary | ICD-10-CM

## 2018-08-19 LAB — CBC W/ AUTO DIFF
BASOPHILS ABSOLUTE COUNT: 0.1 10*9/L (ref 0.0–0.1)
BASOPHILS RELATIVE PERCENT: 2.1 %
EOSINOPHILS ABSOLUTE COUNT: 0.1 10*9/L (ref 0.0–0.4)
EOSINOPHILS RELATIVE PERCENT: 1.2 %
HEMATOCRIT: 38.5 % (ref 36.0–46.0)
HEMOGLOBIN: 13 g/dL — ABNORMAL LOW (ref 13.5–16.0)
LARGE UNSTAINED CELLS: 2 % (ref 0–4)
LYMPHOCYTES ABSOLUTE COUNT: 0.9 10*9/L — ABNORMAL LOW (ref 1.5–5.0)
LYMPHOCYTES RELATIVE PERCENT: 21 %
MEAN CORPUSCULAR VOLUME: 97.5 fL (ref 80.0–100.0)
MEAN PLATELET VOLUME: 8 fL (ref 7.0–10.0)
MONOCYTES ABSOLUTE COUNT: 0.1 10*9/L — ABNORMAL LOW (ref 0.2–0.8)
MONOCYTES RELATIVE PERCENT: 3.2 %
NEUTROPHILS ABSOLUTE COUNT: 3 10*9/L (ref 2.0–7.5)
NEUTROPHILS RELATIVE PERCENT: 70.3 %
PLATELET COUNT: 112 10*9/L — ABNORMAL LOW (ref 150–440)
RED BLOOD CELL COUNT: 3.95 10*12/L — ABNORMAL LOW (ref 4.00–5.20)
RED CELL DISTRIBUTION WIDTH: 14.6 % (ref 12.0–15.0)
WBC ADJUSTED: 4.2 10*9/L — ABNORMAL LOW (ref 4.5–11.0)

## 2018-08-19 LAB — COMPREHENSIVE METABOLIC PANEL
ALT (SGPT): 82 U/L — ABNORMAL HIGH (ref <35)
ANION GAP: 8 mmol/L (ref 7–15)
AST (SGOT): 36 U/L (ref 14–38)
BILIRUBIN TOTAL: 0.7 mg/dL (ref 0.0–1.2)
BLOOD UREA NITROGEN: 15 mg/dL (ref 7–21)
BUN / CREAT RATIO: 16
CALCIUM: 9 mg/dL (ref 8.5–10.2)
CHLORIDE: 107 mmol/L (ref 98–107)
CO2: 28 mmol/L (ref 22.0–30.0)
CREATININE: 0.96 mg/dL (ref 0.60–1.00)
EGFR CKD-EPI AA FEMALE: 74 mL/min/{1.73_m2} (ref >=60)
EGFR CKD-EPI NON-AA FEMALE: 64 mL/min/{1.73_m2} (ref >=60)
GLUCOSE RANDOM: 86 mg/dL (ref 65–179)
POTASSIUM: 3.3 mmol/L — ABNORMAL LOW (ref 3.5–5.0)
PROTEIN TOTAL: 5.9 g/dL — ABNORMAL LOW (ref 6.5–8.3)
SODIUM: 143 mmol/L (ref 135–145)

## 2018-08-19 LAB — BILIRUBIN DIRECT: Bilirubin.glucuronidated:MCnc:Pt:Ser/Plas:Qn:: 0.3

## 2018-08-19 LAB — PHOSPHORUS: Phosphate:MCnc:Pt:Ser/Plas:Qn:: 3.9

## 2018-08-19 LAB — GAMMA GLUTAMYL TRANSFERASE: Gamma glutamyl transferase:CCnc:Pt:Ser/Plas:Qn:: 107 — ABNORMAL HIGH

## 2018-08-19 LAB — CALCIUM: Calcium:MCnc:Pt:Ser/Plas:Qn:: 9

## 2018-08-19 LAB — MAGNESIUM: Magnesium:MCnc:Pt:Ser/Plas:Qn:: 1.7

## 2018-08-19 LAB — MONOCYTES ABSOLUTE COUNT: Lab: 0.1 — ABNORMAL LOW

## 2018-08-19 MED FILL — SULFAMETHOXAZOLE 400 MG-TRIMETHOPRIM 80 MG TABLET: ORAL | 28 days supply | Qty: 12 | Fill #3

## 2018-08-19 MED FILL — URSODIOL 300 MG CAPSULE: 30 days supply | Qty: 60 | Fill #3 | Status: AC

## 2018-08-19 MED FILL — URSODIOL 300 MG CAPSULE: ORAL | 30 days supply | Qty: 60 | Fill #3

## 2018-08-19 MED FILL — SULFAMETHOXAZOLE 400 MG-TRIMETHOPRIM 80 MG TABLET: 28 days supply | Qty: 12 | Fill #3 | Status: AC

## 2018-08-19 NOTE — Unmapped (Signed)
Received request from [facility] to contact Sara Sanders @ [phone] to confirm antibiotic stop date.  Reviewed recent discharge summary and ID note with [name], PharmD who confirms antibiotic course was to stop today - per [facility] will pull PICC line tomorrow @ patient home as previously ordered.  Also reviewed 11/7 lab results (tac level pending) with uptrending ALT to 82 - per Vernona Rieger no changes at this time will follow pending tac level to assess for potential adjustments.        Spoke with patient by phone to relay continuation of original plans to STOP antibiotics today with [facility] RN to pull line tomorrow - she verbalized understanding.  Patient reports feeling well with no recent fevers.  She is aware of follow-up visits next week on Monday.

## 2018-08-20 LAB — TACROLIMUS, TROUGH: Lab: 7.1

## 2018-08-20 NOTE — Unmapped (Signed)
Patient's K low with 11/7 labs, ALT rising as well. Patient again states she feels fine and denies any new medication or recent illness. She has stopped the IV abx infusions an had picc line removed today. Encouraged her to increase her intake of K rich foods. She verbalized agreement, will monitor labs again on Monday.

## 2018-08-23 ENCOUNTER — Ambulatory Visit: Admit: 2018-08-23 | Discharge: 2018-08-24 | Payer: PRIVATE HEALTH INSURANCE

## 2018-08-23 ENCOUNTER — Institutional Professional Consult (permissible substitution): Admit: 2018-08-23 | Discharge: 2018-08-23 | Payer: PRIVATE HEALTH INSURANCE

## 2018-08-23 ENCOUNTER — Ambulatory Visit: Admit: 2018-08-23 | Discharge: 2018-08-23 | Payer: PRIVATE HEALTH INSURANCE

## 2018-08-23 DIAGNOSIS — Z79899 Other long term (current) drug therapy: Secondary | ICD-10-CM

## 2018-08-23 DIAGNOSIS — Z789 Other specified health status: Secondary | ICD-10-CM

## 2018-08-23 DIAGNOSIS — Z944 Liver transplant status: Principal | ICD-10-CM

## 2018-08-23 LAB — CBC W/ AUTO DIFF
BASOPHILS ABSOLUTE COUNT: 0.1 10*9/L (ref 0.0–0.1)
BASOPHILS RELATIVE PERCENT: 1.1 %
EOSINOPHILS RELATIVE PERCENT: 0.7 %
HEMATOCRIT: 41.7 % (ref 36.0–46.0)
HEMOGLOBIN: 13.5 g/dL (ref 12.0–16.0)
LARGE UNSTAINED CELLS: 1 % (ref 0–4)
LYMPHOCYTES ABSOLUTE COUNT: 0.9 10*9/L — ABNORMAL LOW (ref 1.5–5.0)
LYMPHOCYTES RELATIVE PERCENT: 15.9 %
MEAN CORPUSCULAR HEMOGLOBIN CONC: 32.4 g/dL (ref 31.0–37.0)
MEAN CORPUSCULAR HEMOGLOBIN: 31.4 pg (ref 26.0–34.0)
MEAN CORPUSCULAR VOLUME: 96.7 fL (ref 80.0–100.0)
MEAN PLATELET VOLUME: 7.6 fL (ref 7.0–10.0)
MONOCYTES ABSOLUTE COUNT: 0.2 10*9/L (ref 0.2–0.8)
MONOCYTES RELATIVE PERCENT: 4.3 %
NEUTROPHILS ABSOLUTE COUNT: 4.4 10*9/L (ref 2.0–7.5)
NEUTROPHILS RELATIVE PERCENT: 76.6 %
PLATELET COUNT: 115 10*9/L — ABNORMAL LOW (ref 150–440)
RED BLOOD CELL COUNT: 4.31 10*12/L (ref 4.00–5.20)
WBC ADJUSTED: 5.7 10*9/L (ref 4.5–11.0)

## 2018-08-23 LAB — COMPREHENSIVE METABOLIC PANEL
ALKALINE PHOSPHATASE: 72 U/L (ref 38–126)
ALT (SGPT): 113 U/L — ABNORMAL HIGH (ref <35)
ANION GAP: 9 mmol/L (ref 7–15)
AST (SGOT): 43 U/L — ABNORMAL HIGH (ref 14–38)
BILIRUBIN TOTAL: 0.8 mg/dL (ref 0.0–1.2)
BUN / CREAT RATIO: 16
CALCIUM: 9 mg/dL (ref 8.5–10.2)
CHLORIDE: 101 mmol/L (ref 98–107)
CO2: 30 mmol/L (ref 22.0–30.0)
CREATININE: 1.07 mg/dL — ABNORMAL HIGH (ref 0.60–1.00)
EGFR CKD-EPI AA FEMALE: 65 mL/min/{1.73_m2} (ref >=60)
EGFR CKD-EPI NON-AA FEMALE: 57 mL/min/{1.73_m2} — ABNORMAL LOW (ref >=60)
GLUCOSE RANDOM: 80 mg/dL (ref 65–99)
POTASSIUM: 3.4 mmol/L — ABNORMAL LOW (ref 3.5–5.0)
PROTEIN TOTAL: 6.4 g/dL — ABNORMAL LOW (ref 6.5–8.3)
SODIUM: 140 mmol/L (ref 135–145)

## 2018-08-23 LAB — BILIRUBIN DIRECT: Bilirubin.glucuronidated:MCnc:Pt:Ser/Plas:Qn:: 0.2

## 2018-08-23 LAB — LYMPHOCYTES RELATIVE PERCENT: Lab: 15.9

## 2018-08-23 LAB — GAMMA GLUTAMYL TRANSFERASE: Gamma glutamyl transferase:CCnc:Pt:Ser/Plas:Qn:: 117 — ABNORMAL HIGH

## 2018-08-23 LAB — TACROLIMUS BLOOD: Lab: 8

## 2018-08-23 LAB — PHOSPHORUS: Phosphate:MCnc:Pt:Ser/Plas:Qn:: 4.4

## 2018-08-23 LAB — MAGNESIUM: Magnesium:MCnc:Pt:Ser/Plas:Qn:: 1.8

## 2018-08-23 LAB — CMV DNA, QUANTITATIVE, PCR
CMV QUANT: <50 [IU]/mL — ABNORMAL HIGH (ref <0)
CMV VIRAL LD: DETECTED — AB

## 2018-08-23 LAB — SMEAR REVIEW

## 2018-08-23 LAB — SLIDE REVIEW

## 2018-08-23 LAB — CMV QUANT LOG10: Lab: 0

## 2018-08-23 LAB — EGFR CKD-EPI AA FEMALE: Lab: 65

## 2018-08-23 MED ORDER — PREDNISONE 10 MG TABLET
ORAL_TABLET | Freq: Every day | ORAL | 11 refills | 0.00000 days | Status: CP
Start: 2018-08-23 — End: 2018-09-07

## 2018-08-23 NOTE — Unmapped (Signed)
TRANSPLANT SURGERY PROGRESS NOTE    Assessment and Plan  Sara Sanders is a 62 y.o. 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 Her course since surgery has been complicated by rheumatoid arthritis flare, maintained on Prednisone, back pain w/ compression fracture, and C. Diff colitis s/p therapy. ALT elevated to 113 today following recent hospitalization on 21-Oct with liver biopsy and ERCP for stent placement.    1. Elevated Liver Enzymes: ALT 113 today which is up from 82 several days ago.  Concern for stricture vs. Rejection in the setting of decreased prednisone dose after hospital discharge.  -Recheck liver enzyme on Wed 13-Nov with low threshold for admission/repeat biopsy if concern for rejection  -Medication adjustments as follows:    2. Immunosuppression:  -Tacrolimus 6 mg in AM and 6 mg in PM. Tacro trough goal 8-10.  -Continue current Myfortic 180 mg BID  -Increase Prednisone to 30 mg daily for now until next liver labs/biopsy  -While on prednisone continue calcium, vitamin D and PPI BID for bone health and ulcer prevention.     3. ID Prophylaxis:  -PCP: Bactrim M/W/F until 10/30/19    4. Rheumatoid Arthritis: Longstanding history of steroids and 6-MP for autoimmune hepatitis. Was on Simponi for her RA which is now on hold in setting of current dose titration of immunosuppression.   -Prednisone as above    5. 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 Preventative Health:   -Hyperlipidemia/Hypertension/DM*:   Hold ASA 81 mg daily until next liver labs, may need biopsy  - Bone Health:  Osteoporosis, on calcium plus vitamin D, and Fosamax.    Repeat DEXA annually  - Dermatology: This patient is at increased risk for the development of skin cancers due to immunosuppressant medications. We recommend yearly surveillance. The patient has been informed of the same.  - CRC screening: Colonoscopy.  -Women's Health: Mammogram and PAP smear are up to date per patient report    7. Vaccinations: We recommend that patients have vaccinations to prevent various infections that can occur, especially in the setting of having underlying liver disease. The following vaccinations should be given:  -Hepatitis A: immunized  -Hepatitis B: non-immune. Will need repeat HBV series at some point after 3 months.   -Influenza (yearly): will give today  -Pneumococcal: PPSV23 and PCV13 will be needed  -Zoster: Shingrix (age > 50) indicated at some point after 3 months.     Subjective  Sara Sanders is a 62 y.o. 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 Her course since surgery has been complicated by rheumatoid arthritis flare, maintained on Prednisone 20mg  daily, back pain, and C. Diff colitis s/p therapy.     Interval Events:  Patient was last seen in clinic on 21-Oct, with elevated liver enzymes concerning for rejection and plan to admit for expedited workup including liver biopsy.  She underwent biopsy in [facility] one 22-Oct which showed no signs of rejection, RAI 1-2.  Hepatic U/S showed dilated L intrahepatic bile duct and CBD with wall thickening and diameter 1.1, therefore she underwent ERCP with a stent placed in the common  bile duct.  On hospital day 1 she became febrile, and blood cultures grew enterococcus for which she was started on pip/tazo.  She improved and was discharged on 28-Oct with 14 days of ampicillin.  Since discharge patient reports feeling significantly better, having completed home abx.  She denies fevers, N/V, abdominal pain.    Objective    Vitals:    08/23/18 1326   BP: 122/57   Pulse: 79   Temp: 37.6 ??C (99.7 ??F)   TempSrc: Tympanic   Weight: 49.8 kg (109 lb 11.2 oz)      Body mass index is 22.14 kg/m??.     Physical Exam:    General Appearance:   Pleasant and well-appearing with no distress.  Lungs:                          Normal work of breathing on room air.  Abdomen:                    Soft, non-distended, non-tender to palpation. Scar is well-healed.  Extremities:                 Warm and well-perfused.  ??    Data Review:  Lab Results   Component Value Date    WBC 5.7 08/23/2018    HGB 13.5 08/23/2018    HCT 41.7 08/23/2018    PLT 115 (L) 08/23/2018       Lab Results   Component Value Date    NA 140 08/23/2018    K 3.4 (L) 08/23/2018    CL 101 08/23/2018    CO2 30.0 08/23/2018    BUN 17 08/23/2018    CREATININE 1.07 (H) 08/23/2018    GLU 80 08/23/2018    CALCIUM 9.0 08/23/2018    MG 1.8 08/23/2018    PHOS 4.4 08/23/2018       Lab Results   Component Value Date    BILITOT 0.8 08/23/2018    BILIDIR 0.20 08/23/2018    PROT 6.4 (L) 08/23/2018    ALBUMIN 3.8 08/23/2018    ALT 113 (H) 08/23/2018    AST 43 (H) 08/23/2018    ALKPHOS 72 08/23/2018    GGT 117 (H) 08/23/2018       Lab Results   Component Value Date    LABPROT 16.2 (H) 03/17/2018    INR 0.97 08/03/2018    APTT 25.5 (L) 04/29/2018     Imaging:  No new imaging studies    Leodis Sias, MS3    I attest that I have reviewed the student note and that the components of the history of the present illness, the physical exam, and the assessment and plan documented were performed by me or were performed in my presence by the student where I verified the documentation and performed (or re-performed) the exam and medical decision making. Mearl Harewood S. Celine Mans, MD

## 2018-08-23 NOTE — Unmapped (Signed)
[facility] TRANSPLANT [facility]IC PHARMACY NOTE  08/23/2018   SAGE HAMMILL  [id number]    Medication changes today:   1. Increase prednisone to 30 mg daily     Education/Adherence tools provided today:  1.provided additional education on immunosuppression and transplant related medications including reviewing indications of medications, dosing and side effects     Follow up items:  1. goal of understanding indications and dosing of immunosuppression medications   2. LFTs    Next visit with pharmacy in 1-3 months  ____________________________________________________________________    Unknown Foley is a 62 y.o. 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     Other PMH significant for ascites, esophageal varices, hypothyroidism, RA, HTN  Seen by pharmacy today for: medication management; last seen by pharmacy 6 weeks ago    Post op complicated by RA flare in August 2019 treated with increased dose of prednisone and T12/L2 compression fracture that was medically managed.  She was also diagnosed with C diff 8/9 and was treated with a 10 day course of oral vancomycin.     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  Readmitted 10/21-10/28 for elevated LFTs.  10/22 biopsy was indeterminate for rejection (RAI 2/9). 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  She developed Entercoccus gallinarum/casseliflavus bacteremia 2/2 cholangitis and was initially treated with Zosyn and transitioned to ampicillin for discharge and completed a 14 day course on 08/19/18.    CC: Patient has no complaints today    There were no vitals filed for this visit.    No Known Allergies    All medications reviewed and updated.     Medication list includes revisions made during today???s encounter    Outpatient Encounter Medications as of 08/23/2018   Medication Sig Dispense Refill   ??? acetaminophen (TYLENOL) 325 MG tablet TAKE 2 TABLETS BY MOUTH EVERY 4 HOURS AS NEEDED FOR PAIN. MAX 9 TABLETS (3000 MG) PER DAY. 100 each 2   ??? alendronate (FOSAMAX) 70 MG tablet Take 70 mg by mouth every seven (7) days. Every Monday     ??? [EXPIRED] ampicillin (OMNIPEN) 1 gram injection Infuse 2 g into a venous catheter Every four (4) hours. for 10 days 120 g 0   ??? calcium-vitamin D3-vitamin K (CITRACAL CHEW) 500 mg-1,000 unit-40 mcg Chew Chew 1 tablet daily.     ??? cholecalciferol, vitamin D3, 2,000 unit tablet Take 1 tablet (2,000 Units total) by mouth daily. 90 tablet 3   ??? levothyroxine (SYNTHROID) 75 MCG tablet Take 1 tablet (75 mcg total) by mouth daily. 30 tablet 11   ??? mycophenolate (MYFORTIC) 180 MG EC tablet Take 2 tablets (360 mg total) by mouth Two (2) times a day. 180 tablet 11   ??? omeprazole (PRILOSEC) 40 MG capsule TAKE 1 CAPSULE BY MOUTH TWICE DAILY 60 capsule 0   ??? polyethylene glycol (MIRALAX) 17 gram packet Take 17 g by mouth two (2) times a day as needed (As needed for constipation). Take 1 packet by mouth 2 times every day for constipation.  As needed medicine     ??? predniSONE (DELTASONE) 10 MG tablet Take 3.5 tablets (35 mg total) by mouth daily for 6 days, THEN 3 tablets (30 mg total) daily for 7 days, THEN 2.5 tablets (25 mg total) daily for 7 days, THEN 2 tablets (20 mg total) daily  for 7 days, THEN 1.5 tablets (15 mg total) daily for 7 days, THEN 1 tablet (10 mg total) daily for 16 days. 100 tablet 0   ??? PROGRAF 1 mg capsule Take 6 capsules (6 mg total) by mouth two (2) times a day. 360 capsule 11   ??? sulfamethoxazole-trimethoprim (BACTRIM,SEPTRA) 400-80 mg per tablet TAKE 1 TABLET BY MOUTH 3 TIMES WEEKLY 12 each 5   ??? ursodiol (ACTIGALL) 300 mg capsule Take 1 capsule (300 mg total) by mouth Two (2) times a day. 60 each 11     No facility-administered encounter medications on file as of 08/23/2018.      Induction agent : steroids only    CURRENT IMMUNOSUPPRESSION: tacrolimus 6 mg BID prograf/cyclosporine goal: 6-8   myfortic360  mg PO bid    prednisone 25 mg daily (5 mg taper per week from 40 mg since 07/23/18 for RAI 2/9 on liver biopsy)    Patient is tolerating immunosuppression well aside from occasional insomnia and restless legs she attributes to high dose of prednisone    IMMUNOSUPPRESSION DRUG LEVELS:  Lab Results   Component Value Date    Tacrolimus, Trough 7.1 08/19/2018    Tacrolimus, Trough 7.9 08/16/2018    Tacrolimus, Trough 6.1 08/12/2018     No results found for: CYCLO  No results found for: EVEROLIMUS  No results found for: SIROLIMUS    Prograf level is accurate 12 hour trough     Graft function: worsening   DSA: ntd  Biopsies to date:   07/20/18 - indeterminate for acute cellular rejection, RAI 2/9  08/03/18 - no evidence of ACR RAI 1-2/9  WBC/ANC: wnl  Plan: Increase prednisone back to 30 mg daily and monitor LFTs before making any further adjustmetns.  Increase tacrolimus goal back to 8-10. Will defer transitioning to azathioprine for now given LFT elevation.  TPMT level WNL in 2016    H/o biliary stent placement  Meds currently on: urosdiol 300 mg BID  Plan: continue to montior    ID prophylaxis:   CMV Status: D-/ R+, moderate risk. CMV prophylaxis: valganciclovir 450 mg daily x 3 months per protocol completed   No results found for: CMVCP  PCP Prophylaxis: bactrim SS 1 tab MWF x 6 months (end 10/29/18)  Thrush:  completed in hospital  Patient is  tolerating infectious prophylaxis well.  Plan: Continue to monitor.    RA - defer to local rheumatology for treatment plan. Pt was on golimumab prior to transplant which was held for at least the first 3 months post transplant due to increased infection risk.   Flares were initially being managed by increased prednisone and myfortic + tramadol and tylenol post-transplant.    Received hydrocortisone injection in wrist on 9/3 at Rheum appointment  Plan: defer to rheumatologist for now. May consider resuming Simponi after LFTs normalize and prednisone is on stable dose.    CV Prophylaxis: asa 81 mg was held since 10/10 liver biopsy  The 10-year ASCVD risk score Denman George DC Jr., et al., 2013) is: 3.7%  Statin therapy: Not indicated  Plan: Continue to hold aspirin. Continue to monitor     BP: Goal < 140/90. Clinic vitals reported above  Home BP ranges: not currently checking  Current meds include: none  Plan: Continue to monitor    Anemia:  H/H:   Lab Results   Component Value Date    HGB 13.0 (L) 08/19/2018     Lab Results   Component Value Date  HCT 38.5 08/19/2018     Iron panel:  Lab Results   Component Value Date    IRON 82 12/08/2017    TIBC  12/08/2017      Comment:      Unable to calculate.      FERRITIN 539.0 (H) 12/08/2017     Lab Results   Component Value Date    Iron Saturation (%)  12/08/2017      Comment:      Unable to calculate.       Prior ESA use: none  Plan: Continue to monitor.     DM:   Lab Results   Component Value Date    A1C 5.4 07/28/2018   . Goal A1c < 7  History of Dm? No  Established with endocrinologist/PCP for BG managment? No  Hypoglycemia: no  Plan:  Continue to monitor.    Electrolytes: wnl   Meds currently on: none  Plan: Continue to monitor     GI/BM: Pt reports 1-2BM daily with diarrhea most days after she takes her medications (doesn't appear to be troublesome)  Meds currently on: omeprazole 40 mg BID (has been taking for several years and can tell when she misses a dose)   Plan: Continue to monitor    Pain: pt reports mild back pain that has much improved. Wrist pain from RA flare has resolved with increased prednisone dose  Meds currently on: acetaminophen 650mg  Q8H PRN (uses a few days per week) , tramadol 50 PRN (hasn't used in weeks)  Plan: Patient wants to keep tramadol on profile just in case. Continue to monitor    Bone health:   Vitamin D Level: 26.4 on 07/28/18. Goal > 30.s  Last DEXA results: 07/14/2017:   - The bone mineral density in the spine measuring L1, L3 and L4 measures 0.976 gm/cm2. ??The ??Z score is 0.7 and the T score is -0.7.  - The total bone mineral density in the proximal left femur measures 0.758 gm/cm2. ??The Z score is -0.6 and the T score is -1.5.  Meds currently on: Calcium +D 1 tablet daily, Fosamax 70 mg every Monday per local rheumatologist, cholecalciferol 2,000 units daily  Plan: Recheck Vit D in a couple months     Hypothyroidism  Meds currently on: levothyroxine 75 mcg daily - per patient, recent TSH was ~8 and is managed by PCP although after further discussion with patient free T4 may have been normal  Plan: Defer to PCP for management    Women's/Men's Health:  ANALILIA GEDDIS is a 62 y.o. Female perimenopausal. Patient reports no men's/women's health issues  Plan: Continue to monitor    Adherence: Patient has good understanding of medications; was able to independently identify names/doses of immunosuppressants and OI meds.  Patient  does fill their own pill box on a regular basis at home  Patient brought medication card:yes  Pill box:did not bring  Plan: provided basic adherence counseling/intervention    Spent approximately 30 minutes on educating this patient and greater than 50% was spent in direct face to face counseling regarding post transplant medication education. Questions and concerns were address to patient's satisfaction.    Patient was reviewed with Dr.Desai who was agreement with the stated plan:     During this visit, the following was completed:   BP log data assessment  Labs ordered and evaluated  complex treatment plan >1 DS   Patient education was completed for 11-24 minutes     All questions/concerns were  addressed to the patient's satisfaction.  __________________________________________    PATIENT SEEN AND EVALUATED BY:  [name], PHARMD  [employer] TRANSPLANT  PAGER [phone]

## 2018-08-24 NOTE — Unmapped (Signed)
Communicated with Dr.Lachiewicz re.the need to monitor patient's CMV by high risk protocol when she is mod.risk. She indicated there was no need. However, patient's test results from yesterday indicated she now has a detectable CMV level and given the rise in her lfts, Dr.Lachiewicz recommended she be tested again tomorrow, but deferred any tx at this time. CMV order placed for labs tomorrow.

## 2018-08-25 ENCOUNTER — Ambulatory Visit: Admit: 2018-08-25 | Discharge: 2018-08-26 | Payer: PRIVATE HEALTH INSURANCE

## 2018-08-25 DIAGNOSIS — B259 Cytomegaloviral disease, unspecified: Secondary | ICD-10-CM

## 2018-08-25 DIAGNOSIS — Z5181 Encounter for therapeutic drug level monitoring: Secondary | ICD-10-CM

## 2018-08-25 DIAGNOSIS — E612 Magnesium deficiency: Secondary | ICD-10-CM

## 2018-08-25 DIAGNOSIS — Z944 Liver transplant status: Principal | ICD-10-CM

## 2018-08-25 LAB — COMPREHENSIVE METABOLIC PANEL
ALBUMIN: 3.9 g/dL (ref 3.5–5.0)
ALKALINE PHOSPHATASE: 73 U/L (ref 38–126)
ALT (SGPT): 98 U/L — ABNORMAL HIGH (ref <35)
ANION GAP: 8 mmol/L (ref 7–15)
AST (SGOT): 35 U/L (ref 14–38)
BILIRUBIN TOTAL: 0.6 mg/dL (ref 0.0–1.2)
BLOOD UREA NITROGEN: 20 mg/dL (ref 7–21)
BUN / CREAT RATIO: 21
CALCIUM: 9.3 mg/dL (ref 8.5–10.2)
CHLORIDE: 107 mmol/L (ref 98–107)
CO2: 29 mmol/L (ref 22.0–30.0)
CREATININE: 0.97 mg/dL (ref 0.60–1.00)
EGFR CKD-EPI NON-AA FEMALE: 64 mL/min/{1.73_m2} (ref >=60)
GLUCOSE RANDOM: 91 mg/dL (ref 65–179)
POTASSIUM: 3.6 mmol/L (ref 3.5–5.0)
PROTEIN TOTAL: 6.3 g/dL — ABNORMAL LOW (ref 6.5–8.3)
SODIUM: 144 mmol/L (ref 135–145)

## 2018-08-25 LAB — CBC W/ AUTO DIFF
BASOPHILS ABSOLUTE COUNT: 0.1 10*9/L (ref 0.0–0.1)
BASOPHILS RELATIVE PERCENT: 1.7 %
EOSINOPHILS ABSOLUTE COUNT: 0.1 10*9/L (ref 0.0–0.4)
EOSINOPHILS RELATIVE PERCENT: 1.2 %
HEMATOCRIT: 41.1 % (ref 36.0–46.0)
HEMOGLOBIN: 13.8 g/dL (ref 13.5–16.0)
LYMPHOCYTES ABSOLUTE COUNT: 0.8 10*9/L — ABNORMAL LOW (ref 1.5–5.0)
LYMPHOCYTES RELATIVE PERCENT: 16.2 %
MEAN CORPUSCULAR HEMOGLOBIN CONC: 33.7 g/dL (ref 31.0–37.0)
MEAN CORPUSCULAR HEMOGLOBIN: 32.8 pg (ref 26.0–34.0)
MEAN CORPUSCULAR VOLUME: 97.4 fL (ref 80.0–100.0)
MONOCYTES ABSOLUTE COUNT: 0.5 10*9/L (ref 0.2–0.8)
MONOCYTES RELATIVE PERCENT: 9.4 %
NEUTROPHILS ABSOLUTE COUNT: 3.4 10*9/L (ref 2.0–7.5)
NEUTROPHILS RELATIVE PERCENT: 70.4 %
PLATELET COUNT: 103 10*9/L — ABNORMAL LOW (ref 150–440)
RED BLOOD CELL COUNT: 4.22 10*12/L (ref 4.00–5.20)
RED CELL DISTRIBUTION WIDTH: 14.7 % (ref 12.0–15.0)
WBC ADJUSTED: 4.8 10*9/L (ref 4.5–11.0)

## 2018-08-25 LAB — MAGNESIUM
MAGNESIUM: 1.8 mg/dL (ref 1.6–2.2)
Magnesium:MCnc:Pt:Ser/Plas:Qn:: 1.8

## 2018-08-25 LAB — BILIRUBIN DIRECT: Bilirubin.glucuronidated:MCnc:Pt:Ser/Plas:Qn:: 0.2

## 2018-08-25 LAB — MEAN CORPUSCULAR VOLUME: Lab: 97.4

## 2018-08-25 LAB — GAMMA GLUTAMYL TRANSFERASE: Gamma glutamyl transferase:CCnc:Pt:Ser/Plas:Qn:: 109 — ABNORMAL HIGH

## 2018-08-25 LAB — PHOSPHORUS: Phosphate:MCnc:Pt:Ser/Plas:Qn:: 3.9

## 2018-08-25 LAB — PROTEIN TOTAL: Protein:MCnc:Pt:Ser/Plas:Qn:: 6.3 — ABNORMAL LOW

## 2018-08-25 NOTE — Unmapped (Signed)
Patient seen in clinic today, accompanied by her husband, for her post-hospitalization f/u. She looks well and reports she feels well. She has had no recent illnesses, started any new medications or supplements. She denies n/v/constipation, fever/chills, SOB or pain. She reports she has a daily loose stool after her am meds, but no repeated incidences. She denies issues around her incision and no c/o hernias. She reports she has not experienced any new sRA flare, although she continues to have left wrist and back pain. She is drinking at least 2 liters of fluid daily. She questions when she can begin taking her 81 mg asa and is aware her lfts have increased again. She denies missing any doses of her IS, and has started weaning her prednisone dose to 25mg  as of 11/7. Patient seen by pharmacist and Dr.Desai, who both felton wants to increase her tac go given her uptrending lfts her tac goal should be increased to 8-10 and her prednisone dose upped back to 30mg . Dr.Desai directed her to repeat labs on Wed and if they continue to increase, she will need to be admitted for obs for w/u. Patient directed to continue to refrain from taking ASA since she may need a bx this week. Spent about 10 minutes discussing plan of care with patient/spouse. They verbalized understanding.

## 2018-08-25 NOTE — Unmapped (Addendum)
Patient's repeat labs today demonstrate some improvement in lfts. Reviewed with Dr.Desai, who said we should continue to wait and watch, recommending patient repeat labs again on Friday. Spoke to patient and reviewed labs and physician recommendations. She agreed to repeat labs on Friday.

## 2018-08-25 NOTE — Unmapped (Signed)
error 

## 2018-08-26 LAB — CMV DNA, QUANTITATIVE, PCR: CMV VIRAL LD: NOT DETECTED

## 2018-08-26 LAB — TACROLIMUS, TROUGH: Lab: 7.6

## 2018-08-26 LAB — CMV QUANT: Lab: 0

## 2018-08-27 ENCOUNTER — Ambulatory Visit: Admit: 2018-08-27 | Discharge: 2018-08-28 | Payer: PRIVATE HEALTH INSURANCE

## 2018-08-27 DIAGNOSIS — E612 Magnesium deficiency: Secondary | ICD-10-CM

## 2018-08-27 DIAGNOSIS — Z5181 Encounter for therapeutic drug level monitoring: Secondary | ICD-10-CM

## 2018-08-27 DIAGNOSIS — B259 Cytomegaloviral disease, unspecified: Secondary | ICD-10-CM

## 2018-08-27 DIAGNOSIS — Z944 Liver transplant status: Principal | ICD-10-CM

## 2018-08-27 LAB — CALCIUM: Calcium:MCnc:Pt:Ser/Plas:Qn:: 9.1

## 2018-08-27 LAB — BILIRUBIN DIRECT: Bilirubin.glucuronidated:MCnc:Pt:Ser/Plas:Qn:: 0.1

## 2018-08-27 LAB — CBC W/ AUTO DIFF
BASOPHILS ABSOLUTE COUNT: 0.1 10*9/L (ref 0.0–0.1)
BASOPHILS RELATIVE PERCENT: 2.6 %
EOSINOPHILS ABSOLUTE COUNT: 0.1 10*9/L (ref 0.0–0.4)
EOSINOPHILS RELATIVE PERCENT: 2.3 %
HEMATOCRIT: 41.6 % (ref 36.0–46.0)
HEMOGLOBIN: 13.8 g/dL (ref 13.5–16.0)
LARGE UNSTAINED CELLS: 2 % (ref 0–4)
LYMPHOCYTES ABSOLUTE COUNT: 0.8 10*9/L — ABNORMAL LOW (ref 1.5–5.0)
LYMPHOCYTES RELATIVE PERCENT: 18.9 %
MEAN CORPUSCULAR HEMOGLOBIN CONC: 33.2 g/dL (ref 31.0–37.0)
MEAN CORPUSCULAR HEMOGLOBIN: 32.1 pg (ref 26.0–34.0)
MEAN CORPUSCULAR VOLUME: 96.7 fL (ref 80.0–100.0)
MEAN PLATELET VOLUME: 7.7 fL (ref 7.0–10.0)
MONOCYTES ABSOLUTE COUNT: 0.7 10*9/L (ref 0.2–0.8)
MONOCYTES RELATIVE PERCENT: 16.3 %
NEUTROPHILS RELATIVE PERCENT: 58.4 %
RED BLOOD CELL COUNT: 4.3 10*12/L (ref 4.00–5.20)
WBC ADJUSTED: 4.5 10*9/L (ref 4.5–11.0)

## 2018-08-27 LAB — COMPREHENSIVE METABOLIC PANEL
ALBUMIN: 3.8 g/dL (ref 3.5–5.0)
ALKALINE PHOSPHATASE: 67 U/L (ref 38–126)
ALT (SGPT): 100 U/L — ABNORMAL HIGH (ref <35)
ANION GAP: 6 mmol/L — ABNORMAL LOW (ref 7–15)
AST (SGOT): 40 U/L — ABNORMAL HIGH (ref 14–38)
BILIRUBIN TOTAL: 0.7 mg/dL (ref 0.0–1.2)
BLOOD UREA NITROGEN: 20 mg/dL (ref 7–21)
CALCIUM: 9.1 mg/dL (ref 8.5–10.2)
CHLORIDE: 108 mmol/L — ABNORMAL HIGH (ref 98–107)
CO2: 29 mmol/L (ref 22.0–30.0)
CREATININE: 1.11 mg/dL — ABNORMAL HIGH (ref 0.60–1.00)
EGFR CKD-EPI AA FEMALE: 62 mL/min/{1.73_m2} (ref >=60)
EGFR CKD-EPI NON-AA FEMALE: 54 mL/min/{1.73_m2} — ABNORMAL LOW (ref >=60)
GLUCOSE RANDOM: 88 mg/dL (ref 65–179)
POTASSIUM: 3.8 mmol/L (ref 3.5–5.0)
PROTEIN TOTAL: 6.1 g/dL — ABNORMAL LOW (ref 6.5–8.3)
SODIUM: 143 mmol/L (ref 135–145)

## 2018-08-27 LAB — MAGNESIUM: Magnesium:MCnc:Pt:Ser/Plas:Qn:: 1.7

## 2018-08-27 LAB — GAMMA GLUTAMYL TRANSFERASE: Gamma glutamyl transferase:CCnc:Pt:Ser/Plas:Qn:: 101 — ABNORMAL HIGH

## 2018-08-27 LAB — MEAN CORPUSCULAR HEMOGLOBIN CONC: Lab: 33.2

## 2018-08-27 LAB — PHOSPHORUS: Phosphate:MCnc:Pt:Ser/Plas:Qn:: 4.3

## 2018-08-28 LAB — TACROLIMUS, TROUGH: Lab: 6.9

## 2018-08-30 ENCOUNTER — Ambulatory Visit: Admit: 2018-08-30 | Discharge: 2018-08-31 | Payer: PRIVATE HEALTH INSURANCE

## 2018-08-30 DIAGNOSIS — Z5181 Encounter for therapeutic drug level monitoring: Secondary | ICD-10-CM

## 2018-08-30 DIAGNOSIS — E612 Magnesium deficiency: Secondary | ICD-10-CM

## 2018-08-30 DIAGNOSIS — B259 Cytomegaloviral disease, unspecified: Secondary | ICD-10-CM

## 2018-08-30 DIAGNOSIS — Z944 Liver transplant status: Principal | ICD-10-CM

## 2018-08-30 LAB — COMPREHENSIVE METABOLIC PANEL
ALBUMIN: 3.8 g/dL (ref 3.5–5.0)
ALT (SGPT): 82 U/L — ABNORMAL HIGH (ref <35)
ANION GAP: 8 mmol/L (ref 7–15)
BILIRUBIN TOTAL: 0.8 mg/dL (ref 0.0–1.2)
BLOOD UREA NITROGEN: 22 mg/dL — ABNORMAL HIGH (ref 7–21)
BUN / CREAT RATIO: 22
CALCIUM: 9.3 mg/dL (ref 8.5–10.2)
CHLORIDE: 106 mmol/L (ref 98–107)
CO2: 28 mmol/L (ref 22.0–30.0)
CREATININE: 0.98 mg/dL (ref 0.60–1.00)
EGFR CKD-EPI AA FEMALE: 73 mL/min/{1.73_m2} (ref >=60)
EGFR CKD-EPI NON-AA FEMALE: 63 mL/min/{1.73_m2} (ref >=60)
GLUCOSE RANDOM: 88 mg/dL (ref 65–179)
POTASSIUM: 3.8 mmol/L (ref 3.5–5.0)
PROTEIN TOTAL: 6.3 g/dL — ABNORMAL LOW (ref 6.5–8.3)
SODIUM: 142 mmol/L (ref 135–145)

## 2018-08-30 LAB — CBC W/ AUTO DIFF
BASOPHILS ABSOLUTE COUNT: 0.1 10*9/L (ref 0.0–0.1)
BASOPHILS RELATIVE PERCENT: 2.1 %
EOSINOPHILS ABSOLUTE COUNT: 0.1 10*9/L (ref 0.0–0.4)
EOSINOPHILS RELATIVE PERCENT: 1.3 %
HEMOGLOBIN: 14 g/dL (ref 13.5–16.0)
LARGE UNSTAINED CELLS: 2 % (ref 0–4)
LYMPHOCYTES ABSOLUTE COUNT: 0.9 10*9/L — ABNORMAL LOW (ref 1.5–5.0)
LYMPHOCYTES RELATIVE PERCENT: 16.9 %
MEAN CORPUSCULAR HEMOGLOBIN CONC: 33.5 g/dL (ref 31.0–37.0)
MEAN CORPUSCULAR HEMOGLOBIN: 32.2 pg (ref 26.0–34.0)
MEAN CORPUSCULAR VOLUME: 95.9 fL (ref 80.0–100.0)
MEAN PLATELET VOLUME: 7.5 fL (ref 7.0–10.0)
MONOCYTES ABSOLUTE COUNT: 0.5 10*9/L (ref 0.2–0.8)
NEUTROPHILS ABSOLUTE COUNT: 3.5 10*9/L (ref 2.0–7.5)
NEUTROPHILS RELATIVE PERCENT: 68 %
PLATELET COUNT: 106 10*9/L — ABNORMAL LOW (ref 150–440)
RED BLOOD CELL COUNT: 4.35 10*12/L (ref 4.00–5.20)
RED CELL DISTRIBUTION WIDTH: 14.6 % (ref 12.0–15.0)
WBC ADJUSTED: 5.2 10*9/L (ref 4.5–11.0)

## 2018-08-30 LAB — BUN / CREAT RATIO: Urea nitrogen/Creatinine:MRto:Pt:Ser/Plas:Qn:: 22

## 2018-08-30 LAB — MAGNESIUM: Magnesium:MCnc:Pt:Ser/Plas:Qn:: 1.8

## 2018-08-30 LAB — MEAN CORPUSCULAR VOLUME: Lab: 95.9

## 2018-08-30 LAB — PHOSPHORUS: Phosphate:MCnc:Pt:Ser/Plas:Qn:: 4.5

## 2018-08-30 LAB — GAMMA GLUTAMYL TRANSFERASE: Gamma glutamyl transferase:CCnc:Pt:Ser/Plas:Qn:: 97 — ABNORMAL HIGH

## 2018-08-30 LAB — BILIRUBIN, DIRECT: BILIRUBIN DIRECT: 0.2 mg/dL (ref 0.00–0.40)

## 2018-08-30 LAB — TACROLIMUS, TROUGH: Lab: 6.5

## 2018-08-30 LAB — BILIRUBIN DIRECT: Bilirubin.glucuronidated:MCnc:Pt:Ser/Plas:Qn:: 0.2

## 2018-08-30 NOTE — Unmapped (Signed)
Patient's 11/13 CMV level not detectable after 11/11 detectable level. Per Dr.Lachiewicz, no need to continue monitoring.

## 2018-08-30 NOTE — Unmapped (Signed)
Patient's 11/15 labs with no improvement in lfts. Reviewed with Dr.Serrano, who recommended continuing to monitor.

## 2018-09-01 ENCOUNTER — Ambulatory Visit: Admit: 2018-09-01 | Discharge: 2018-09-02 | Payer: PRIVATE HEALTH INSURANCE

## 2018-09-01 DIAGNOSIS — B259 Cytomegaloviral disease, unspecified: Secondary | ICD-10-CM

## 2018-09-01 DIAGNOSIS — Z5181 Encounter for therapeutic drug level monitoring: Secondary | ICD-10-CM

## 2018-09-01 DIAGNOSIS — E612 Magnesium deficiency: Secondary | ICD-10-CM

## 2018-09-01 DIAGNOSIS — Z944 Liver transplant status: Principal | ICD-10-CM

## 2018-09-01 LAB — COMPREHENSIVE METABOLIC PANEL
ALBUMIN: 3.7 g/dL (ref 3.5–5.0)
ALKALINE PHOSPHATASE: 74 U/L (ref 38–126)
ALT (SGPT): 69 U/L — ABNORMAL HIGH (ref <35)
ANION GAP: 9 mmol/L (ref 7–15)
AST (SGOT): 28 U/L (ref 14–38)
BILIRUBIN TOTAL: 0.8 mg/dL (ref 0.0–1.2)
BLOOD UREA NITROGEN: 26 mg/dL — ABNORMAL HIGH (ref 7–21)
BUN / CREAT RATIO: 25
CALCIUM: 9.2 mg/dL (ref 8.5–10.2)
CHLORIDE: 108 mmol/L — ABNORMAL HIGH (ref 98–107)
CO2: 26 mmol/L (ref 22.0–30.0)
CREATININE: 1.05 mg/dL — ABNORMAL HIGH (ref 0.60–1.00)
EGFR CKD-EPI NON-AA FEMALE: 58 mL/min/{1.73_m2} — ABNORMAL LOW (ref >=60)
GLUCOSE RANDOM: 82 mg/dL (ref 65–179)
PROTEIN TOTAL: 6.1 g/dL — ABNORMAL LOW (ref 6.5–8.3)
SODIUM: 143 mmol/L (ref 135–145)

## 2018-09-01 LAB — PHOSPHORUS: Phosphate:MCnc:Pt:Ser/Plas:Qn:: 4.6

## 2018-09-01 LAB — CMV VIRAL LD: Lab: NOT DETECTED

## 2018-09-01 LAB — CALCIUM: Calcium:MCnc:Pt:Ser/Plas:Qn:: 9.2

## 2018-09-01 LAB — CMV DNA, QUANTITATIVE, PCR: CMV VIRAL LD: NOT DETECTED

## 2018-09-01 LAB — CBC W/ AUTO DIFF
BASOPHILS ABSOLUTE COUNT: 0.1 10*9/L (ref 0.0–0.1)
EOSINOPHILS ABSOLUTE COUNT: 0.1 10*9/L (ref 0.0–0.4)
EOSINOPHILS RELATIVE PERCENT: 1.7 %
HEMATOCRIT: 41.3 % (ref 36.0–46.0)
HEMOGLOBIN: 13.8 g/dL (ref 13.5–16.0)
LARGE UNSTAINED CELLS: 1 % (ref 0–4)
LYMPHOCYTES RELATIVE PERCENT: 16.5 %
MEAN CORPUSCULAR HEMOGLOBIN CONC: 33.5 g/dL (ref 31.0–37.0)
MEAN CORPUSCULAR HEMOGLOBIN: 32.3 pg (ref 26.0–34.0)
MEAN CORPUSCULAR VOLUME: 96.3 fL (ref 80.0–100.0)
MONOCYTES ABSOLUTE COUNT: 0.6 10*9/L (ref 0.2–0.8)
MONOCYTES RELATIVE PERCENT: 9.2 %
NEUTROPHILS ABSOLUTE COUNT: 4.5 10*9/L (ref 2.0–7.5)
NEUTROPHILS RELATIVE PERCENT: 69.6 %
PLATELET COUNT: 108 10*9/L — ABNORMAL LOW (ref 150–440)
RED BLOOD CELL COUNT: 4.29 10*12/L (ref 4.00–5.20)
WBC ADJUSTED: 6.4 10*9/L (ref 4.5–11.0)

## 2018-09-01 LAB — BILIRUBIN DIRECT: Bilirubin.glucuronidated:MCnc:Pt:Ser/Plas:Qn:: 0.2

## 2018-09-01 LAB — NEUTROPHILS RELATIVE PERCENT: Lab: 69.6

## 2018-09-01 LAB — MAGNESIUM: Magnesium:MCnc:Pt:Ser/Plas:Qn:: 1.9

## 2018-09-01 LAB — GAMMA GLUTAMYL TRANSFERASE: Gamma glutamyl transferase:CCnc:Pt:Ser/Plas:Qn:: 85 — ABNORMAL HIGH

## 2018-09-02 LAB — TACROLIMUS, TROUGH: Lab: 7.2

## 2018-09-03 ENCOUNTER — Ambulatory Visit: Admit: 2018-09-03 | Discharge: 2018-09-04 | Payer: PRIVATE HEALTH INSURANCE

## 2018-09-03 DIAGNOSIS — E612 Magnesium deficiency: Secondary | ICD-10-CM

## 2018-09-03 DIAGNOSIS — Z944 Liver transplant status: Principal | ICD-10-CM

## 2018-09-03 DIAGNOSIS — Z5181 Encounter for therapeutic drug level monitoring: Secondary | ICD-10-CM

## 2018-09-03 LAB — COMPREHENSIVE METABOLIC PANEL
ALBUMIN: 3.7 g/dL (ref 3.5–5.0)
ALKALINE PHOSPHATASE: 72 U/L (ref 38–126)
ALT (SGPT): 58 U/L — ABNORMAL HIGH (ref <35)
ANION GAP: 3 mmol/L — ABNORMAL LOW (ref 7–15)
AST (SGOT): 25 U/L (ref 14–38)
BILIRUBIN TOTAL: 0.7 mg/dL (ref 0.0–1.2)
BLOOD UREA NITROGEN: 28 mg/dL — ABNORMAL HIGH (ref 7–21)
BUN / CREAT RATIO: 27
CHLORIDE: 108 mmol/L — ABNORMAL HIGH (ref 98–107)
CO2: 27 mmol/L (ref 22.0–30.0)
CREATININE: 1.04 mg/dL — ABNORMAL HIGH (ref 0.60–1.00)
EGFR CKD-EPI AA FEMALE: 67 mL/min/{1.73_m2} (ref >=60)
EGFR CKD-EPI NON-AA FEMALE: 59 mL/min/{1.73_m2} — ABNORMAL LOW (ref >=60)
GLUCOSE RANDOM: 90 mg/dL (ref 65–179)
POTASSIUM: 4 mmol/L (ref 3.5–5.0)
PROTEIN TOTAL: 6.1 g/dL — ABNORMAL LOW (ref 6.5–8.3)

## 2018-09-03 LAB — BILIRUBIN DIRECT: Bilirubin.glucuronidated:MCnc:Pt:Ser/Plas:Qn:: 0.1

## 2018-09-03 LAB — CBC W/ AUTO DIFF
BASOPHILS RELATIVE PERCENT: 1.5 %
EOSINOPHILS ABSOLUTE COUNT: 0.1 10*9/L (ref 0.0–0.4)
EOSINOPHILS RELATIVE PERCENT: 2.2 %
HEMATOCRIT: 40.7 % (ref 36.0–46.0)
LARGE UNSTAINED CELLS: 1 % (ref 0–4)
LYMPHOCYTES ABSOLUTE COUNT: 1.1 10*9/L — ABNORMAL LOW (ref 1.5–5.0)
LYMPHOCYTES RELATIVE PERCENT: 18.3 %
MEAN CORPUSCULAR HEMOGLOBIN CONC: 33.6 g/dL (ref 31.0–37.0)
MEAN CORPUSCULAR HEMOGLOBIN: 32.3 pg (ref 26.0–34.0)
MEAN CORPUSCULAR VOLUME: 96.1 fL (ref 80.0–100.0)
MEAN PLATELET VOLUME: 7.6 fL (ref 7.0–10.0)
MONOCYTES ABSOLUTE COUNT: 0.4 10*9/L (ref 0.2–0.8)
MONOCYTES RELATIVE PERCENT: 6.4 %
NEUTROPHILS ABSOLUTE COUNT: 4.3 10*9/L (ref 2.0–7.5)
NEUTROPHILS RELATIVE PERCENT: 70.3 %
PLATELET COUNT: 112 10*9/L — ABNORMAL LOW (ref 150–440)
RED BLOOD CELL COUNT: 4.23 10*12/L (ref 4.00–5.20)
WBC ADJUSTED: 6.1 10*9/L (ref 4.5–11.0)

## 2018-09-03 LAB — MAGNESIUM: Magnesium:MCnc:Pt:Ser/Plas:Qn:: 1.9

## 2018-09-03 LAB — EOSINOPHILS RELATIVE PERCENT: Lab: 2.2

## 2018-09-03 LAB — GAMMA GLUTAMYL TRANSFERASE: Gamma glutamyl transferase:CCnc:Pt:Ser/Plas:Qn:: 79 — ABNORMAL HIGH

## 2018-09-03 LAB — PHOSPHORUS: Phosphate:MCnc:Pt:Ser/Plas:Qn:: 4.6

## 2018-09-03 LAB — GLUCOSE RANDOM: Glucose:MCnc:Pt:Ser/Plas:Qn:: 90

## 2018-09-03 NOTE — Unmapped (Signed)
Told by txp pharmacist, Dr. Farrel Gobble last week to reevaluate her steroid dose after it was increased back to 30mg . Reviewed labs with her this week and she recommended monitoring for another week and if her lfts downtrended throughout the next week, her dose could be reduced to 25mg . She also approved of twice weekly labs.Left VM for patient relaying all recommendations and encouraging her to return call with receipt of msg.

## 2018-09-04 LAB — TACROLIMUS, TROUGH: Lab: 5.9

## 2018-09-06 ENCOUNTER — Ambulatory Visit: Admit: 2018-09-06 | Discharge: 2018-09-07 | Payer: PRIVATE HEALTH INSURANCE

## 2018-09-06 DIAGNOSIS — Z944 Liver transplant status: Principal | ICD-10-CM

## 2018-09-06 DIAGNOSIS — Z5181 Encounter for therapeutic drug level monitoring: Secondary | ICD-10-CM

## 2018-09-06 DIAGNOSIS — E612 Magnesium deficiency: Secondary | ICD-10-CM

## 2018-09-06 LAB — CBC W/ AUTO DIFF
BASOPHILS ABSOLUTE COUNT: 0 10*9/L (ref 0.0–0.1)
BASOPHILS RELATIVE PERCENT: 0.6 %
EOSINOPHILS ABSOLUTE COUNT: 0.1 10*9/L (ref 0.0–0.4)
HEMATOCRIT: 41.7 % (ref 36.0–46.0)
HEMOGLOBIN: 13.7 g/dL (ref 13.5–16.0)
LARGE UNSTAINED CELLS: 1 % (ref 0–4)
LYMPHOCYTES ABSOLUTE COUNT: 1.1 10*9/L — ABNORMAL LOW (ref 1.5–5.0)
LYMPHOCYTES RELATIVE PERCENT: 19.2 %
MEAN CORPUSCULAR HEMOGLOBIN CONC: 32.9 g/dL (ref 31.0–37.0)
MEAN CORPUSCULAR HEMOGLOBIN: 31.6 pg (ref 26.0–34.0)
MEAN CORPUSCULAR VOLUME: 96.3 fL (ref 80.0–100.0)
MEAN PLATELET VOLUME: 7.2 fL (ref 7.0–10.0)
MONOCYTES ABSOLUTE COUNT: 0.4 10*9/L (ref 0.2–0.8)
MONOCYTES RELATIVE PERCENT: 7.4 %
NEUTROPHILS ABSOLUTE COUNT: 3.9 10*9/L (ref 2.0–7.5)
NEUTROPHILS RELATIVE PERCENT: 70 %
PLATELET COUNT: 114 10*9/L — ABNORMAL LOW (ref 150–440)
RED BLOOD CELL COUNT: 4.33 10*12/L (ref 4.00–5.20)
RED CELL DISTRIBUTION WIDTH: 14.4 % (ref 12.0–15.0)
WBC ADJUSTED: 5.5 10*9/L (ref 4.5–11.0)

## 2018-09-06 LAB — TACROLIMUS, TROUGH: Lab: 6

## 2018-09-06 LAB — COMPREHENSIVE METABOLIC PANEL
ALKALINE PHOSPHATASE: 68 U/L (ref 38–126)
ALT (SGPT): 48 U/L — ABNORMAL HIGH (ref <35)
ANION GAP: 5 mmol/L — ABNORMAL LOW (ref 7–15)
AST (SGOT): 23 U/L (ref 14–38)
BILIRUBIN TOTAL: 0.6 mg/dL (ref 0.0–1.2)
BLOOD UREA NITROGEN: 23 mg/dL — ABNORMAL HIGH (ref 7–21)
BUN / CREAT RATIO: 21
CALCIUM: 9.4 mg/dL (ref 8.5–10.2)
CHLORIDE: 109 mmol/L — ABNORMAL HIGH (ref 98–107)
CO2: 28 mmol/L (ref 22.0–30.0)
CREATININE: 1.08 mg/dL — ABNORMAL HIGH (ref 0.60–1.00)
EGFR CKD-EPI AA FEMALE: 64 mL/min/{1.73_m2} (ref >=60)
EGFR CKD-EPI NON-AA FEMALE: 56 mL/min/{1.73_m2} — ABNORMAL LOW (ref >=60)
GLUCOSE RANDOM: 89 mg/dL (ref 65–179)
POTASSIUM: 4.7 mmol/L (ref 3.5–5.0)
PROTEIN TOTAL: 6.1 g/dL — ABNORMAL LOW (ref 6.5–8.3)
SODIUM: 142 mmol/L (ref 135–145)

## 2018-09-06 LAB — BILIRUBIN DIRECT: Bilirubin.glucuronidated:MCnc:Pt:Ser/Plas:Qn:: 0.1

## 2018-09-06 LAB — MEAN PLATELET VOLUME: Lab: 7.2

## 2018-09-06 LAB — BLOOD UREA NITROGEN: Urea nitrogen:MCnc:Pt:Ser/Plas:Qn:: 23 — ABNORMAL HIGH

## 2018-09-06 LAB — PHOSPHORUS: Phosphate:MCnc:Pt:Ser/Plas:Qn:: 4.8 — ABNORMAL HIGH

## 2018-09-06 LAB — GAMMA GLUTAMYL TRANSFERASE: Gamma glutamyl transferase:CCnc:Pt:Ser/Plas:Qn:: 69 — ABNORMAL HIGH

## 2018-09-06 LAB — MAGNESIUM
MAGNESIUM: 1.7 mg/dL (ref 1.6–2.2)
Magnesium:MCnc:Pt:Ser/Plas:Qn:: 1.7

## 2018-09-07 MED ORDER — PREDNISONE 10 MG TABLET
ORAL_TABLET | Freq: Every day | ORAL | 1 refills | 0.00000 days | Status: CP
Start: 2018-09-07 — End: 2018-09-21

## 2018-09-07 NOTE — Unmapped (Signed)
Contacted by txp pharmacist, Dr.Mincemoyer, who reviewed patient's labs from this week and recommended her prednisone be reduced to 25mg  daily. Spoke to patient and instructed her to change her prednisone dose and complete labs twice weekly. She verbalized understanding.

## 2018-09-08 ENCOUNTER — Ambulatory Visit: Admit: 2018-09-08 | Discharge: 2018-09-09 | Payer: PRIVATE HEALTH INSURANCE

## 2018-09-08 DIAGNOSIS — Z5181 Encounter for therapeutic drug level monitoring: Secondary | ICD-10-CM

## 2018-09-08 DIAGNOSIS — Z944 Liver transplant status: Principal | ICD-10-CM

## 2018-09-08 DIAGNOSIS — E612 Magnesium deficiency: Secondary | ICD-10-CM

## 2018-09-08 LAB — CBC W/ AUTO DIFF
BASOPHILS ABSOLUTE COUNT: 0.1 10*9/L (ref 0.0–0.1)
BASOPHILS RELATIVE PERCENT: 1.3 %
EOSINOPHILS ABSOLUTE COUNT: 0.1 10*9/L (ref 0.0–0.4)
EOSINOPHILS RELATIVE PERCENT: 1.9 %
HEMATOCRIT: 40.9 % (ref 36.0–46.0)
LARGE UNSTAINED CELLS: 1 % (ref 0–4)
LYMPHOCYTES ABSOLUTE COUNT: 1.1 10*9/L — ABNORMAL LOW (ref 1.5–5.0)
MEAN CORPUSCULAR HEMOGLOBIN CONC: 33.2 g/dL (ref 31.0–37.0)
MEAN CORPUSCULAR HEMOGLOBIN: 31.5 pg (ref 26.0–34.0)
MEAN CORPUSCULAR VOLUME: 95 fL (ref 80.0–100.0)
MONOCYTES RELATIVE PERCENT: 6 %
NEUTROPHILS ABSOLUTE COUNT: 4.2 10*9/L (ref 2.0–7.5)
NEUTROPHILS RELATIVE PERCENT: 71.7 %
PLATELET COUNT: 124 10*9/L — ABNORMAL LOW (ref 150–440)
RED BLOOD CELL COUNT: 4.3 10*12/L (ref 4.00–5.20)
RED CELL DISTRIBUTION WIDTH: 14.3 % (ref 12.0–15.0)
WBC ADJUSTED: 5.9 10*9/L (ref 4.5–11.0)

## 2018-09-08 LAB — COMPREHENSIVE METABOLIC PANEL
ALBUMIN: 3.7 g/dL (ref 3.5–5.0)
ALKALINE PHOSPHATASE: 68 U/L (ref 38–126)
ALT (SGPT): 45 U/L — ABNORMAL HIGH (ref <35)
ANION GAP: 4 mmol/L — ABNORMAL LOW (ref 7–15)
AST (SGOT): 20 U/L (ref 14–38)
BILIRUBIN TOTAL: 0.6 mg/dL (ref 0.0–1.2)
BLOOD UREA NITROGEN: 22 mg/dL — ABNORMAL HIGH (ref 7–21)
BUN / CREAT RATIO: 22
CALCIUM: 9.1 mg/dL (ref 8.5–10.2)
CHLORIDE: 107 mmol/L (ref 98–107)
CREATININE: 1 mg/dL (ref 0.60–1.00)
EGFR CKD-EPI AA FEMALE: 71 mL/min/{1.73_m2} (ref >=60)
EGFR CKD-EPI NON-AA FEMALE: 61 mL/min/{1.73_m2} (ref >=60)
GLUCOSE RANDOM: 92 mg/dL (ref 65–179)
POTASSIUM: 3.9 mmol/L (ref 3.5–5.0)
PROTEIN TOTAL: 6.1 g/dL — ABNORMAL LOW (ref 6.5–8.3)
SODIUM: 139 mmol/L (ref 135–145)

## 2018-09-08 LAB — LARGE UNSTAINED CELLS: Lab: 1

## 2018-09-08 LAB — PHOSPHORUS: Phosphate:MCnc:Pt:Ser/Plas:Qn:: 4.2

## 2018-09-08 LAB — GAMMA GLUTAMYL TRANSFERASE: Gamma glutamyl transferase:CCnc:Pt:Ser/Plas:Qn:: 62 — ABNORMAL HIGH

## 2018-09-08 LAB — GLUCOSE RANDOM: Glucose:MCnc:Pt:Ser/Plas:Qn:: 92

## 2018-09-08 LAB — BILIRUBIN DIRECT: Bilirubin.glucuronidated:MCnc:Pt:Ser/Plas:Qn:: 0.1

## 2018-09-08 LAB — MAGNESIUM: Magnesium:MCnc:Pt:Ser/Plas:Qn:: 1.8

## 2018-09-08 NOTE — Unmapped (Signed)
11/27: PT gets Prograf, Myfortic and Prednisone from [facility]. Still wants to be enrolled and receive calls for maint. meds.

## 2018-09-09 LAB — TACROLIMUS, TROUGH: Lab: 7.5

## 2018-09-13 ENCOUNTER — Ambulatory Visit: Admit: 2018-09-13 | Discharge: 2018-09-14 | Payer: PRIVATE HEALTH INSURANCE

## 2018-09-13 DIAGNOSIS — Z944 Liver transplant status: Principal | ICD-10-CM

## 2018-09-13 DIAGNOSIS — E612 Magnesium deficiency: Secondary | ICD-10-CM

## 2018-09-13 DIAGNOSIS — Z5181 Encounter for therapeutic drug level monitoring: Secondary | ICD-10-CM

## 2018-09-13 LAB — COMPREHENSIVE METABOLIC PANEL
ALBUMIN: 3.6 g/dL (ref 3.5–5.0)
ALKALINE PHOSPHATASE: 72 U/L (ref 38–126)
ALT (SGPT): 39 U/L — ABNORMAL HIGH (ref <35)
ANION GAP: 5 mmol/L — ABNORMAL LOW (ref 7–15)
AST (SGOT): 22 U/L (ref 14–38)
BILIRUBIN TOTAL: 0.7 mg/dL (ref 0.0–1.2)
BLOOD UREA NITROGEN: 23 mg/dL — ABNORMAL HIGH (ref 7–21)
BUN / CREAT RATIO: 22
CALCIUM: 9 mg/dL (ref 8.5–10.2)
CHLORIDE: 107 mmol/L (ref 98–107)
CO2: 26 mmol/L (ref 22.0–30.0)
CREATININE: 1.05 mg/dL — ABNORMAL HIGH (ref 0.60–1.00)
EGFR CKD-EPI NON-AA FEMALE: 58 mL/min/{1.73_m2} — ABNORMAL LOW (ref >=60)
GLUCOSE RANDOM: 100 mg/dL (ref 65–179)
POTASSIUM: 3.5 mmol/L (ref 3.5–5.0)
PROTEIN TOTAL: 6 g/dL — ABNORMAL LOW (ref 6.5–8.3)

## 2018-09-13 LAB — CBC W/ AUTO DIFF
BASOPHILS ABSOLUTE COUNT: 0 10*9/L (ref 0.0–0.1)
BASOPHILS RELATIVE PERCENT: 0.5 %
EOSINOPHILS ABSOLUTE COUNT: 0.1 10*9/L (ref 0.0–0.4)
EOSINOPHILS RELATIVE PERCENT: 1.1 %
HEMATOCRIT: 39.9 % (ref 36.0–46.0)
HEMOGLOBIN: 13.3 g/dL — ABNORMAL LOW (ref 13.5–16.0)
LARGE UNSTAINED CELLS: 1 % (ref 0–4)
LYMPHOCYTES ABSOLUTE COUNT: 0.9 10*9/L — ABNORMAL LOW (ref 1.5–5.0)
LYMPHOCYTES RELATIVE PERCENT: 15.3 %
MEAN CORPUSCULAR HEMOGLOBIN: 31.6 pg (ref 26.0–34.0)
MEAN CORPUSCULAR VOLUME: 94.6 fL (ref 80.0–100.0)
MEAN PLATELET VOLUME: 7.1 fL (ref 7.0–10.0)
MONOCYTES ABSOLUTE COUNT: 0.4 10*9/L (ref 0.2–0.8)
MONOCYTES RELATIVE PERCENT: 5.8 %
NEUTROPHILS ABSOLUTE COUNT: 4.7 10*9/L (ref 2.0–7.5)
RED CELL DISTRIBUTION WIDTH: 14.1 % (ref 12.0–15.0)
WBC ADJUSTED: 6.2 10*9/L (ref 4.5–11.0)

## 2018-09-13 LAB — MAGNESIUM: Magnesium:MCnc:Pt:Ser/Plas:Qn:: 1.8

## 2018-09-13 LAB — POTASSIUM: Potassium:SCnc:Pt:Ser/Plas:Qn:: 3.5

## 2018-09-13 LAB — GAMMA GLUTAMYL TRANSFERASE: Gamma glutamyl transferase:CCnc:Pt:Ser/Plas:Qn:: 55 — ABNORMAL HIGH

## 2018-09-13 LAB — BILIRUBIN DIRECT: Bilirubin.glucuronidated:MCnc:Pt:Ser/Plas:Qn:: 0.1

## 2018-09-13 LAB — PHOSPHORUS: Phosphate:MCnc:Pt:Ser/Plas:Qn:: 4.2

## 2018-09-13 LAB — TACROLIMUS, TROUGH: Lab: 6.2

## 2018-09-13 LAB — BASOPHILS ABSOLUTE COUNT: Lab: 0

## 2018-09-14 MED FILL — ACETAMINOPHEN 325 MG TABLET: ORAL | 30 days supply | Qty: 100 | Fill #1

## 2018-09-14 MED FILL — URSODIOL 300 MG CAPSULE: 30 days supply | Qty: 60 | Fill #4 | Status: AC

## 2018-09-14 MED FILL — URSODIOL 300 MG CAPSULE: ORAL | 30 days supply | Qty: 60 | Fill #4

## 2018-09-14 MED FILL — ACETAMINOPHEN 325 MG TABLET: 30 days supply | Qty: 100 | Fill #1 | Status: AC

## 2018-09-14 MED FILL — SULFAMETHOXAZOLE 400 MG-TRIMETHOPRIM 80 MG TABLET: 28 days supply | Qty: 12 | Fill #4 | Status: AC

## 2018-09-14 MED FILL — SULFAMETHOXAZOLE 400 MG-TRIMETHOPRIM 80 MG TABLET: ORAL | 28 days supply | Qty: 12 | Fill #4

## 2018-09-16 ENCOUNTER — Ambulatory Visit: Admit: 2018-09-16 | Discharge: 2018-09-17 | Payer: PRIVATE HEALTH INSURANCE

## 2018-09-16 DIAGNOSIS — Z5181 Encounter for therapeutic drug level monitoring: Secondary | ICD-10-CM

## 2018-09-16 DIAGNOSIS — E612 Magnesium deficiency: Secondary | ICD-10-CM

## 2018-09-16 DIAGNOSIS — Z944 Liver transplant status: Principal | ICD-10-CM

## 2018-09-16 LAB — CBC W/ AUTO DIFF
BASOPHILS ABSOLUTE COUNT: 0 10*9/L (ref 0.0–0.1)
BASOPHILS RELATIVE PERCENT: 0.8 %
EOSINOPHILS ABSOLUTE COUNT: 0.1 10*9/L (ref 0.0–0.4)
EOSINOPHILS RELATIVE PERCENT: 1.8 %
HEMOGLOBIN: 13.5 g/dL (ref 13.5–16.0)
LYMPHOCYTES ABSOLUTE COUNT: 0.8 10*9/L — ABNORMAL LOW (ref 1.5–5.0)
LYMPHOCYTES RELATIVE PERCENT: 17.6 %
MEAN CORPUSCULAR HEMOGLOBIN CONC: 34.1 g/dL (ref 31.0–37.0)
MEAN CORPUSCULAR HEMOGLOBIN: 32.1 pg (ref 26.0–34.0)
MEAN CORPUSCULAR VOLUME: 94.1 fL (ref 80.0–100.0)
MEAN PLATELET VOLUME: 7.3 fL (ref 7.0–10.0)
NEUTROPHILS ABSOLUTE COUNT: 3.4 10*9/L (ref 2.0–7.5)
NEUTROPHILS RELATIVE PERCENT: 71.9 %
PLATELET COUNT: 113 10*9/L — ABNORMAL LOW (ref 150–440)
RED BLOOD CELL COUNT: 4.2 10*12/L (ref 4.00–5.20)
RED CELL DISTRIBUTION WIDTH: 14.4 % (ref 12.0–15.0)
WBC ADJUSTED: 4.8 10*9/L (ref 4.5–11.0)

## 2018-09-16 LAB — COMPREHENSIVE METABOLIC PANEL
ALBUMIN: 3.5 g/dL (ref 3.5–5.0)
ALKALINE PHOSPHATASE: 66 U/L (ref 38–126)
ALT (SGPT): 44 U/L — ABNORMAL HIGH (ref <35)
ANION GAP: 8 mmol/L (ref 7–15)
AST (SGOT): 22 U/L (ref 14–38)
BILIRUBIN TOTAL: 0.8 mg/dL (ref 0.0–1.2)
BLOOD UREA NITROGEN: 22 mg/dL — ABNORMAL HIGH (ref 7–21)
BUN / CREAT RATIO: 19
CALCIUM: 8.7 mg/dL (ref 8.5–10.2)
CHLORIDE: 104 mmol/L (ref 98–107)
CO2: 27 mmol/L (ref 22.0–30.0)
EGFR CKD-EPI AA FEMALE: 59 mL/min/{1.73_m2} — ABNORMAL LOW (ref >=60)
EGFR CKD-EPI NON-AA FEMALE: 51 mL/min/{1.73_m2} — ABNORMAL LOW (ref >=60)
GLUCOSE RANDOM: 93 mg/dL (ref 65–179)
PROTEIN TOTAL: 5.9 g/dL — ABNORMAL LOW (ref 6.5–8.3)
SODIUM: 139 mmol/L (ref 135–145)

## 2018-09-16 LAB — BILIRUBIN DIRECT: Bilirubin.glucuronidated:MCnc:Pt:Ser/Plas:Qn:: 0.1

## 2018-09-16 LAB — GAMMA GLUTAMYL TRANSFERASE: Gamma glutamyl transferase:CCnc:Pt:Ser/Plas:Qn:: 51 — ABNORMAL HIGH

## 2018-09-16 LAB — MAGNESIUM: Magnesium:MCnc:Pt:Ser/Plas:Qn:: 1.8

## 2018-09-16 LAB — PHOSPHORUS: Phosphate:MCnc:Pt:Ser/Plas:Qn:: 4.4

## 2018-09-16 LAB — MONOCYTES RELATIVE PERCENT: Lab: 6.3

## 2018-09-16 LAB — PROTEIN TOTAL: Protein:MCnc:Pt:Ser/Plas:Qn:: 5.9 — ABNORMAL LOW

## 2018-09-17 LAB — TACROLIMUS, TROUGH: Lab: 5.8

## 2018-09-20 ENCOUNTER — Ambulatory Visit: Admit: 2018-09-20 | Discharge: 2018-09-21 | Payer: PRIVATE HEALTH INSURANCE

## 2018-09-20 DIAGNOSIS — E612 Magnesium deficiency: Secondary | ICD-10-CM

## 2018-09-20 DIAGNOSIS — Z944 Liver transplant status: Principal | ICD-10-CM

## 2018-09-20 DIAGNOSIS — Z5181 Encounter for therapeutic drug level monitoring: Secondary | ICD-10-CM

## 2018-09-20 LAB — GAMMA GLUTAMYL TRANSFERASE: Gamma glutamyl transferase:CCnc:Pt:Ser/Plas:Qn:: 48

## 2018-09-20 LAB — CBC W/ AUTO DIFF
BASOPHILS ABSOLUTE COUNT: 0 10*9/L (ref 0.0–0.1)
BASOPHILS RELATIVE PERCENT: 0.7 %
EOSINOPHILS ABSOLUTE COUNT: 0.1 10*9/L (ref 0.0–0.4)
HEMATOCRIT: 39.8 % (ref 36.0–46.0)
HEMOGLOBIN: 13.5 g/dL (ref 13.5–16.0)
LARGE UNSTAINED CELLS: 1 % (ref 0–4)
LYMPHOCYTES ABSOLUTE COUNT: 1.1 10*9/L — ABNORMAL LOW (ref 1.5–5.0)
LYMPHOCYTES RELATIVE PERCENT: 20.3 %
MEAN CORPUSCULAR HEMOGLOBIN CONC: 33.9 g/dL (ref 31.0–37.0)
MEAN CORPUSCULAR HEMOGLOBIN: 31.8 pg (ref 26.0–34.0)
MEAN CORPUSCULAR VOLUME: 93.9 fL (ref 80.0–100.0)
MEAN PLATELET VOLUME: 7 fL (ref 7.0–10.0)
MONOCYTES ABSOLUTE COUNT: 0.4 10*9/L (ref 0.2–0.8)
MONOCYTES RELATIVE PERCENT: 6.9 %
NEUTROPHILS ABSOLUTE COUNT: 3.8 10*9/L (ref 2.0–7.5)
PLATELET COUNT: 124 10*9/L — ABNORMAL LOW (ref 150–440)
RED CELL DISTRIBUTION WIDTH: 14.5 % (ref 12.0–15.0)
WBC ADJUSTED: 5.5 10*9/L (ref 4.5–11.0)

## 2018-09-20 LAB — COMPREHENSIVE METABOLIC PANEL
ALBUMIN: 3.5 g/dL (ref 3.5–5.0)
ALKALINE PHOSPHATASE: 68 U/L (ref 38–126)
ALT (SGPT): 36 U/L — ABNORMAL HIGH (ref <35)
ANION GAP: 8 mmol/L (ref 7–15)
AST (SGOT): 18 U/L (ref 14–38)
BLOOD UREA NITROGEN: 27 mg/dL — ABNORMAL HIGH (ref 7–21)
BUN / CREAT RATIO: 28
CALCIUM: 8.5 mg/dL (ref 8.5–10.2)
CHLORIDE: 103 mmol/L (ref 98–107)
CO2: 27 mmol/L (ref 22.0–30.0)
EGFR CKD-EPI AA FEMALE: 73 mL/min/{1.73_m2} (ref >=60)
EGFR CKD-EPI NON-AA FEMALE: 64 mL/min/{1.73_m2} (ref >=60)
GLUCOSE RANDOM: 85 mg/dL (ref 65–179)
POTASSIUM: 3.7 mmol/L (ref 3.5–5.0)
PROTEIN TOTAL: 5.8 g/dL — ABNORMAL LOW (ref 6.5–8.3)
SODIUM: 138 mmol/L (ref 135–145)

## 2018-09-20 LAB — PHOSPHORUS: Phosphate:MCnc:Pt:Ser/Plas:Qn:: 4

## 2018-09-20 LAB — HEMOGLOBIN: Lab: 13.5

## 2018-09-20 LAB — MAGNESIUM: Magnesium:MCnc:Pt:Ser/Plas:Qn:: 2

## 2018-09-20 LAB — TACROLIMUS, TROUGH: Lab: 5.9

## 2018-09-20 LAB — AST (SGOT): Aspartate aminotransferase:CCnc:Pt:Ser/Plas:Qn:: 18

## 2018-09-20 LAB — BILIRUBIN DIRECT: Bilirubin.glucuronidated:MCnc:Pt:Ser/Plas:Qn:: 0.1

## 2018-09-21 MED ORDER — PREDNISONE 10 MG TABLET
ORAL_TABLET | Freq: Every day | ORAL | 2 refills | 0 days | Status: CP
Start: 2018-09-21 — End: 2018-12-07

## 2018-09-21 NOTE — Unmapped (Signed)
Patient's txp labs stable after reducing prednisone two weeks prior. Per txp pharmacist, [name], will reduce prednisone again to 20mg . Spoke to patient and relayed dose change instructions, including need for weekly labs. She verbalized understanding.

## 2018-09-23 ENCOUNTER — Ambulatory Visit: Admit: 2018-09-23 | Discharge: 2018-09-24 | Payer: PRIVATE HEALTH INSURANCE

## 2018-09-23 DIAGNOSIS — E612 Magnesium deficiency: Secondary | ICD-10-CM

## 2018-09-23 DIAGNOSIS — Z5181 Encounter for therapeutic drug level monitoring: Secondary | ICD-10-CM

## 2018-09-23 DIAGNOSIS — Z944 Liver transplant status: Principal | ICD-10-CM

## 2018-09-23 LAB — MAGNESIUM: Magnesium:MCnc:Pt:Ser/Plas:Qn:: 2.1

## 2018-09-23 LAB — COMPREHENSIVE METABOLIC PANEL
ALKALINE PHOSPHATASE: 69 U/L (ref 38–126)
ALT (SGPT): 47 U/L — ABNORMAL HIGH (ref <35)
ANION GAP: 8 mmol/L (ref 7–15)
AST (SGOT): 29 U/L (ref 14–38)
BILIRUBIN TOTAL: 0.7 mg/dL (ref 0.0–1.2)
BUN / CREAT RATIO: 24
CALCIUM: 8.6 mg/dL (ref 8.5–10.2)
CHLORIDE: 105 mmol/L (ref 98–107)
CO2: 26 mmol/L (ref 22.0–30.0)
CREATININE: 0.94 mg/dL (ref 0.60–1.00)
EGFR CKD-EPI AA FEMALE: 76 mL/min/{1.73_m2} (ref >=60)
EGFR CKD-EPI NON-AA FEMALE: 66 mL/min/{1.73_m2} (ref >=60)
GLUCOSE RANDOM: 95 mg/dL (ref 65–179)
POTASSIUM: 3.8 mmol/L (ref 3.5–5.0)
PROTEIN TOTAL: 5.8 g/dL — ABNORMAL LOW (ref 6.5–8.3)
SODIUM: 139 mmol/L (ref 135–145)

## 2018-09-23 LAB — CBC W/ AUTO DIFF
BASOPHILS ABSOLUTE COUNT: 0.1 10*9/L (ref 0.0–0.1)
BASOPHILS RELATIVE PERCENT: 1.2 %
EOSINOPHILS ABSOLUTE COUNT: 0.1 10*9/L (ref 0.0–0.4)
EOSINOPHILS RELATIVE PERCENT: 1.8 %
HEMATOCRIT: 39.2 % (ref 36.0–46.0)
LARGE UNSTAINED CELLS: 1 % (ref 0–4)
LYMPHOCYTES ABSOLUTE COUNT: 0.9 10*9/L — ABNORMAL LOW (ref 1.5–5.0)
LYMPHOCYTES RELATIVE PERCENT: 19.5 %
MEAN CORPUSCULAR HEMOGLOBIN: 31.8 pg (ref 26.0–34.0)
MEAN CORPUSCULAR VOLUME: 93.8 fL (ref 80.0–100.0)
MEAN PLATELET VOLUME: 7.1 fL (ref 7.0–10.0)
MONOCYTES ABSOLUTE COUNT: 0.3 10*9/L (ref 0.2–0.8)
NEUTROPHILS ABSOLUTE COUNT: 3.2 10*9/L (ref 2.0–7.5)
NEUTROPHILS RELATIVE PERCENT: 70.8 %
PLATELET COUNT: 121 10*9/L — ABNORMAL LOW (ref 150–440)
RED BLOOD CELL COUNT: 4.18 10*12/L (ref 4.00–5.20)
RED CELL DISTRIBUTION WIDTH: 14.6 % (ref 12.0–15.0)
WBC ADJUSTED: 4.5 10*9/L (ref 4.5–11.0)

## 2018-09-23 LAB — TACROLIMUS, TROUGH: Lab: 6.9

## 2018-09-23 LAB — GAMMA GLUTAMYL TRANSFERASE: Gamma glutamyl transferase:CCnc:Pt:Ser/Plas:Qn:: 61 — ABNORMAL HIGH

## 2018-09-23 LAB — RED CELL DISTRIBUTION WIDTH: Lab: 14.6

## 2018-09-23 LAB — BILIRUBIN DIRECT: Bilirubin.glucuronidated:MCnc:Pt:Ser/Plas:Qn:: 0.1

## 2018-09-23 LAB — PHOSPHORUS: Phosphate:MCnc:Pt:Ser/Plas:Qn:: 4.3

## 2018-09-23 LAB — SODIUM: Sodium:SCnc:Pt:Ser/Plas:Qn:: 139

## 2018-09-27 ENCOUNTER — Ambulatory Visit: Admit: 2018-09-27 | Discharge: 2018-09-28 | Payer: PRIVATE HEALTH INSURANCE

## 2018-09-27 DIAGNOSIS — Z944 Liver transplant status: Principal | ICD-10-CM

## 2018-09-27 DIAGNOSIS — Z5181 Encounter for therapeutic drug level monitoring: Secondary | ICD-10-CM

## 2018-09-27 DIAGNOSIS — E612 Magnesium deficiency: Secondary | ICD-10-CM

## 2018-09-27 LAB — COMPREHENSIVE METABOLIC PANEL
ALBUMIN: 3.5 g/dL (ref 3.5–5.0)
ALKALINE PHOSPHATASE: 68 U/L (ref 38–126)
ALT (SGPT): 47 U/L — ABNORMAL HIGH (ref <35)
ANION GAP: 7 mmol/L (ref 7–15)
AST (SGOT): 24 U/L (ref 14–38)
BILIRUBIN TOTAL: 0.8 mg/dL (ref 0.0–1.2)
BLOOD UREA NITROGEN: 21 mg/dL (ref 7–21)
CALCIUM: 8.7 mg/dL (ref 8.5–10.2)
CHLORIDE: 104 mmol/L (ref 98–107)
CO2: 27 mmol/L (ref 22.0–30.0)
EGFR CKD-EPI NON-AA FEMALE: 55 mL/min/{1.73_m2} — ABNORMAL LOW (ref >=60)
GLUCOSE RANDOM: 93 mg/dL (ref 65–179)
POTASSIUM: 3.8 mmol/L (ref 3.5–5.0)
PROTEIN TOTAL: 5.8 g/dL — ABNORMAL LOW (ref 6.5–8.3)
SODIUM: 138 mmol/L (ref 135–145)

## 2018-09-27 LAB — BILIRUBIN DIRECT: Bilirubin.glucuronidated:MCnc:Pt:Ser/Plas:Qn:: 0.1

## 2018-09-27 LAB — TACROLIMUS, TROUGH: Lab: 7.3

## 2018-09-27 LAB — CBC W/ AUTO DIFF
BASOPHILS ABSOLUTE COUNT: 0 10*9/L (ref 0.0–0.1)
EOSINOPHILS ABSOLUTE COUNT: 0.1 10*9/L (ref 0.0–0.4)
EOSINOPHILS RELATIVE PERCENT: 1.6 %
HEMATOCRIT: 39.4 % (ref 36.0–46.0)
HEMOGLOBIN: 13.4 g/dL — ABNORMAL LOW (ref 13.5–16.0)
LARGE UNSTAINED CELLS: 3 % (ref 0–4)
LYMPHOCYTES ABSOLUTE COUNT: 1 10*9/L — ABNORMAL LOW (ref 1.5–5.0)
LYMPHOCYTES RELATIVE PERCENT: 21 %
MEAN CORPUSCULAR HEMOGLOBIN CONC: 34 g/dL (ref 31.0–37.0)
MEAN CORPUSCULAR HEMOGLOBIN: 31.8 pg (ref 26.0–34.0)
MONOCYTES ABSOLUTE COUNT: 0.3 10*9/L (ref 0.2–0.8)
MONOCYTES RELATIVE PERCENT: 5.7 %
NEUTROPHILS ABSOLUTE COUNT: 3.1 10*9/L (ref 2.0–7.5)
NEUTROPHILS RELATIVE PERCENT: 68.2 %
PLATELET COUNT: 119 10*9/L — ABNORMAL LOW (ref 150–440)
RED BLOOD CELL COUNT: 4.21 10*12/L (ref 4.00–5.20)
RED CELL DISTRIBUTION WIDTH: 14.7 % (ref 12.0–15.0)
WBC ADJUSTED: 4.6 10*9/L (ref 4.5–11.0)

## 2018-09-27 LAB — MEAN CORPUSCULAR VOLUME: Lab: 93.5

## 2018-09-27 LAB — PHOSPHORUS
PHOSPHORUS: 4.9 mg/dL — ABNORMAL HIGH (ref 2.9–4.7)
Phosphate:MCnc:Pt:Ser/Plas:Qn:: 4.9 — ABNORMAL HIGH

## 2018-09-27 LAB — GAMMA GLUTAMYL TRANSFERASE: Gamma glutamyl transferase:CCnc:Pt:Ser/Plas:Qn:: 51 — ABNORMAL HIGH

## 2018-09-27 LAB — MAGNESIUM: Magnesium:MCnc:Pt:Ser/Plas:Qn:: 1.8

## 2018-09-27 LAB — ALKALINE PHOSPHATASE: Alkaline phosphatase:CCnc:Pt:Ser/Plas:Qn:: 68

## 2018-09-30 ENCOUNTER — Ambulatory Visit: Admit: 2018-09-30 | Discharge: 2018-10-01 | Payer: PRIVATE HEALTH INSURANCE

## 2018-09-30 DIAGNOSIS — Z944 Liver transplant status: Principal | ICD-10-CM

## 2018-09-30 DIAGNOSIS — E612 Magnesium deficiency: Secondary | ICD-10-CM

## 2018-09-30 DIAGNOSIS — Z5181 Encounter for therapeutic drug level monitoring: Secondary | ICD-10-CM

## 2018-09-30 LAB — CBC W/ AUTO DIFF
BASOPHILS ABSOLUTE COUNT: 0 10*9/L (ref 0.0–0.1)
BASOPHILS RELATIVE PERCENT: 0.9 %
EOSINOPHILS ABSOLUTE COUNT: 0.1 10*9/L (ref 0.0–0.4)
EOSINOPHILS RELATIVE PERCENT: 1.9 %
HEMATOCRIT: 38.5 % (ref 36.0–46.0)
HEMOGLOBIN: 13.2 g/dL — ABNORMAL LOW (ref 13.5–16.0)
LARGE UNSTAINED CELLS: 2 % (ref 0–4)
LYMPHOCYTES ABSOLUTE COUNT: 0.9 10*9/L — ABNORMAL LOW (ref 1.5–5.0)
MEAN CORPUSCULAR HEMOGLOBIN CONC: 34.3 g/dL (ref 31.0–37.0)
MEAN CORPUSCULAR HEMOGLOBIN: 32.1 pg (ref 26.0–34.0)
MEAN CORPUSCULAR VOLUME: 93.4 fL (ref 80.0–100.0)
MONOCYTES ABSOLUTE COUNT: 0.2 10*9/L (ref 0.2–0.8)
MONOCYTES RELATIVE PERCENT: 5.4 %
NEUTROPHILS ABSOLUTE COUNT: 3 10*9/L (ref 2.0–7.5)
NEUTROPHILS RELATIVE PERCENT: 70.1 %
PLATELET COUNT: 110 10*9/L — ABNORMAL LOW (ref 150–440)
RED BLOOD CELL COUNT: 4.13 10*12/L (ref 4.00–5.20)
RED CELL DISTRIBUTION WIDTH: 14.6 % (ref 12.0–15.0)
WBC ADJUSTED: 4.3 10*9/L — ABNORMAL LOW (ref 4.5–11.0)

## 2018-09-30 LAB — PHOSPHORUS: Phosphate:MCnc:Pt:Ser/Plas:Qn:: 4.9 — ABNORMAL HIGH

## 2018-09-30 LAB — COMPREHENSIVE METABOLIC PANEL
ALBUMIN: 3.6 g/dL (ref 3.5–5.0)
ALKALINE PHOSPHATASE: 59 U/L (ref 38–126)
ANION GAP: 8 mmol/L (ref 7–15)
AST (SGOT): 21 U/L (ref 14–38)
BILIRUBIN TOTAL: 0.8 mg/dL (ref 0.0–1.2)
BLOOD UREA NITROGEN: 24 mg/dL — ABNORMAL HIGH (ref 7–21)
BUN / CREAT RATIO: 20
CALCIUM: 8.6 mg/dL (ref 8.5–10.2)
CHLORIDE: 102 mmol/L (ref 98–107)
CO2: 28 mmol/L (ref 22.0–30.0)
CREATININE: 1.19 mg/dL — ABNORMAL HIGH (ref 0.60–1.00)
EGFR CKD-EPI AA FEMALE: 57 mL/min/{1.73_m2} — ABNORMAL LOW (ref >=60)
EGFR CKD-EPI NON-AA FEMALE: 50 mL/min/{1.73_m2} — ABNORMAL LOW (ref >=60)
GLUCOSE RANDOM: 99 mg/dL (ref 65–179)
POTASSIUM: 3.8 mmol/L (ref 3.5–5.0)
PROTEIN TOTAL: 5.9 g/dL — ABNORMAL LOW (ref 6.5–8.3)
SODIUM: 138 mmol/L (ref 135–145)

## 2018-09-30 LAB — EGFR CKD-EPI AA FEMALE: Lab: 57 — ABNORMAL LOW

## 2018-09-30 LAB — NEUTROPHILS ABSOLUTE COUNT: Lab: 3

## 2018-09-30 LAB — GAMMA GLUTAMYL TRANSFERASE: Gamma glutamyl transferase:CCnc:Pt:Ser/Plas:Qn:: 47

## 2018-09-30 LAB — BILIRUBIN DIRECT: Bilirubin.glucuronidated:MCnc:Pt:Ser/Plas:Qn:: 0.1

## 2018-09-30 LAB — MAGNESIUM: Magnesium:MCnc:Pt:Ser/Plas:Qn:: 1.7

## 2018-09-30 LAB — TACROLIMUS, TROUGH: Lab: 6.8

## 2018-10-04 ENCOUNTER — Ambulatory Visit: Admit: 2018-10-04 | Discharge: 2018-10-05 | Payer: PRIVATE HEALTH INSURANCE

## 2018-10-04 DIAGNOSIS — Z944 Liver transplant status: Principal | ICD-10-CM

## 2018-10-04 DIAGNOSIS — Z5181 Encounter for therapeutic drug level monitoring: Secondary | ICD-10-CM

## 2018-10-04 DIAGNOSIS — E612 Magnesium deficiency: Secondary | ICD-10-CM

## 2018-10-04 LAB — CBC W/ AUTO DIFF
BASOPHILS ABSOLUTE COUNT: 0 10*9/L (ref 0.0–0.1)
BASOPHILS RELATIVE PERCENT: 0.6 %
EOSINOPHILS ABSOLUTE COUNT: 0.1 10*9/L (ref 0.0–0.4)
EOSINOPHILS RELATIVE PERCENT: 1.2 %
HEMOGLOBIN: 13.1 g/dL — ABNORMAL LOW (ref 13.5–16.0)
LYMPHOCYTES ABSOLUTE COUNT: 0.8 10*9/L — ABNORMAL LOW (ref 1.5–5.0)
LYMPHOCYTES RELATIVE PERCENT: 16 %
MEAN CORPUSCULAR HEMOGLOBIN CONC: 34.4 g/dL (ref 31.0–37.0)
MEAN CORPUSCULAR VOLUME: 92.3 fL (ref 80.0–100.0)
MEAN PLATELET VOLUME: 7 fL (ref 7.0–10.0)
MONOCYTES ABSOLUTE COUNT: 0.3 10*9/L (ref 0.2–0.8)
MONOCYTES RELATIVE PERCENT: 6.1 %
NEUTROPHILS ABSOLUTE COUNT: 3.7 10*9/L (ref 2.0–7.5)
NEUTROPHILS RELATIVE PERCENT: 74.4 %
PLATELET COUNT: 126 10*9/L — ABNORMAL LOW (ref 150–440)
RED BLOOD CELL COUNT: 4.13 10*12/L (ref 4.00–5.20)
RED CELL DISTRIBUTION WIDTH: 14.4 % (ref 12.0–15.0)

## 2018-10-04 LAB — COMPREHENSIVE METABOLIC PANEL
ALBUMIN: 3.6 g/dL (ref 3.5–5.0)
ALKALINE PHOSPHATASE: 65 U/L (ref 38–126)
ALT (SGPT): 41 U/L — ABNORMAL HIGH (ref <35)
ANION GAP: 6 mmol/L — ABNORMAL LOW (ref 7–15)
AST (SGOT): 21 U/L (ref 14–38)
BILIRUBIN TOTAL: 0.8 mg/dL (ref 0.0–1.2)
BLOOD UREA NITROGEN: 26 mg/dL — ABNORMAL HIGH (ref 7–21)
CALCIUM: 9.1 mg/dL (ref 8.5–10.2)
CHLORIDE: 102 mmol/L (ref 98–107)
CO2: 29 mmol/L (ref 22.0–30.0)
CREATININE: 1.05 mg/dL — ABNORMAL HIGH (ref 0.60–1.00)
EGFR CKD-EPI AA FEMALE: 67 mL/min/{1.73_m2} (ref >=60)
EGFR CKD-EPI NON-AA FEMALE: 58 mL/min/{1.73_m2} — ABNORMAL LOW (ref >=60)
GLUCOSE RANDOM: 100 mg/dL (ref 65–179)
POTASSIUM: 3.7 mmol/L (ref 3.5–5.0)
PROTEIN TOTAL: 6 g/dL — ABNORMAL LOW (ref 6.5–8.3)
SODIUM: 137 mmol/L (ref 135–145)

## 2018-10-04 LAB — BILIRUBIN DIRECT: Bilirubin.glucuronidated:MCnc:Pt:Ser/Plas:Qn:: <0.1

## 2018-10-04 LAB — RED CELL DISTRIBUTION WIDTH: Lab: 14.4

## 2018-10-04 LAB — PHOSPHORUS: Phosphate:MCnc:Pt:Ser/Plas:Qn:: 4.7

## 2018-10-04 LAB — MAGNESIUM: Magnesium:MCnc:Pt:Ser/Plas:Qn:: 1.8

## 2018-10-04 LAB — EGFR CKD-EPI NON-AA FEMALE: Lab: 58 — ABNORMAL LOW

## 2018-10-04 LAB — TACROLIMUS, TROUGH: Lab: 5.1

## 2018-10-04 LAB — GAMMA GLUTAMYL TRANSFERASE: Gamma glutamyl transferase:CCnc:Pt:Ser/Plas:Qn:: 49 — ABNORMAL HIGH

## 2018-10-05 NOTE — Unmapped (Signed)
Called pt in reference to 12/23 tac level which was subtherapeutic. Pt denies missing any doses. Pt will repeat labs on 12/26. She confirmed that in addition to the Prograf she is taking Myfortic 360 mg BID and Prograf 20 mg daily. Tac level will be re-assesed.

## 2018-10-07 ENCOUNTER — Ambulatory Visit: Admit: 2018-10-07 | Discharge: 2018-10-08 | Payer: PRIVATE HEALTH INSURANCE

## 2018-10-07 DIAGNOSIS — Z5181 Encounter for therapeutic drug level monitoring: Secondary | ICD-10-CM

## 2018-10-07 DIAGNOSIS — Z944 Liver transplant status: Principal | ICD-10-CM

## 2018-10-07 DIAGNOSIS — E612 Magnesium deficiency: Secondary | ICD-10-CM

## 2018-10-07 LAB — COMPREHENSIVE METABOLIC PANEL
ALBUMIN: 3.5 g/dL (ref 3.5–5.0)
ALKALINE PHOSPHATASE: 63 U/L (ref 38–126)
ALT (SGPT): 37 U/L — ABNORMAL HIGH (ref <35)
ANION GAP: 9 mmol/L (ref 7–15)
AST (SGOT): 20 U/L (ref 14–38)
BILIRUBIN TOTAL: 0.6 mg/dL (ref 0.0–1.2)
BLOOD UREA NITROGEN: 26 mg/dL — ABNORMAL HIGH (ref 7–21)
BUN / CREAT RATIO: 21
CALCIUM: 8.7 mg/dL (ref 8.5–10.2)
CHLORIDE: 102 mmol/L (ref 98–107)
CO2: 27 mmol/L (ref 22.0–30.0)
CREATININE: 1.25 mg/dL — ABNORMAL HIGH (ref 0.60–1.00)
EGFR CKD-EPI NON-AA FEMALE: 47 mL/min/{1.73_m2} — ABNORMAL LOW (ref >=60)
GLUCOSE RANDOM: 98 mg/dL (ref 65–179)
POTASSIUM: 3.6 mmol/L (ref 3.5–5.0)
PROTEIN TOTAL: 5.9 g/dL — ABNORMAL LOW (ref 6.5–8.3)
SODIUM: 138 mmol/L (ref 135–145)

## 2018-10-07 LAB — GAMMA GLUTAMYL TRANSFERASE: Gamma glutamyl transferase:CCnc:Pt:Ser/Plas:Qn:: 46

## 2018-10-07 LAB — CBC W/ AUTO DIFF
BASOPHILS ABSOLUTE COUNT: 0 10*9/L (ref 0.0–0.1)
BASOPHILS RELATIVE PERCENT: 0.8 %
EOSINOPHILS ABSOLUTE COUNT: 0.1 10*9/L (ref 0.0–0.4)
HEMATOCRIT: 37 % (ref 36.0–46.0)
HEMOGLOBIN: 12.8 g/dL — ABNORMAL LOW (ref 13.5–16.0)
LARGE UNSTAINED CELLS: 1 % (ref 0–4)
LYMPHOCYTES ABSOLUTE COUNT: 0.8 10*9/L — ABNORMAL LOW (ref 1.5–5.0)
LYMPHOCYTES RELATIVE PERCENT: 14.2 %
MEAN CORPUSCULAR HEMOGLOBIN CONC: 34.6 g/dL (ref 31.0–37.0)
MEAN CORPUSCULAR HEMOGLOBIN: 32.1 pg (ref 26.0–34.0)
MEAN PLATELET VOLUME: 7.3 fL (ref 7.0–10.0)
MONOCYTES ABSOLUTE COUNT: 0.3 10*9/L (ref 0.2–0.8)
MONOCYTES RELATIVE PERCENT: 4.6 %
NEUTROPHILS ABSOLUTE COUNT: 4.2 10*9/L (ref 2.0–7.5)
NEUTROPHILS RELATIVE PERCENT: 77.8 %
PLATELET COUNT: 128 10*9/L — ABNORMAL LOW (ref 150–440)
RED BLOOD CELL COUNT: 3.98 10*12/L — ABNORMAL LOW (ref 4.00–5.20)
RED CELL DISTRIBUTION WIDTH: 14.9 % (ref 12.0–15.0)
WBC ADJUSTED: 5.4 10*9/L (ref 4.5–11.0)

## 2018-10-07 LAB — PHOSPHORUS: Phosphate:MCnc:Pt:Ser/Plas:Qn:: 5 — ABNORMAL HIGH

## 2018-10-07 LAB — TACROLIMUS, TROUGH: Lab: 7.2

## 2018-10-07 LAB — MAGNESIUM: Magnesium:MCnc:Pt:Ser/Plas:Qn:: 1.9

## 2018-10-07 LAB — BILIRUBIN TOTAL: Bilirubin:MCnc:Pt:Ser/Plas:Qn:: 0.6

## 2018-10-07 LAB — BILIRUBIN DIRECT: Bilirubin.glucuronidated:MCnc:Pt:Ser/Plas:Qn:: 0.1

## 2018-10-07 LAB — MEAN CORPUSCULAR VOLUME: Lab: 93

## 2018-10-07 LAB — BILIRUBIN, DIRECT: BILIRUBIN DIRECT: 0.1 mg/dL (ref 0.00–0.40)

## 2018-10-08 NOTE — Unmapped (Signed)
Patient's labs yesterday with elevated creatinine. Spoke to her today and she said she had been running around a lot, preparing for Christmas and had been remiss with her hydration. She said she was working on it now. Labs will be repeated next week.

## 2018-10-11 ENCOUNTER — Ambulatory Visit: Admit: 2018-10-11 | Discharge: 2018-10-12 | Payer: PRIVATE HEALTH INSURANCE

## 2018-10-11 DIAGNOSIS — E612 Magnesium deficiency: Secondary | ICD-10-CM

## 2018-10-11 DIAGNOSIS — Z5181 Encounter for therapeutic drug level monitoring: Secondary | ICD-10-CM

## 2018-10-11 DIAGNOSIS — Z944 Liver transplant status: Principal | ICD-10-CM

## 2018-10-11 LAB — CBC W/ AUTO DIFF
BASOPHILS ABSOLUTE COUNT: 0.1 10*9/L (ref 0.0–0.1)
BASOPHILS RELATIVE PERCENT: 1.8 %
EOSINOPHILS ABSOLUTE COUNT: 0.1 10*9/L (ref 0.0–0.4)
EOSINOPHILS RELATIVE PERCENT: 2.7 %
HEMATOCRIT: 37.1 % (ref 36.0–46.0)
HEMOGLOBIN: 12.8 g/dL — ABNORMAL LOW (ref 13.5–16.0)
LARGE UNSTAINED CELLS: 2 % (ref 0–4)
LYMPHOCYTES ABSOLUTE COUNT: 0.9 10*9/L — ABNORMAL LOW (ref 1.5–5.0)
LYMPHOCYTES RELATIVE PERCENT: 18.9 %
MEAN CORPUSCULAR VOLUME: 92.4 fL (ref 80.0–100.0)
MEAN PLATELET VOLUME: 7.3 fL (ref 7.0–10.0)
MONOCYTES ABSOLUTE COUNT: 0.3 10*9/L (ref 0.2–0.8)
MONOCYTES RELATIVE PERCENT: 6.7 %
NEUTROPHILS ABSOLUTE COUNT: 3.1 10*9/L (ref 2.0–7.5)
NEUTROPHILS RELATIVE PERCENT: 68.4 %
PLATELET COUNT: 143 10*9/L — ABNORMAL LOW (ref 150–440)
RED BLOOD CELL COUNT: 4.02 10*12/L (ref 4.00–5.20)
RED CELL DISTRIBUTION WIDTH: 14.4 % (ref 12.0–15.0)
WBC ADJUSTED: 4.5 10*9/L (ref 4.5–11.0)

## 2018-10-11 LAB — COMPREHENSIVE METABOLIC PANEL
ALKALINE PHOSPHATASE: 76 U/L (ref 38–126)
ALT (SGPT): 55 U/L — ABNORMAL HIGH (ref <35)
AST (SGOT): 23 U/L (ref 14–38)
BILIRUBIN TOTAL: 0.5 mg/dL (ref 0.0–1.2)
BUN / CREAT RATIO: 18
CALCIUM: 8.7 mg/dL (ref 8.5–10.2)
CHLORIDE: 102 mmol/L (ref 98–107)
CO2: 30 mmol/L (ref 22.0–30.0)
CREATININE: 0.95 mg/dL (ref 0.60–1.00)
EGFR CKD-EPI AA FEMALE: 75 mL/min/{1.73_m2} (ref >=60)
EGFR CKD-EPI NON-AA FEMALE: 65 mL/min/{1.73_m2} (ref >=60)
GLUCOSE RANDOM: 93 mg/dL (ref 65–179)
POTASSIUM: 3.8 mmol/L (ref 3.5–5.0)
PROTEIN TOTAL: 5.7 g/dL — ABNORMAL LOW (ref 6.5–8.3)
SODIUM: 138 mmol/L (ref 135–145)

## 2018-10-11 LAB — GAMMA GLUTAMYL TRANSFERASE: Gamma glutamyl transferase:CCnc:Pt:Ser/Plas:Qn:: 76 — ABNORMAL HIGH

## 2018-10-11 LAB — PHOSPHORUS: Phosphate:MCnc:Pt:Ser/Plas:Qn:: 4

## 2018-10-11 LAB — TACROLIMUS LEVEL, TROUGH: TACROLIMUS, TROUGH: 4.8 ng/mL — ABNORMAL LOW (ref 5.0–15.0)

## 2018-10-11 LAB — MAGNESIUM: Magnesium:MCnc:Pt:Ser/Plas:Qn:: 1.9

## 2018-10-11 LAB — MEAN CORPUSCULAR HEMOGLOBIN CONC: Lab: 34.5

## 2018-10-11 LAB — ANION GAP: Anion gap 3:SCnc:Pt:Ser/Plas:Qn:: 6 — ABNORMAL LOW

## 2018-10-11 LAB — TACROLIMUS, TROUGH: Lab: 4.8 — ABNORMAL LOW

## 2018-10-11 LAB — BILIRUBIN DIRECT: Bilirubin.glucuronidated:MCnc:Pt:Ser/Plas:Qn:: 0.1

## 2018-10-12 MED ORDER — PROGRAF 1 MG CAPSULE
ORAL_CAPSULE | 11 refills | 0 days | Status: CP
Start: 2018-10-12 — End: 2018-11-23

## 2018-10-12 NOTE — Unmapped (Signed)
Addended by: Genia Harold on: 10/12/2018 12:25 PM     Modules accepted: Orders

## 2018-10-12 NOTE — Unmapped (Addendum)
Patient's 12/30 tac level subpar and ALT higher. Spoke with patient who explained that she has had a cold,but her tac level is reliable. She also inquired if her ASA dose should be held, since lfts have improved and a bx has not been recommended. Messaged txp pharmacist, [name], who recommended that the patient restart her 81mg  aspirin dose, increase her tac to 7mg /6mg  daily, repeat labs twice weekly for another week, and if lfts continue to increase, will plan to increase steroid back to 25mg . Next week, patient can start weekly labs. Relayed recommendations to patient, who verbalized understanding/agreement with plan.

## 2018-10-14 ENCOUNTER — Ambulatory Visit: Admit: 2018-10-14 | Discharge: 2018-10-15 | Payer: PRIVATE HEALTH INSURANCE

## 2018-10-14 DIAGNOSIS — Z944 Liver transplant status: Principal | ICD-10-CM

## 2018-10-14 DIAGNOSIS — Z5181 Encounter for therapeutic drug level monitoring: Secondary | ICD-10-CM

## 2018-10-14 DIAGNOSIS — E612 Magnesium deficiency: Secondary | ICD-10-CM

## 2018-10-14 LAB — CBC W/ AUTO DIFF
BASOPHILS ABSOLUTE COUNT: 0.1 10*9/L (ref 0.0–0.1)
EOSINOPHILS ABSOLUTE COUNT: 0.1 10*9/L (ref 0.0–0.4)
EOSINOPHILS RELATIVE PERCENT: 1.3 %
HEMATOCRIT: 36.8 % (ref 36.0–46.0)
HEMOGLOBIN: 12.7 g/dL — ABNORMAL LOW (ref 13.5–16.0)
LARGE UNSTAINED CELLS: 1 % (ref 0–4)
LYMPHOCYTES ABSOLUTE COUNT: 0.8 10*9/L — ABNORMAL LOW (ref 1.5–5.0)
LYMPHOCYTES RELATIVE PERCENT: 18.2 %
MEAN CORPUSCULAR HEMOGLOBIN: 32.1 pg (ref 26.0–34.0)
MEAN CORPUSCULAR VOLUME: 92.7 fL (ref 80.0–100.0)
MEAN PLATELET VOLUME: 7 fL (ref 7.0–10.0)
MONOCYTES ABSOLUTE COUNT: 0.3 10*9/L (ref 0.2–0.8)
MONOCYTES RELATIVE PERCENT: 6 %
NEUTROPHILS RELATIVE PERCENT: 71.8 %
PLATELET COUNT: 147 10*9/L — ABNORMAL LOW (ref 150–440)
RED BLOOD CELL COUNT: 3.97 10*12/L — ABNORMAL LOW (ref 4.00–5.20)
WBC ADJUSTED: 4.6 10*9/L (ref 4.5–11.0)

## 2018-10-14 LAB — COMPREHENSIVE METABOLIC PANEL
ALBUMIN: 3.3 g/dL — ABNORMAL LOW (ref 3.5–5.0)
ALKALINE PHOSPHATASE: 67 U/L (ref 38–126)
ALT (SGPT): 35 U/L — ABNORMAL HIGH (ref <35)
ANION GAP: 7 mmol/L (ref 7–15)
AST (SGOT): 18 U/L (ref 14–38)
BILIRUBIN TOTAL: 0.5 mg/dL (ref 0.0–1.2)
BLOOD UREA NITROGEN: 17 mg/dL (ref 7–21)
BUN / CREAT RATIO: 15
CHLORIDE: 103 mmol/L (ref 98–107)
CO2: 27 mmol/L (ref 22.0–30.0)
CREATININE: 1.11 mg/dL — ABNORMAL HIGH (ref 0.60–1.00)
EGFR CKD-EPI AA FEMALE: 62 mL/min/{1.73_m2} (ref >=60)
EGFR CKD-EPI NON-AA FEMALE: 54 mL/min/{1.73_m2} — ABNORMAL LOW (ref >=60)
GLUCOSE RANDOM: 89 mg/dL (ref 65–179)
POTASSIUM: 4 mmol/L (ref 3.5–5.0)
PROTEIN TOTAL: 5.6 g/dL — ABNORMAL LOW (ref 6.5–8.3)
SODIUM: 137 mmol/L (ref 135–145)

## 2018-10-14 LAB — GLUCOSE RANDOM: Glucose:MCnc:Pt:Ser/Plas:Qn:: 89

## 2018-10-14 LAB — BASOPHILS RELATIVE PERCENT: Lab: 1.4

## 2018-10-14 LAB — TACROLIMUS, TROUGH: Lab: 7

## 2018-10-14 LAB — BILIRUBIN DIRECT: Bilirubin.glucuronidated:MCnc:Pt:Ser/Plas:Qn:: 0.1

## 2018-10-14 LAB — PHOSPHORUS: Phosphate:MCnc:Pt:Ser/Plas:Qn:: 4.8 — ABNORMAL HIGH

## 2018-10-14 LAB — MAGNESIUM: Magnesium:MCnc:Pt:Ser/Plas:Qn:: 1.8

## 2018-10-14 LAB — GAMMA GLUTAMYL TRANSFERASE: Gamma glutamyl transferase:CCnc:Pt:Ser/Plas:Qn:: 63 — ABNORMAL HIGH

## 2018-10-18 ENCOUNTER — Ambulatory Visit: Admit: 2018-10-18 | Discharge: 2018-10-19 | Payer: PRIVATE HEALTH INSURANCE

## 2018-10-18 DIAGNOSIS — Z944 Liver transplant status: Principal | ICD-10-CM

## 2018-10-18 DIAGNOSIS — Z5181 Encounter for therapeutic drug level monitoring: Secondary | ICD-10-CM

## 2018-10-18 DIAGNOSIS — E612 Magnesium deficiency: Secondary | ICD-10-CM

## 2018-10-18 LAB — COMPREHENSIVE METABOLIC PANEL
ALBUMIN: 3.4 g/dL — ABNORMAL LOW (ref 3.5–5.0)
ALKALINE PHOSPHATASE: 69 U/L (ref 38–126)
ALT (SGPT): 30 U/L (ref <35)
AST (SGOT): 18 U/L (ref 14–38)
BILIRUBIN TOTAL: 0.4 mg/dL (ref 0.0–1.2)
BLOOD UREA NITROGEN: 24 mg/dL — ABNORMAL HIGH (ref 7–21)
BUN / CREAT RATIO: 21
CALCIUM: 8.9 mg/dL (ref 8.5–10.2)
CHLORIDE: 103 mmol/L (ref 98–107)
CO2: 27 mmol/L (ref 22.0–30.0)
CREATININE: 1.15 mg/dL — ABNORMAL HIGH (ref 0.60–1.00)
EGFR CKD-EPI NON-AA FEMALE: 52 mL/min/{1.73_m2} — ABNORMAL LOW (ref >=60)
GLUCOSE RANDOM: 100 mg/dL (ref 65–179)
POTASSIUM: 4 mmol/L (ref 3.5–5.0)
PROTEIN TOTAL: 5.7 g/dL — ABNORMAL LOW (ref 6.5–8.3)
SODIUM: 137 mmol/L (ref 135–145)

## 2018-10-18 LAB — CBC W/ AUTO DIFF
BASOPHILS ABSOLUTE COUNT: 0.1 10*9/L (ref 0.0–0.1)
EOSINOPHILS ABSOLUTE COUNT: 0.1 10*9/L (ref 0.0–0.4)
EOSINOPHILS RELATIVE PERCENT: 1.6 %
HEMATOCRIT: 38.6 % (ref 36.0–46.0)
HEMOGLOBIN: 13.1 g/dL — ABNORMAL LOW (ref 13.5–16.0)
LARGE UNSTAINED CELLS: 1 % (ref 0–4)
LYMPHOCYTES ABSOLUTE COUNT: 0.9 10*9/L — ABNORMAL LOW (ref 1.5–5.0)
LYMPHOCYTES RELATIVE PERCENT: 19.1 %
MEAN CORPUSCULAR HEMOGLOBIN CONC: 33.9 g/dL (ref 31.0–37.0)
MEAN CORPUSCULAR HEMOGLOBIN: 31.6 pg (ref 26.0–34.0)
MEAN CORPUSCULAR VOLUME: 93.2 fL (ref 80.0–100.0)
MEAN PLATELET VOLUME: 7.2 fL (ref 7.0–10.0)
MONOCYTES ABSOLUTE COUNT: 0.2 10*9/L (ref 0.2–0.8)
NEUTROPHILS ABSOLUTE COUNT: 3.6 10*9/L (ref 2.0–7.5)
NEUTROPHILS RELATIVE PERCENT: 73.2 %
PLATELET COUNT: 178 10*9/L (ref 150–440)
RED CELL DISTRIBUTION WIDTH: 15 % (ref 12.0–15.0)
WBC ADJUSTED: 4.9 10*9/L (ref 4.5–11.0)

## 2018-10-18 LAB — GAMMA GLUTAMYL TRANSFERASE: Gamma glutamyl transferase:CCnc:Pt:Ser/Plas:Qn:: 58 — ABNORMAL HIGH

## 2018-10-18 LAB — MAGNESIUM: Magnesium:MCnc:Pt:Ser/Plas:Qn:: 1.8

## 2018-10-18 LAB — CO2: Carbon dioxide:SCnc:Pt:Ser/Plas:Qn:: 27

## 2018-10-18 LAB — BILIRUBIN DIRECT: Bilirubin.glucuronidated:MCnc:Pt:Ser/Plas:Qn:: <0.1

## 2018-10-18 LAB — MONOCYTES ABSOLUTE COUNT: Lab: 0.2

## 2018-10-18 LAB — TACROLIMUS, TROUGH: Lab: 7.8

## 2018-10-18 LAB — PHOSPHORUS: Phosphate:MCnc:Pt:Ser/Plas:Qn:: 5.4 — ABNORMAL HIGH

## 2018-10-21 ENCOUNTER — Ambulatory Visit: Admit: 2018-10-21 | Discharge: 2018-10-22 | Payer: PRIVATE HEALTH INSURANCE

## 2018-10-21 DIAGNOSIS — Z5181 Encounter for therapeutic drug level monitoring: Secondary | ICD-10-CM

## 2018-10-21 DIAGNOSIS — E612 Magnesium deficiency: Secondary | ICD-10-CM

## 2018-10-21 DIAGNOSIS — Z944 Liver transplant status: Principal | ICD-10-CM

## 2018-10-21 LAB — CBC W/ AUTO DIFF
BASOPHILS ABSOLUTE COUNT: 0 10*9/L (ref 0.0–0.1)
BASOPHILS RELATIVE PERCENT: 0.7 %
EOSINOPHILS ABSOLUTE COUNT: 0.1 10*9/L (ref 0.0–0.4)
EOSINOPHILS RELATIVE PERCENT: 1.1 %
HEMATOCRIT: 37.8 % (ref 36.0–46.0)
HEMOGLOBIN: 12.8 g/dL — ABNORMAL LOW (ref 13.5–16.0)
LARGE UNSTAINED CELLS: 2 % (ref 0–4)
LYMPHOCYTES ABSOLUTE COUNT: 1 10*9/L — ABNORMAL LOW (ref 1.5–5.0)
LYMPHOCYTES RELATIVE PERCENT: 22.4 %
MEAN CORPUSCULAR HEMOGLOBIN CONC: 34 g/dL (ref 31.0–37.0)
MEAN CORPUSCULAR HEMOGLOBIN: 31.3 pg (ref 26.0–34.0)
MEAN PLATELET VOLUME: 7.1 fL (ref 7.0–10.0)
MONOCYTES ABSOLUTE COUNT: 0.3 10*9/L (ref 0.2–0.8)
MONOCYTES RELATIVE PERCENT: 5.4 %
NEUTROPHILS RELATIVE PERCENT: 68.8 %
PLATELET COUNT: 161 10*9/L (ref 150–440)
RED BLOOD CELL COUNT: 4.09 10*12/L (ref 4.00–5.20)
RED CELL DISTRIBUTION WIDTH: 14.8 % (ref 12.0–15.0)
WBC ADJUSTED: 4.5 10*9/L (ref 4.5–11.0)

## 2018-10-21 LAB — COMPREHENSIVE METABOLIC PANEL
ALBUMIN: 3.6 g/dL (ref 3.5–5.0)
ALKALINE PHOSPHATASE: 67 U/L (ref 38–126)
ALT (SGPT): 32 U/L (ref <35)
ANION GAP: 9 mmol/L (ref 7–15)
AST (SGOT): 20 U/L (ref 14–38)
BILIRUBIN TOTAL: 0.5 mg/dL (ref 0.0–1.2)
BUN / CREAT RATIO: 19
CALCIUM: 8.8 mg/dL (ref 8.5–10.2)
CHLORIDE: 100 mmol/L (ref 98–107)
CREATININE: 1.14 mg/dL — ABNORMAL HIGH (ref 0.60–1.00)
EGFR CKD-EPI AA FEMALE: 60 mL/min/{1.73_m2} (ref >=60)
EGFR CKD-EPI NON-AA FEMALE: 52 mL/min/{1.73_m2} — ABNORMAL LOW (ref >=60)
GLUCOSE RANDOM: 88 mg/dL (ref 65–179)
POTASSIUM: 4.1 mmol/L (ref 3.5–5.0)
PROTEIN TOTAL: 5.8 g/dL — ABNORMAL LOW (ref 6.5–8.3)
SODIUM: 137 mmol/L (ref 135–145)

## 2018-10-21 LAB — GAMMA GLUTAMYL TRANSFERASE: Gamma glutamyl transferase:CCnc:Pt:Ser/Plas:Qn:: 58 — ABNORMAL HIGH

## 2018-10-21 LAB — POTASSIUM: Potassium:SCnc:Pt:Ser/Plas:Qn:: 4.1

## 2018-10-21 LAB — BILIRUBIN DIRECT: Bilirubin.glucuronidated:MCnc:Pt:Ser/Plas:Qn:: 0.1

## 2018-10-21 LAB — MAGNESIUM: Magnesium:MCnc:Pt:Ser/Plas:Qn:: 1.9

## 2018-10-21 LAB — PHOSPHORUS: Phosphate:MCnc:Pt:Ser/Plas:Qn:: 4.9 — ABNORMAL HIGH

## 2018-10-21 LAB — NEUTROPHILS ABSOLUTE COUNT: Lab: 3.1

## 2018-10-22 LAB — TACROLIMUS, TROUGH: Lab: 7.1

## 2018-10-22 NOTE — Unmapped (Signed)
01/10-Patient is currently getting her specialty medications filled at [facility], patient will be responsible for calling refills for non-specialty medications.

## 2018-10-23 NOTE — Unmapped (Signed)
Contacted patient to let her know her liver numbers were stable since reducing her prednisone in December, and she could now assume a weekly lab interval. She said she had had a cold for 2 weeks now, and thought it was improving, but her husband said she was a bit wheezy in her sleep. Encouraged her to increase hydration and f/u with her pcp today to see if weekend hrs were available to evaluate her. She verbalized agreement.

## 2018-10-25 ENCOUNTER — Ambulatory Visit: Admit: 2018-10-25 | Discharge: 2018-10-26 | Payer: PRIVATE HEALTH INSURANCE

## 2018-10-25 DIAGNOSIS — E612 Magnesium deficiency: Secondary | ICD-10-CM

## 2018-10-25 DIAGNOSIS — Z5181 Encounter for therapeutic drug level monitoring: Secondary | ICD-10-CM

## 2018-10-25 DIAGNOSIS — Z944 Liver transplant status: Principal | ICD-10-CM

## 2018-11-02 ENCOUNTER — Ambulatory Visit: Admit: 2018-11-02 | Discharge: 2018-11-03 | Payer: PRIVATE HEALTH INSURANCE

## 2018-11-02 DIAGNOSIS — E612 Magnesium deficiency: Secondary | ICD-10-CM

## 2018-11-02 DIAGNOSIS — Z944 Liver transplant status: Principal | ICD-10-CM

## 2018-11-02 DIAGNOSIS — Z5181 Encounter for therapeutic drug level monitoring: Secondary | ICD-10-CM

## 2018-11-03 MED FILL — URSODIOL 300 MG CAPSULE: 30 days supply | Qty: 60 | Fill #5 | Status: AC

## 2018-11-03 MED FILL — URSODIOL 300 MG CAPSULE: ORAL | 30 days supply | Qty: 60 | Fill #5

## 2018-11-11 ENCOUNTER — Ambulatory Visit: Admit: 2018-11-11 | Discharge: 2018-11-12 | Payer: PRIVATE HEALTH INSURANCE

## 2018-11-11 DIAGNOSIS — Z5181 Encounter for therapeutic drug level monitoring: Secondary | ICD-10-CM

## 2018-11-11 DIAGNOSIS — E559 Vitamin D deficiency, unspecified: Secondary | ICD-10-CM

## 2018-11-11 DIAGNOSIS — E612 Magnesium deficiency: Secondary | ICD-10-CM

## 2018-11-11 DIAGNOSIS — E789 Disorder of lipoprotein metabolism, unspecified: Secondary | ICD-10-CM

## 2018-11-11 DIAGNOSIS — Z944 Liver transplant status: Principal | ICD-10-CM

## 2018-11-12 ENCOUNTER — Other Ambulatory Visit: Payer: Self-pay | Admitting: Physical Medicine and Rehabilitation

## 2018-11-12 DIAGNOSIS — M5416 Radiculopathy, lumbar region: Secondary | ICD-10-CM

## 2018-11-16 ENCOUNTER — Ambulatory Visit
Admission: RE | Admit: 2018-11-16 | Discharge: 2018-11-16 | Disposition: A | Discharge status: Home / Self Care | Payer: BC Managed Care – PPO | Source: Ambulatory Visit | Attending: Physical Medicine and Rehabilitation | Admitting: Physical Medicine and Rehabilitation

## 2018-11-16 DIAGNOSIS — M5416 Radiculopathy, lumbar region: Secondary | ICD-10-CM | POA: Insufficient documentation

## 2018-11-19 ENCOUNTER — Other Ambulatory Visit: Admit: 2018-11-19 | Discharge: 2018-11-20 | Payer: PRIVATE HEALTH INSURANCE

## 2018-11-19 DIAGNOSIS — Z944 Liver transplant status: Principal | ICD-10-CM

## 2018-11-19 DIAGNOSIS — Z5181 Encounter for therapeutic drug level monitoring: Secondary | ICD-10-CM

## 2018-11-19 DIAGNOSIS — E612 Magnesium deficiency: Secondary | ICD-10-CM

## 2018-11-23 MED ORDER — PROGRAF 1 MG CAPSULE
ORAL_CAPSULE | Freq: Two times a day (BID) | ORAL | 11 refills | 0.00000 days | Status: CP
Start: 2018-11-23 — End: 2018-12-07

## 2018-11-29 ENCOUNTER — Ambulatory Visit: Admit: 2018-11-29 | Discharge: 2018-11-30 | Payer: PRIVATE HEALTH INSURANCE

## 2018-11-29 DIAGNOSIS — E612 Magnesium deficiency: Secondary | ICD-10-CM

## 2018-11-29 DIAGNOSIS — Z5181 Encounter for therapeutic drug level monitoring: Secondary | ICD-10-CM

## 2018-11-29 DIAGNOSIS — Z944 Liver transplant status: Principal | ICD-10-CM

## 2018-12-01 MED ORDER — SULFAMETHOXAZOLE 400 MG-TRIMETHOPRIM 80 MG TABLET
ORAL | 2 refills | 0 days | Status: CP
Start: 2018-12-01 — End: 2018-12-07

## 2018-12-06 ENCOUNTER — Ambulatory Visit
Admit: 2018-12-06 | Discharge: 2018-12-06 | Payer: PRIVATE HEALTH INSURANCE | Attending: Health Service | Primary: Health Service

## 2018-12-06 ENCOUNTER — Ambulatory Visit: Admit: 2018-12-06 | Discharge: 2018-12-06 | Payer: PRIVATE HEALTH INSURANCE

## 2018-12-06 ENCOUNTER — Institutional Professional Consult (permissible substitution): Admit: 2018-12-06 | Discharge: 2018-12-06 | Payer: PRIVATE HEALTH INSURANCE

## 2018-12-06 ENCOUNTER — Ambulatory Visit
Admit: 2018-12-06 | Discharge: 2018-12-06 | Payer: PRIVATE HEALTH INSURANCE | Attending: Nutritionist | Primary: Nutritionist

## 2018-12-06 ENCOUNTER — Other Ambulatory Visit: Admit: 2018-12-06 | Discharge: 2018-12-06 | Payer: PRIVATE HEALTH INSURANCE

## 2018-12-06 DIAGNOSIS — Z944 Liver transplant status: Principal | ICD-10-CM

## 2018-12-06 DIAGNOSIS — E612 Magnesium deficiency: Secondary | ICD-10-CM

## 2018-12-06 DIAGNOSIS — D899 Disorder involving the immune mechanism, unspecified: Secondary | ICD-10-CM

## 2018-12-06 DIAGNOSIS — B259 Cytomegaloviral disease, unspecified: Secondary | ICD-10-CM

## 2018-12-06 DIAGNOSIS — K769 Liver disease, unspecified: Secondary | ICD-10-CM

## 2018-12-06 DIAGNOSIS — Z5181 Encounter for therapeutic drug level monitoring: Secondary | ICD-10-CM

## 2018-12-06 DIAGNOSIS — E559 Vitamin D deficiency, unspecified: Secondary | ICD-10-CM

## 2018-12-06 DIAGNOSIS — F4321 Adjustment disorder with depressed mood: Principal | ICD-10-CM

## 2018-12-06 MED ORDER — URSODIOL 300 MG CAPSULE
Freq: Two times a day (BID) | ORAL | 11 refills | 0 days | Status: CP
Start: 2018-12-06 — End: 2019-05-02
  Filled 2018-12-06: qty 60, 30d supply, fill #6

## 2018-12-06 MED FILL — URSODIOL 300 MG CAPSULE: 30 days supply | Qty: 60 | Fill #6 | Status: AC

## 2018-12-07 MED ORDER — PREDNISONE 10 MG TABLET
ORAL_TABLET | Freq: Every day | ORAL | 2 refills | 0.00000 days | Status: CP
Start: 2018-12-07 — End: 2019-01-31

## 2018-12-07 MED ORDER — PROGRAF 1 MG CAPSULE
ORAL_CAPSULE | 11 refills | 0 days
Start: 2018-12-07 — End: 2019-03-02

## 2018-12-13 ENCOUNTER — Ambulatory Visit: Admit: 2018-12-13 | Discharge: 2018-12-14 | Payer: PRIVATE HEALTH INSURANCE

## 2018-12-13 DIAGNOSIS — Z5181 Encounter for therapeutic drug level monitoring: Principal | ICD-10-CM

## 2018-12-13 DIAGNOSIS — Z944 Liver transplant status: Principal | ICD-10-CM

## 2018-12-13 DIAGNOSIS — E612 Magnesium deficiency: Principal | ICD-10-CM

## 2018-12-20 ENCOUNTER — Ambulatory Visit: Admit: 2018-12-20 | Discharge: 2018-12-21 | Payer: PRIVATE HEALTH INSURANCE

## 2018-12-20 DIAGNOSIS — Z944 Liver transplant status: Principal | ICD-10-CM

## 2018-12-20 DIAGNOSIS — Z5181 Encounter for therapeutic drug level monitoring: Principal | ICD-10-CM

## 2018-12-20 DIAGNOSIS — E612 Magnesium deficiency: Principal | ICD-10-CM

## 2018-12-27 ENCOUNTER — Ambulatory Visit: Admit: 2018-12-27 | Discharge: 2018-12-28 | Payer: PRIVATE HEALTH INSURANCE

## 2018-12-27 DIAGNOSIS — E612 Magnesium deficiency: Principal | ICD-10-CM

## 2018-12-27 DIAGNOSIS — Z5181 Encounter for therapeutic drug level monitoring: Principal | ICD-10-CM

## 2018-12-27 DIAGNOSIS — Z944 Liver transplant status: Principal | ICD-10-CM

## 2018-12-27 DIAGNOSIS — B259 Cytomegaloviral disease, unspecified: Principal | ICD-10-CM

## 2019-01-03 ENCOUNTER — Ambulatory Visit: Admit: 2019-01-03 | Discharge: 2019-01-04 | Payer: PRIVATE HEALTH INSURANCE

## 2019-01-03 DIAGNOSIS — E039 Hypothyroidism, unspecified: Principal | ICD-10-CM

## 2019-01-03 DIAGNOSIS — B259 Cytomegaloviral disease, unspecified: Principal | ICD-10-CM

## 2019-01-03 DIAGNOSIS — Z944 Liver transplant status: Principal | ICD-10-CM

## 2019-01-03 DIAGNOSIS — Z5181 Encounter for therapeutic drug level monitoring: Principal | ICD-10-CM

## 2019-01-03 DIAGNOSIS — E612 Magnesium deficiency: Principal | ICD-10-CM

## 2019-01-06 MED ORDER — OMEPRAZOLE 40 MG CAPSULE,DELAYED RELEASE
ORAL_CAPSULE | Freq: Every day | ORAL | 11 refills | 0.00000 days | Status: CP
Start: 2019-01-06 — End: 2020-01-06

## 2019-01-10 ENCOUNTER — Ambulatory Visit: Admit: 2019-01-10 | Discharge: 2019-01-11 | Payer: PRIVATE HEALTH INSURANCE

## 2019-01-10 DIAGNOSIS — Z5181 Encounter for therapeutic drug level monitoring: Principal | ICD-10-CM

## 2019-01-10 DIAGNOSIS — E612 Magnesium deficiency: Principal | ICD-10-CM

## 2019-01-10 DIAGNOSIS — Z944 Liver transplant status: Principal | ICD-10-CM

## 2019-01-17 ENCOUNTER — Ambulatory Visit: Admit: 2019-01-17 | Discharge: 2019-01-18 | Payer: PRIVATE HEALTH INSURANCE

## 2019-01-17 DIAGNOSIS — Z944 Liver transplant status: Principal | ICD-10-CM

## 2019-01-17 DIAGNOSIS — Z5181 Encounter for therapeutic drug level monitoring: Secondary | ICD-10-CM

## 2019-01-17 DIAGNOSIS — E612 Magnesium deficiency: Secondary | ICD-10-CM

## 2019-01-25 ENCOUNTER — Ambulatory Visit: Admit: 2019-01-25 | Discharge: 2019-01-26 | Payer: PRIVATE HEALTH INSURANCE

## 2019-01-25 DIAGNOSIS — Z944 Liver transplant status: Principal | ICD-10-CM

## 2019-01-25 DIAGNOSIS — E612 Magnesium deficiency: Secondary | ICD-10-CM

## 2019-01-25 DIAGNOSIS — Z5181 Encounter for therapeutic drug level monitoring: Secondary | ICD-10-CM

## 2019-01-31 ENCOUNTER — Institutional Professional Consult (permissible substitution): Admit: 2019-01-31 | Discharge: 2019-01-31 | Payer: PRIVATE HEALTH INSURANCE

## 2019-01-31 ENCOUNTER — Ambulatory Visit: Admit: 2019-01-31 | Discharge: 2019-01-31 | Payer: PRIVATE HEALTH INSURANCE

## 2019-01-31 ENCOUNTER — Ambulatory Visit
Admit: 2019-01-31 | Discharge: 2019-01-31 | Payer: PRIVATE HEALTH INSURANCE | Attending: Health Service | Primary: Health Service

## 2019-01-31 ENCOUNTER — Institutional Professional Consult (permissible substitution)
Admit: 2019-01-31 | Discharge: 2019-01-31 | Payer: PRIVATE HEALTH INSURANCE | Attending: Nutritionist | Primary: Nutritionist

## 2019-01-31 ENCOUNTER — Ambulatory Visit
Admit: 2019-01-31 | Discharge: 2019-01-31 | Payer: PRIVATE HEALTH INSURANCE | Attending: Nutritionist | Primary: Nutritionist

## 2019-01-31 DIAGNOSIS — Z944 Liver transplant status: Principal | ICD-10-CM

## 2019-01-31 MED ORDER — CHOLECALCIFEROL (VITAMIN D3) 50 MCG (2,000 UNIT) TABLET
ORAL_TABLET | Freq: Every day | ORAL | 3 refills | 0.00000 days | Status: CP
Start: 2019-01-31 — End: 2020-02-01

## 2019-01-31 MED ORDER — PREDNISONE 10 MG TABLET
ORAL_TABLET | Freq: Every day | ORAL | 2 refills | 0 days | Status: CP
Start: 2019-01-31 — End: 2019-02-02

## 2019-02-01 DIAGNOSIS — E612 Magnesium deficiency: Secondary | ICD-10-CM

## 2019-02-01 DIAGNOSIS — Z4659 Encounter for fitting and adjustment of other gastrointestinal appliance and device: Principal | ICD-10-CM

## 2019-02-01 DIAGNOSIS — Z944 Liver transplant status: Secondary | ICD-10-CM

## 2019-02-01 DIAGNOSIS — Z5181 Encounter for therapeutic drug level monitoring: Secondary | ICD-10-CM

## 2019-02-02 ENCOUNTER — Encounter: Payer: Self-pay | Admitting: Transplant Hepatology

## 2019-02-02 ENCOUNTER — Encounter
Admit: 2019-02-02 | Discharge: 2019-02-02 | Payer: PRIVATE HEALTH INSURANCE | Attending: Anesthesiology | Primary: Anesthesiology

## 2019-02-02 ENCOUNTER — Ambulatory Visit: Admit: 2019-02-02 | Discharge: 2019-02-02 | Payer: PRIVATE HEALTH INSURANCE

## 2019-02-02 DIAGNOSIS — E612 Magnesium deficiency: Secondary | ICD-10-CM

## 2019-02-02 DIAGNOSIS — Z5181 Encounter for therapeutic drug level monitoring: Secondary | ICD-10-CM

## 2019-02-02 DIAGNOSIS — Z4659 Encounter for fitting and adjustment of other gastrointestinal appliance and device: Principal | ICD-10-CM

## 2019-02-02 DIAGNOSIS — Z944 Liver transplant status: Secondary | ICD-10-CM

## 2019-02-02 MED ORDER — LEVOFLOXACIN 500 MG TABLET
ORAL_TABLET | ORAL | 0 refills | 0.00000 days | Status: CP
Start: 2019-02-02 — End: 2019-05-02

## 2019-02-02 MED ORDER — PREDNISONE 10 MG TABLET
ORAL_TABLET | Freq: Every day | ORAL | 2 refills | 0 days | Status: CP
Start: 2019-02-02 — End: 2019-02-16

## 2019-02-08 ENCOUNTER — Ambulatory Visit: Admit: 2019-02-08 | Discharge: 2019-02-09 | Payer: PRIVATE HEALTH INSURANCE

## 2019-02-08 DIAGNOSIS — Z5181 Encounter for therapeutic drug level monitoring: Secondary | ICD-10-CM

## 2019-02-08 DIAGNOSIS — E612 Magnesium deficiency: Secondary | ICD-10-CM

## 2019-02-08 DIAGNOSIS — Z944 Liver transplant status: Principal | ICD-10-CM

## 2019-02-14 ENCOUNTER — Ambulatory Visit: Admit: 2019-02-14 | Discharge: 2019-02-15 | Payer: PRIVATE HEALTH INSURANCE

## 2019-02-14 DIAGNOSIS — Z5181 Encounter for therapeutic drug level monitoring: Secondary | ICD-10-CM

## 2019-02-14 DIAGNOSIS — Z944 Liver transplant status: Principal | ICD-10-CM

## 2019-02-14 DIAGNOSIS — E612 Magnesium deficiency: Secondary | ICD-10-CM

## 2019-02-16 MED ORDER — PREDNISONE 5 MG TABLET
ORAL_TABLET | Freq: Every day | ORAL | 2 refills | 0 days | Status: CP
Start: 2019-02-16 — End: 2019-05-17

## 2019-02-28 ENCOUNTER — Ambulatory Visit: Admit: 2019-02-28 | Discharge: 2019-03-01 | Payer: PRIVATE HEALTH INSURANCE

## 2019-02-28 DIAGNOSIS — E612 Magnesium deficiency: Secondary | ICD-10-CM

## 2019-02-28 DIAGNOSIS — Z5181 Encounter for therapeutic drug level monitoring: Secondary | ICD-10-CM

## 2019-02-28 DIAGNOSIS — Z944 Liver transplant status: Principal | ICD-10-CM

## 2019-03-02 MED ORDER — PROGRAF 1 MG CAPSULE
ORAL_CAPSULE | Freq: Two times a day (BID) | ORAL | 11 refills | 0 days
Start: 2019-03-02 — End: 2019-05-02

## 2019-03-14 ENCOUNTER — Ambulatory Visit: Admit: 2019-03-14 | Discharge: 2019-03-15 | Payer: PRIVATE HEALTH INSURANCE

## 2019-03-14 DIAGNOSIS — E612 Magnesium deficiency: Secondary | ICD-10-CM

## 2019-03-14 DIAGNOSIS — Z944 Liver transplant status: Principal | ICD-10-CM

## 2019-03-14 DIAGNOSIS — Z5181 Encounter for therapeutic drug level monitoring: Secondary | ICD-10-CM

## 2019-03-29 ENCOUNTER — Ambulatory Visit: Admit: 2019-03-29 | Discharge: 2019-03-30 | Payer: PRIVATE HEALTH INSURANCE

## 2019-03-29 DIAGNOSIS — E612 Magnesium deficiency: Secondary | ICD-10-CM

## 2019-03-29 DIAGNOSIS — Z5181 Encounter for therapeutic drug level monitoring: Secondary | ICD-10-CM

## 2019-03-29 DIAGNOSIS — Z944 Liver transplant status: Principal | ICD-10-CM

## 2019-04-11 ENCOUNTER — Ambulatory Visit: Admit: 2019-04-11 | Discharge: 2019-04-12 | Payer: PRIVATE HEALTH INSURANCE

## 2019-04-11 DIAGNOSIS — E612 Magnesium deficiency: Secondary | ICD-10-CM

## 2019-04-11 DIAGNOSIS — Z5181 Encounter for therapeutic drug level monitoring: Secondary | ICD-10-CM

## 2019-04-11 DIAGNOSIS — Z944 Liver transplant status: Principal | ICD-10-CM

## 2019-04-17 IMAGING — DX DG ABDOMEN 2V
2 series · 2 of 2 positions shown · non-contrast
Comparison: CT 05/05/2017.

CLINICAL DATA: Abdominal aching, cough, fever and chills for 4
days.

EXAM:
ABDOMEN - 2 VIEW

[abdomen erect]
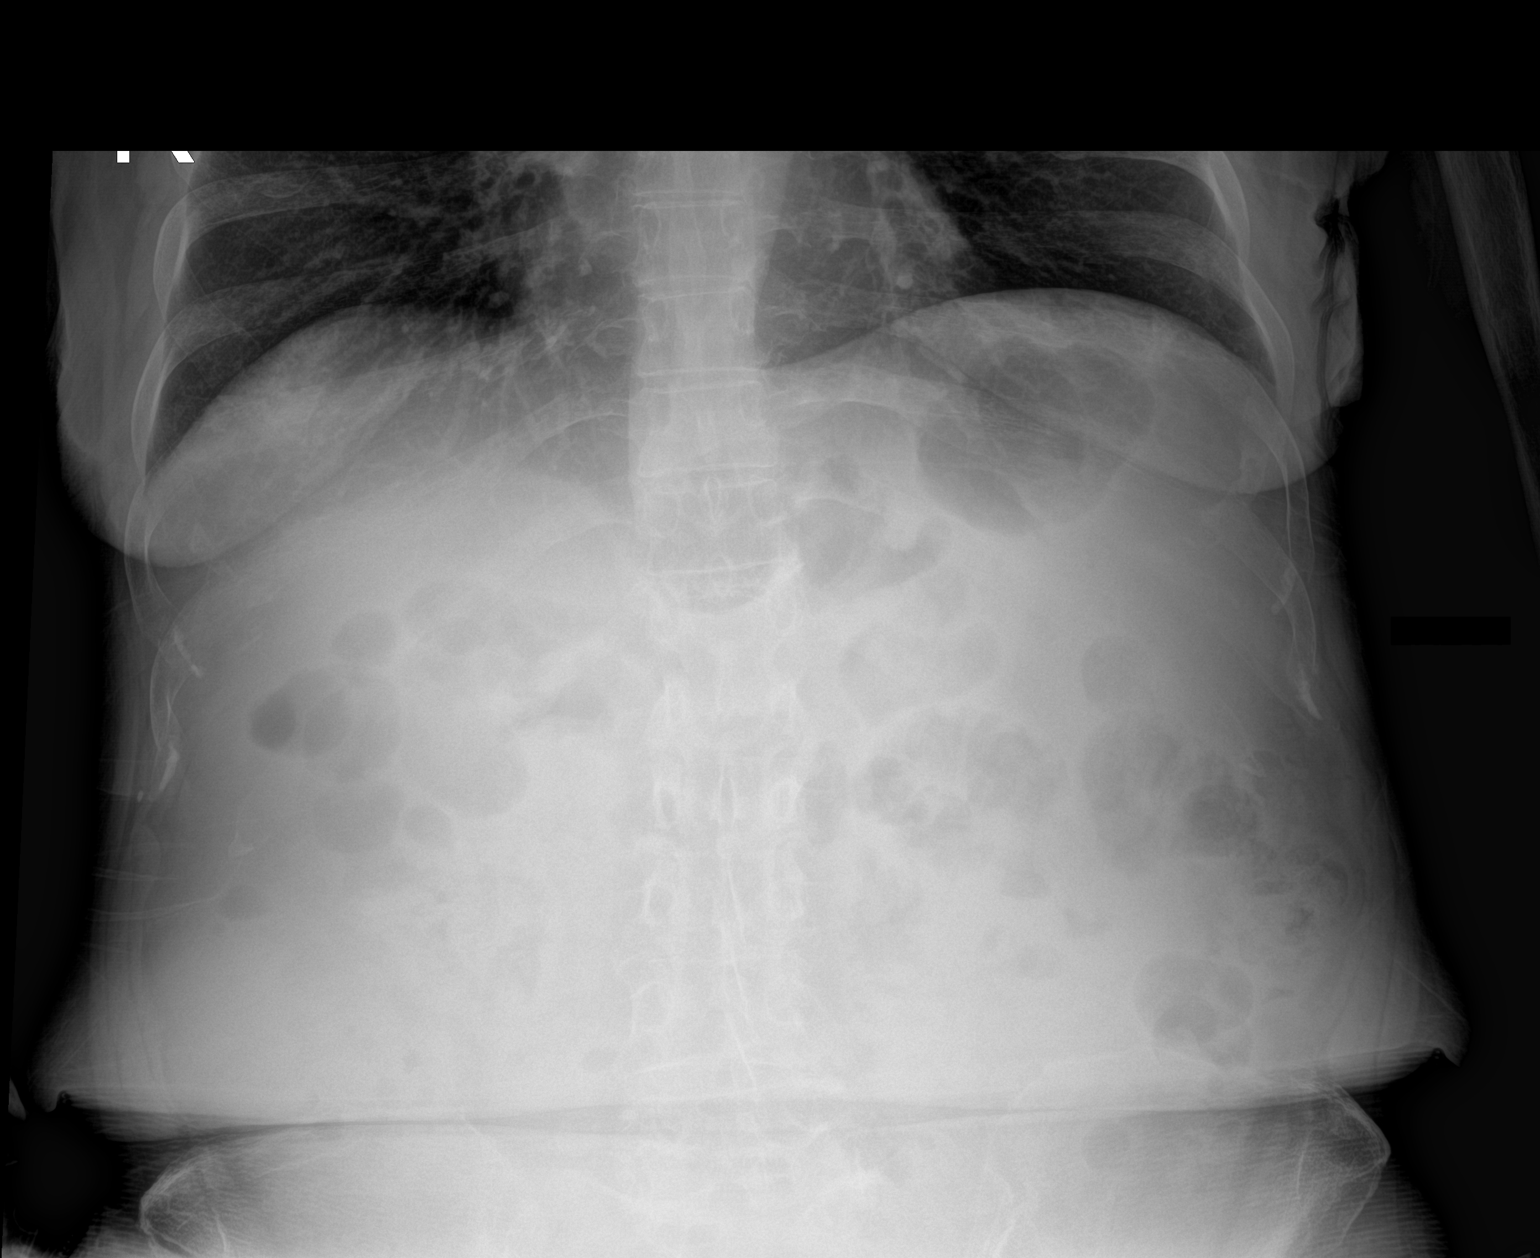

[abdomen supine]
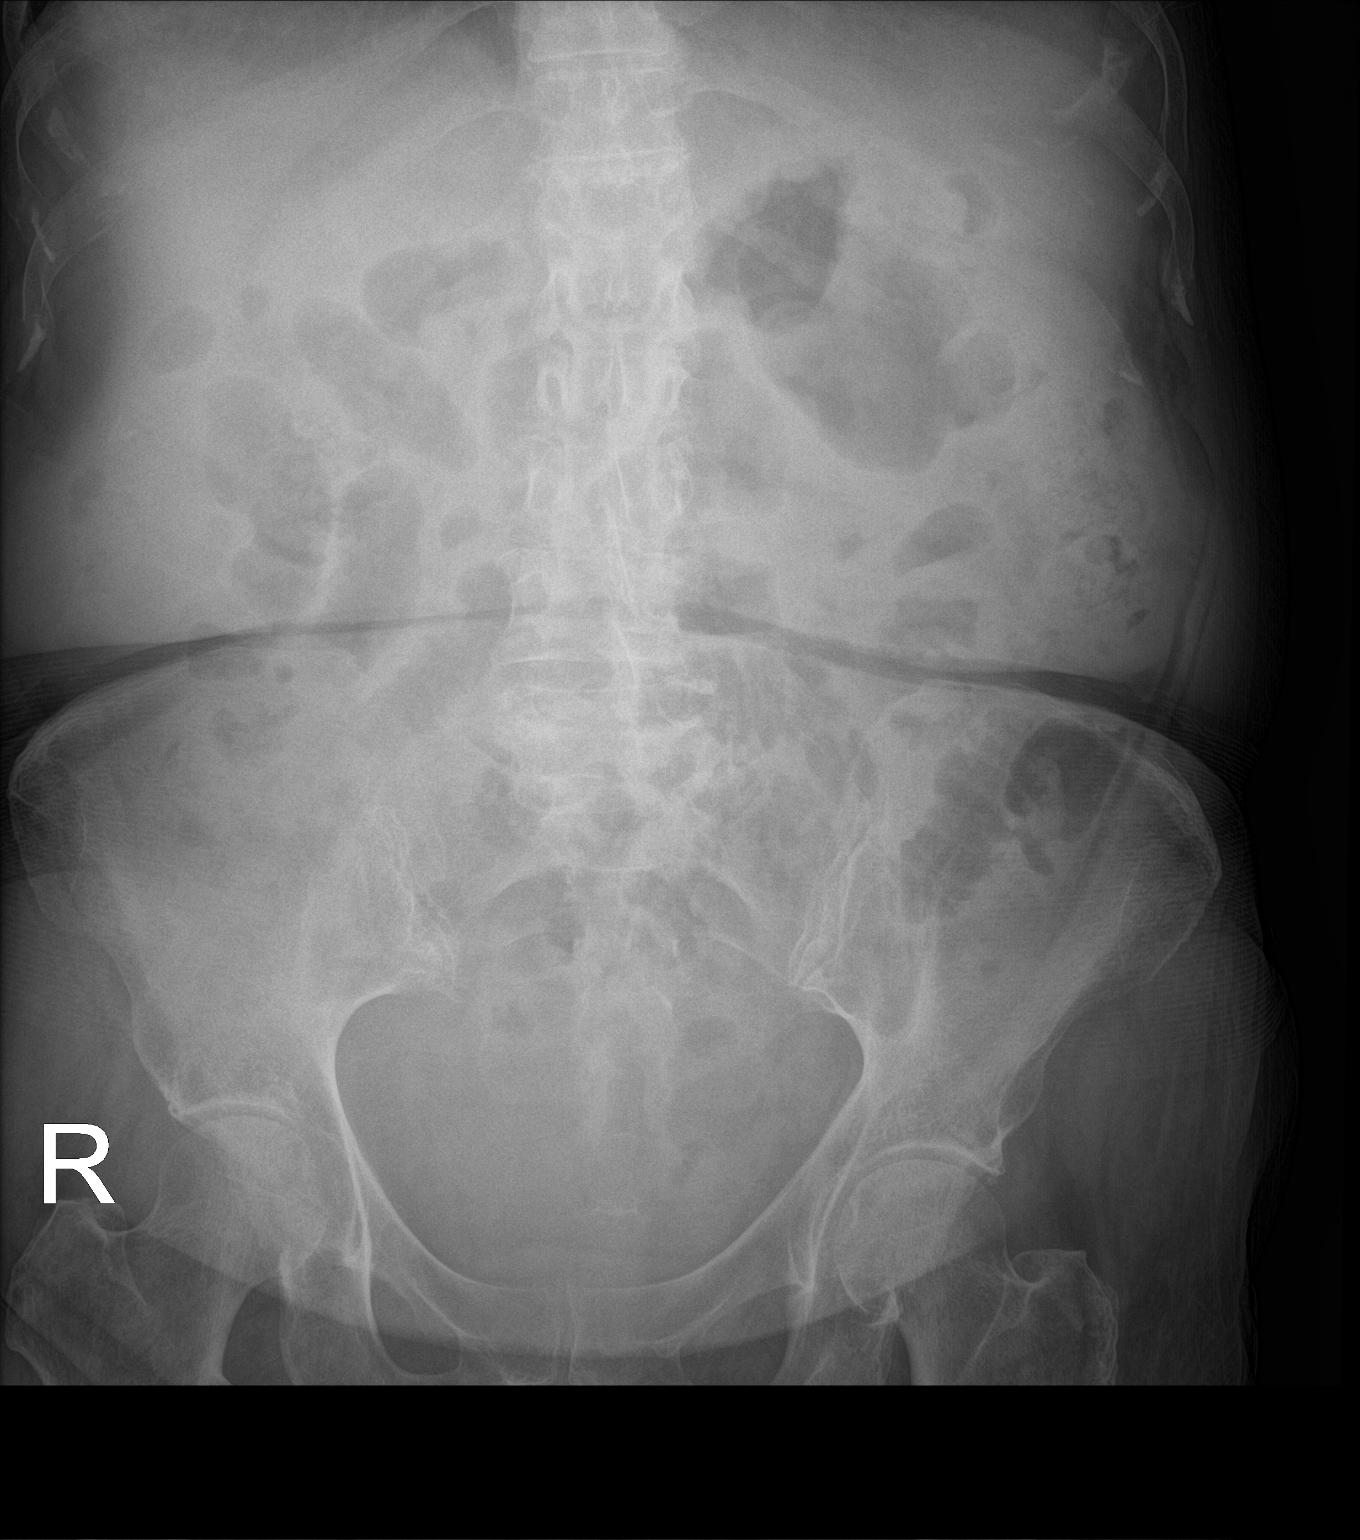

[2 of 2 positions shown; findings below may reference images not displayed]

FINDINGS: The bowel gas pattern is normal. There is no bowel wall thickening,
pneumatosis or free intraperitoneal air. No findings to suggest
progressive ascites. Mild multilevel spondylosis noted.
IMPRESSION: No acute abdominal findings.

## 2019-04-17 IMAGING — CR DG CHEST 2V
1 series · 2 of 2 positions shown · non-contrast
Comparison: Chest radiograph August 14, 2013

CLINICAL DATA: Cough, fevers and chills.

EXAM:
CHEST - 2 VIEW

[Series 1: dg chest 2 view · 0.14mm/px · 2 of 2 slices shown]
[im 1/2]
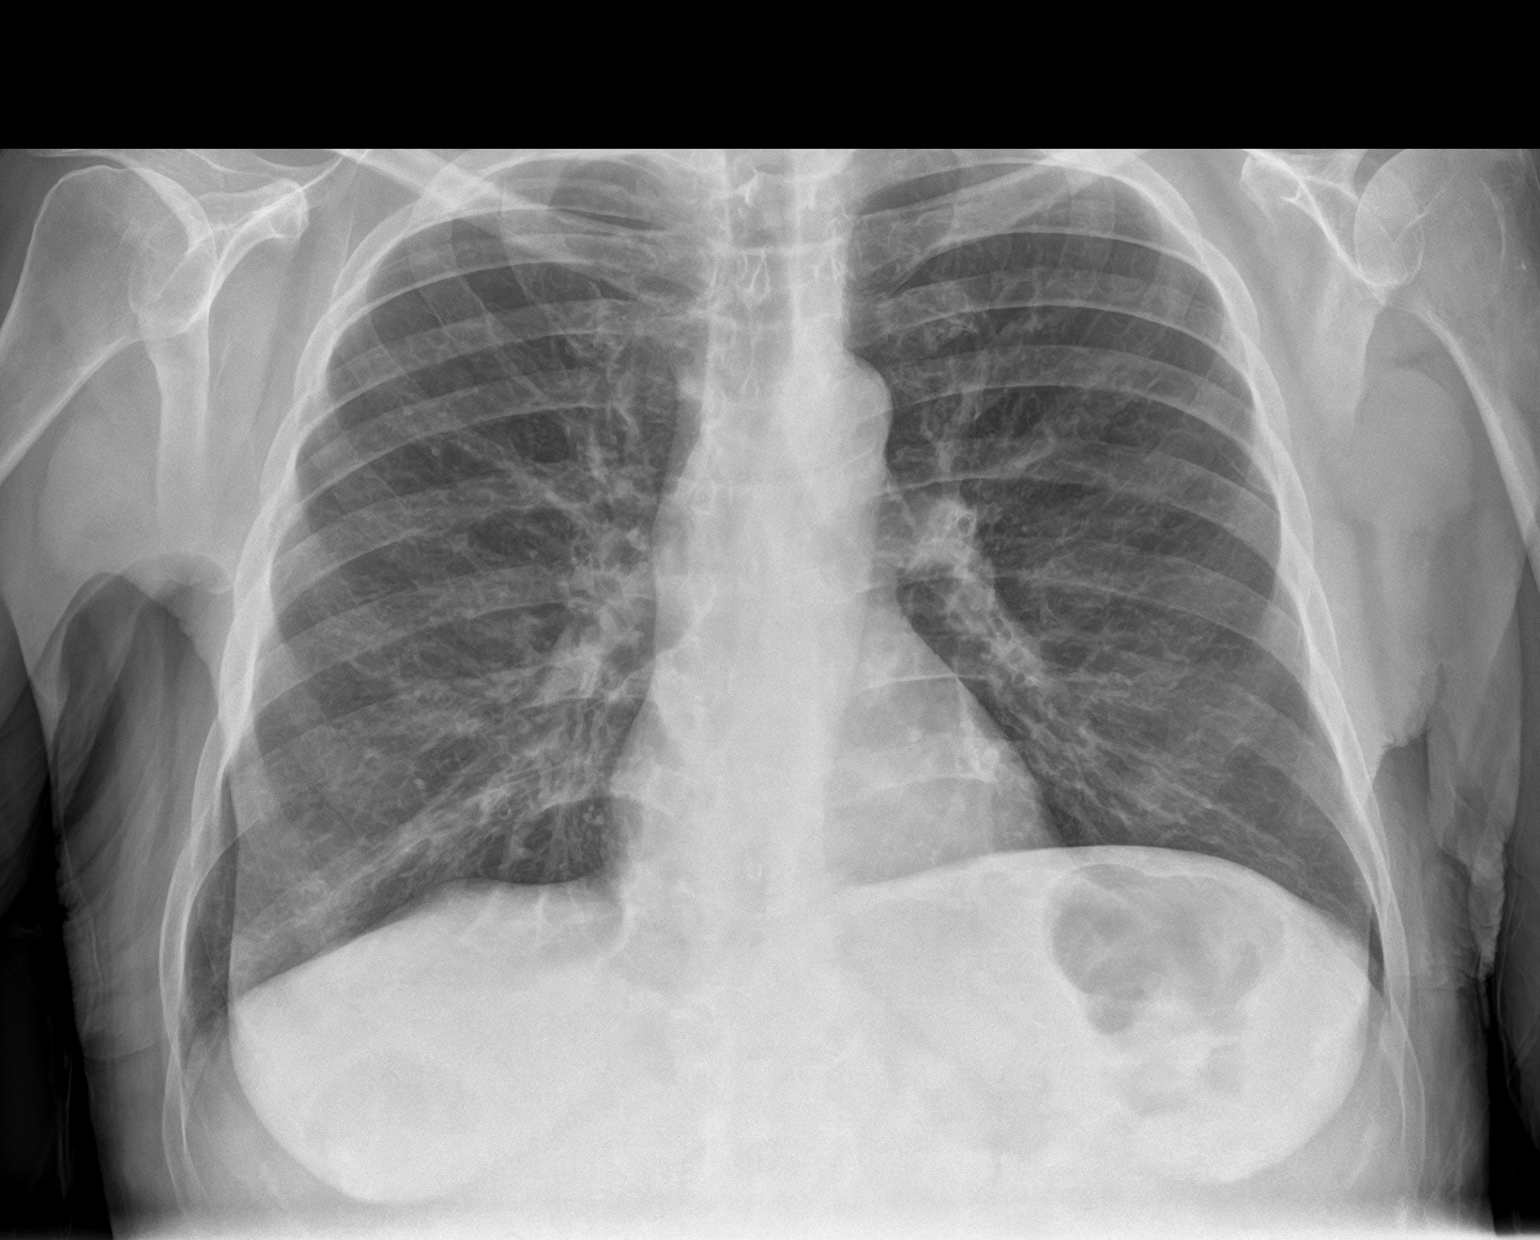
[im 2/2]
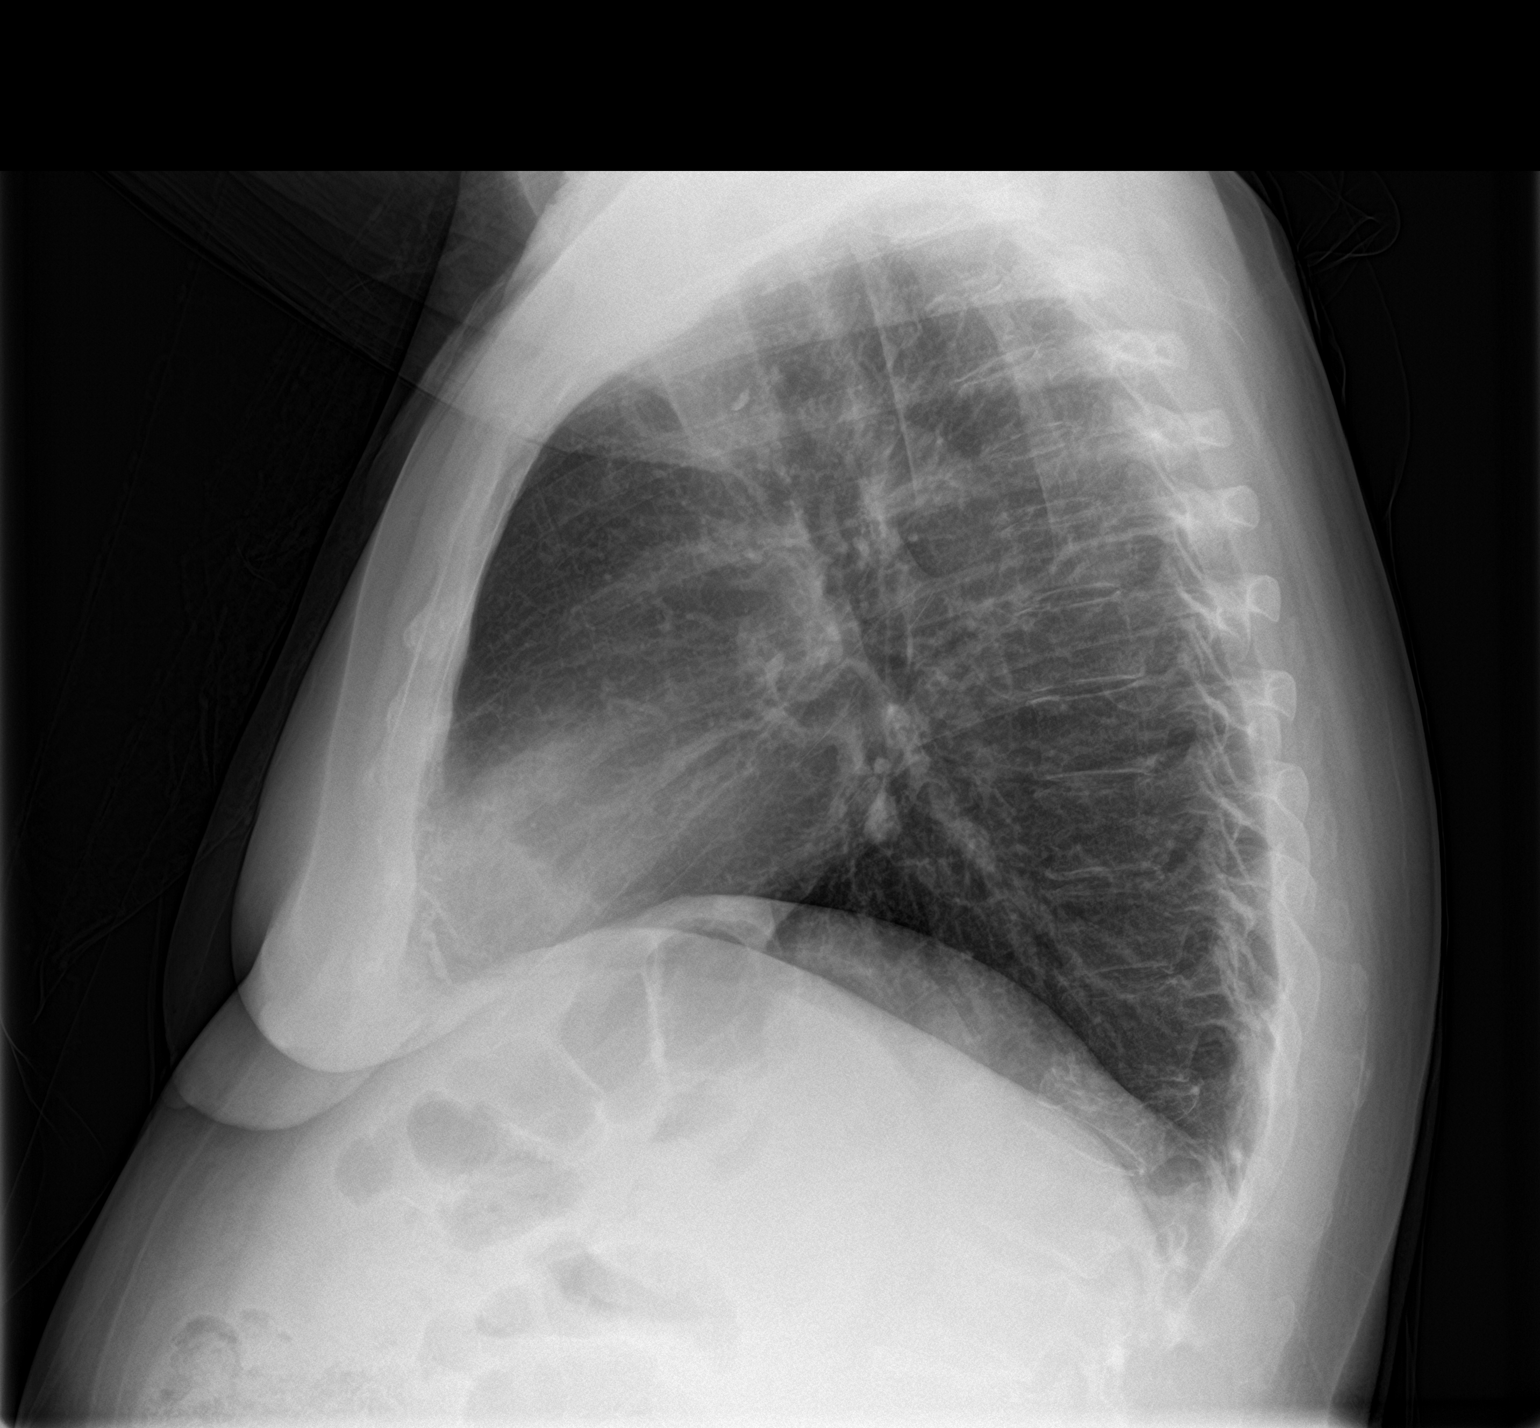

[2 of 2 positions shown; findings below may reference images not displayed]

FINDINGS: Cardiomediastinal silhouette is normal. No pleural effusions or
focal consolidations. Trachea projects midline and there is no
pneumothorax. Old severe T12 compression fracture with mild focal
kyphosis.
IMPRESSION: No active cardiopulmonary disease.

## 2019-04-19 MED ORDER — CALCIUM CARBONATE 200 MG CALCIUM (500 MG) CHEWABLE TABLET
ORAL_TABLET | Freq: Three times a day (TID) | ORAL | 11 refills | 0.00000 days
Start: 2019-04-19 — End: 2020-04-18

## 2019-04-25 ENCOUNTER — Ambulatory Visit: Admit: 2019-04-25 | Discharge: 2019-04-26 | Payer: PRIVATE HEALTH INSURANCE

## 2019-04-25 DIAGNOSIS — Z5181 Encounter for therapeutic drug level monitoring: Secondary | ICD-10-CM

## 2019-04-25 DIAGNOSIS — Z944 Liver transplant status: Principal | ICD-10-CM

## 2019-04-25 DIAGNOSIS — E612 Magnesium deficiency: Secondary | ICD-10-CM

## 2019-05-02 ENCOUNTER — Ambulatory Visit: Admit: 2019-05-02 | Discharge: 2019-05-03 | Payer: PRIVATE HEALTH INSURANCE

## 2019-05-02 ENCOUNTER — Institutional Professional Consult (permissible substitution): Admit: 2019-05-02 | Discharge: 2019-05-03 | Payer: PRIVATE HEALTH INSURANCE

## 2019-05-02 ENCOUNTER — Ambulatory Visit
Admit: 2019-05-02 | Discharge: 2019-05-03 | Payer: PRIVATE HEALTH INSURANCE | Attending: Nutritionist | Primary: Nutritionist

## 2019-05-02 DIAGNOSIS — Z9189 Other specified personal risk factors, not elsewhere classified: Secondary | ICD-10-CM

## 2019-05-02 DIAGNOSIS — Z944 Liver transplant status: Secondary | ICD-10-CM

## 2019-05-02 DIAGNOSIS — D899 Disorder involving the immune mechanism, unspecified: Secondary | ICD-10-CM

## 2019-05-02 DIAGNOSIS — Z5181 Encounter for therapeutic drug level monitoring: Secondary | ICD-10-CM

## 2019-05-02 DIAGNOSIS — Z7952 Long term (current) use of systemic steroids: Secondary | ICD-10-CM

## 2019-05-02 DIAGNOSIS — Z4789 Encounter for other orthopedic aftercare: Principal | ICD-10-CM

## 2019-05-02 DIAGNOSIS — Z0000 Encounter for general adult medical examination without abnormal findings: Principal | ICD-10-CM

## 2019-05-02 DIAGNOSIS — Z79899 Other long term (current) drug therapy: Secondary | ICD-10-CM

## 2019-05-02 DIAGNOSIS — E612 Magnesium deficiency: Secondary | ICD-10-CM

## 2019-05-02 MED ORDER — PROGRAF 1 MG CAPSULE
ORAL_CAPSULE | 11 refills | 0 days | Status: CP
Start: 2019-05-02 — End: 2019-05-26

## 2019-05-02 MED ORDER — MYCOPHENOLATE SODIUM 180 MG TABLET,DELAYED RELEASE
ORAL_TABLET | Freq: Two times a day (BID) | ORAL | 11 refills | 30.00000 days | Status: CP
Start: 2019-05-02 — End: ?

## 2019-05-09 ENCOUNTER — Ambulatory Visit: Admit: 2019-05-09 | Discharge: 2019-05-10 | Payer: PRIVATE HEALTH INSURANCE

## 2019-05-09 DIAGNOSIS — Z5181 Encounter for therapeutic drug level monitoring: Secondary | ICD-10-CM

## 2019-05-09 DIAGNOSIS — E612 Magnesium deficiency: Secondary | ICD-10-CM

## 2019-05-09 DIAGNOSIS — Z944 Liver transplant status: Principal | ICD-10-CM

## 2019-05-17 MED ORDER — PREDNISONE 5 MG TABLET
ORAL_TABLET | Freq: Every day | ORAL | 5 refills | 30 days | Status: CP
Start: 2019-05-17 — End: ?

## 2019-05-25 ENCOUNTER — Ambulatory Visit: Admit: 2019-05-25 | Discharge: 2019-05-26 | Payer: PRIVATE HEALTH INSURANCE

## 2019-05-25 DIAGNOSIS — Z944 Liver transplant status: Principal | ICD-10-CM

## 2019-05-25 DIAGNOSIS — Z5181 Encounter for therapeutic drug level monitoring: Secondary | ICD-10-CM

## 2019-05-25 DIAGNOSIS — E612 Magnesium deficiency: Secondary | ICD-10-CM

## 2019-05-26 MED ORDER — PROGRAF 1 MG CAPSULE
ORAL_CAPSULE | Freq: Two times a day (BID) | ORAL | 11 refills | 30.00000 days | Status: CP
Start: 2019-05-26 — End: 2019-06-15

## 2019-06-06 ENCOUNTER — Ambulatory Visit: Admit: 2019-06-06 | Discharge: 2019-06-07 | Payer: PRIVATE HEALTH INSURANCE

## 2019-06-06 DIAGNOSIS — Z5181 Encounter for therapeutic drug level monitoring: Secondary | ICD-10-CM

## 2019-06-06 DIAGNOSIS — E612 Magnesium deficiency: Secondary | ICD-10-CM

## 2019-06-06 DIAGNOSIS — Z944 Liver transplant status: Principal | ICD-10-CM

## 2019-06-13 ENCOUNTER — Encounter: Admit: 2019-06-13 | Discharge: 2019-06-13 | Payer: PRIVATE HEALTH INSURANCE

## 2019-06-13 ENCOUNTER — Ambulatory Visit: Admit: 2019-06-13 | Discharge: 2019-06-13 | Payer: PRIVATE HEALTH INSURANCE

## 2019-06-13 DIAGNOSIS — Z944 Liver transplant status: Principal | ICD-10-CM

## 2019-06-13 DIAGNOSIS — Z5181 Encounter for therapeutic drug level monitoring: Secondary | ICD-10-CM

## 2019-06-14 DIAGNOSIS — Z5181 Encounter for therapeutic drug level monitoring: Secondary | ICD-10-CM

## 2019-06-14 DIAGNOSIS — E612 Magnesium deficiency: Secondary | ICD-10-CM

## 2019-06-14 DIAGNOSIS — Z79899 Other long term (current) drug therapy: Secondary | ICD-10-CM

## 2019-06-14 DIAGNOSIS — Z944 Liver transplant status: Secondary | ICD-10-CM

## 2019-06-15 MED ORDER — PROGRAF 1 MG CAPSULE
ORAL_CAPSULE | 11 refills | 0 days | Status: CP
Start: 2019-06-15 — End: 2019-06-22

## 2019-06-21 ENCOUNTER — Ambulatory Visit: Admit: 2019-06-21 | Discharge: 2019-06-22 | Payer: PRIVATE HEALTH INSURANCE

## 2019-06-21 DIAGNOSIS — E612 Magnesium deficiency: Secondary | ICD-10-CM

## 2019-06-21 DIAGNOSIS — Z944 Liver transplant status: Secondary | ICD-10-CM

## 2019-06-21 DIAGNOSIS — Z5181 Encounter for therapeutic drug level monitoring: Secondary | ICD-10-CM

## 2019-06-22 DIAGNOSIS — Z944 Liver transplant status: Secondary | ICD-10-CM

## 2019-06-22 DIAGNOSIS — Z79899 Other long term (current) drug therapy: Secondary | ICD-10-CM

## 2019-06-22 MED ORDER — PROGRAF 1 MG CAPSULE
ORAL_CAPSULE | Freq: Two times a day (BID) | ORAL | 11 refills | 30.00000 days | Status: CP
Start: 2019-06-22 — End: 2019-07-05

## 2019-07-04 ENCOUNTER — Ambulatory Visit: Admit: 2019-07-04 | Discharge: 2019-07-05 | Payer: PRIVATE HEALTH INSURANCE

## 2019-07-04 DIAGNOSIS — E612 Magnesium deficiency: Secondary | ICD-10-CM

## 2019-07-04 DIAGNOSIS — Z79899 Other long term (current) drug therapy: Secondary | ICD-10-CM

## 2019-07-04 DIAGNOSIS — Z944 Liver transplant status: Secondary | ICD-10-CM

## 2019-07-04 DIAGNOSIS — Z5181 Encounter for therapeutic drug level monitoring: Secondary | ICD-10-CM

## 2019-07-05 MED ORDER — PROGRAF 1 MG CAPSULE
ORAL_CAPSULE | 11 refills | 0 days | Status: CP
Start: 2019-07-05 — End: 2019-07-11

## 2019-07-06 ENCOUNTER — Other Ambulatory Visit: Payer: Self-pay | Admitting: Infectious Diseases

## 2019-07-06 DIAGNOSIS — Z1231 Encounter for screening mammogram for malignant neoplasm of breast: Secondary | ICD-10-CM

## 2019-07-11 DIAGNOSIS — Z944 Liver transplant status: Secondary | ICD-10-CM

## 2019-07-11 DIAGNOSIS — Z79899 Other long term (current) drug therapy: Secondary | ICD-10-CM

## 2019-07-11 MED ORDER — PROGRAF 1 MG CAPSULE
ORAL_CAPSULE | 11 refills | 0 days | Status: CP
Start: 2019-07-11 — End: ?

## 2019-07-15 ENCOUNTER — Ambulatory Visit: Admit: 2019-07-15 | Discharge: 2019-07-16 | Payer: PRIVATE HEALTH INSURANCE

## 2019-07-15 DIAGNOSIS — Z5181 Encounter for therapeutic drug level monitoring: Secondary | ICD-10-CM

## 2019-07-15 DIAGNOSIS — Z944 Liver transplant status: Secondary | ICD-10-CM

## 2019-07-15 DIAGNOSIS — E612 Magnesium deficiency: Secondary | ICD-10-CM

## 2019-08-15 ENCOUNTER — Ambulatory Visit: Admit: 2019-08-15 | Discharge: 2019-08-16 | Payer: PRIVATE HEALTH INSURANCE

## 2019-08-30 ENCOUNTER — Ambulatory Visit
Admission: RE | Admit: 2019-08-30 | Discharge: 2019-08-30 | Disposition: A | Discharge status: Home / Self Care | Payer: BC Managed Care – PPO | Source: Ambulatory Visit | Attending: Infectious Diseases | Admitting: Infectious Diseases

## 2019-08-30 DIAGNOSIS — Z1231 Encounter for screening mammogram for malignant neoplasm of breast: Secondary | ICD-10-CM | POA: Diagnosis not present

## 2019-09-14 ENCOUNTER — Ambulatory Visit: Admit: 2019-09-14 | Discharge: 2019-09-15 | Payer: PRIVATE HEALTH INSURANCE

## 2019-10-17 ENCOUNTER — Ambulatory Visit: Admit: 2019-10-17 | Discharge: 2019-10-18 | Payer: PRIVATE HEALTH INSURANCE

## 2019-11-09 DIAGNOSIS — D899 Disorder involving the immune mechanism, unspecified: Principal | ICD-10-CM

## 2019-11-09 DIAGNOSIS — T8641 Liver transplant rejection: Principal | ICD-10-CM

## 2019-11-09 MED ORDER — PREDNISONE 5 MG TABLET
ORAL_TABLET | Freq: Every day | ORAL | 11 refills | 30 days | Status: CP
Start: 2019-11-09 — End: ?

## 2019-11-16 ENCOUNTER — Ambulatory Visit: Admit: 2019-11-16 | Discharge: 2019-11-17 | Payer: PRIVATE HEALTH INSURANCE

## 2019-12-12 ENCOUNTER — Ambulatory Visit: Admit: 2019-12-12 | Discharge: 2019-12-13 | Payer: PRIVATE HEALTH INSURANCE

## 2019-12-14 MED ORDER — OMEPRAZOLE 40 MG CAPSULE,DELAYED RELEASE
ORAL_CAPSULE | Freq: Every day | ORAL | 0 refills | 30 days | Status: CP
Start: 2019-12-14 — End: ?

## 2020-01-09 MED ORDER — CHOLECALCIFEROL (VITAMIN D3) 50 MCG (2,000 UNIT) TABLET
ORAL_TABLET | Freq: Every day | ORAL | 3 refills | 90 days | Status: CP
Start: 2020-01-09 — End: 2021-01-09

## 2020-01-09 MED ORDER — OMEPRAZOLE 40 MG CAPSULE,DELAYED RELEASE
ORAL_CAPSULE | Freq: Every day | ORAL | 3 refills | 90 days | Status: CP
Start: 2020-01-09 — End: 2021-01-08

## 2020-01-16 ENCOUNTER — Ambulatory Visit: Admit: 2020-01-16 | Discharge: 2020-01-17 | Payer: PRIVATE HEALTH INSURANCE

## 2020-02-13 ENCOUNTER — Ambulatory Visit: Admit: 2020-02-13 | Discharge: 2020-02-14 | Payer: PRIVATE HEALTH INSURANCE

## 2020-03-02 DIAGNOSIS — Z944 Liver transplant status: Principal | ICD-10-CM

## 2020-03-13 ENCOUNTER — Ambulatory Visit: Admit: 2020-03-13 | Discharge: 2020-03-14 | Payer: PRIVATE HEALTH INSURANCE

## 2020-04-12 ENCOUNTER — Ambulatory Visit: Admit: 2020-04-12 | Discharge: 2020-04-13 | Payer: PRIVATE HEALTH INSURANCE

## 2020-04-12 DIAGNOSIS — Z944 Liver transplant status: Principal | ICD-10-CM

## 2020-04-12 DIAGNOSIS — Z79899 Other long term (current) drug therapy: Principal | ICD-10-CM

## 2020-04-12 MED ORDER — PROGRAF 1 MG CAPSULE
ORAL_CAPSULE | 11 refills | 0 days | Status: CP
Start: 2020-04-12 — End: ?

## 2020-04-12 MED ORDER — MYCOPHENOLATE SODIUM 180 MG TABLET,DELAYED RELEASE
ORAL_TABLET | Freq: Two times a day (BID) | ORAL | 11 refills | 30 days | Status: CP
Start: 2020-04-12 — End: ?

## 2020-05-18 ENCOUNTER — Ambulatory Visit: Admit: 2020-05-18 | Discharge: 2020-05-19 | Payer: PRIVATE HEALTH INSURANCE

## 2020-05-18 DIAGNOSIS — Z944 Liver transplant status: Principal | ICD-10-CM

## 2020-06-19 ENCOUNTER — Ambulatory Visit: Admit: 2020-06-19 | Discharge: 2020-06-20 | Payer: PRIVATE HEALTH INSURANCE

## 2020-06-19 DIAGNOSIS — Z944 Liver transplant status: Principal | ICD-10-CM

## 2020-06-21 DIAGNOSIS — Z944 Liver transplant status: Principal | ICD-10-CM

## 2020-06-21 DIAGNOSIS — Z5181 Encounter for therapeutic drug level monitoring: Principal | ICD-10-CM

## 2020-06-21 DIAGNOSIS — E612 Magnesium deficiency: Principal | ICD-10-CM

## 2020-06-25 DIAGNOSIS — Z5181 Encounter for therapeutic drug level monitoring: Principal | ICD-10-CM

## 2020-06-25 DIAGNOSIS — E612 Magnesium deficiency: Principal | ICD-10-CM

## 2020-06-25 DIAGNOSIS — Z944 Liver transplant status: Principal | ICD-10-CM

## 2020-07-02 DIAGNOSIS — Z5181 Encounter for therapeutic drug level monitoring: Principal | ICD-10-CM

## 2020-07-02 DIAGNOSIS — Z944 Liver transplant status: Principal | ICD-10-CM

## 2020-07-02 DIAGNOSIS — E612 Magnesium deficiency: Principal | ICD-10-CM

## 2020-07-09 DIAGNOSIS — E612 Magnesium deficiency: Principal | ICD-10-CM

## 2020-07-09 DIAGNOSIS — Z5181 Encounter for therapeutic drug level monitoring: Principal | ICD-10-CM

## 2020-07-09 DIAGNOSIS — Z944 Liver transplant status: Principal | ICD-10-CM

## 2020-07-16 ENCOUNTER — Ambulatory Visit: Admit: 2020-07-16 | Discharge: 2020-07-17 | Payer: PRIVATE HEALTH INSURANCE

## 2020-07-16 DIAGNOSIS — Z5181 Encounter for therapeutic drug level monitoring: Principal | ICD-10-CM

## 2020-07-16 DIAGNOSIS — Z944 Liver transplant status: Principal | ICD-10-CM

## 2020-07-16 DIAGNOSIS — E612 Magnesium deficiency: Principal | ICD-10-CM

## 2020-07-23 DIAGNOSIS — E612 Magnesium deficiency: Principal | ICD-10-CM

## 2020-07-23 DIAGNOSIS — Z5181 Encounter for therapeutic drug level monitoring: Principal | ICD-10-CM

## 2020-07-23 DIAGNOSIS — Z944 Liver transplant status: Principal | ICD-10-CM

## 2020-07-30 DIAGNOSIS — E612 Magnesium deficiency: Principal | ICD-10-CM

## 2020-07-30 DIAGNOSIS — Z944 Liver transplant status: Principal | ICD-10-CM

## 2020-07-30 DIAGNOSIS — Z5181 Encounter for therapeutic drug level monitoring: Principal | ICD-10-CM

## 2020-08-06 DIAGNOSIS — E612 Magnesium deficiency: Principal | ICD-10-CM

## 2020-08-06 DIAGNOSIS — Z944 Liver transplant status: Principal | ICD-10-CM

## 2020-08-06 DIAGNOSIS — Z5181 Encounter for therapeutic drug level monitoring: Principal | ICD-10-CM

## 2020-08-13 DIAGNOSIS — Z5181 Encounter for therapeutic drug level monitoring: Principal | ICD-10-CM

## 2020-08-13 DIAGNOSIS — E612 Magnesium deficiency: Principal | ICD-10-CM

## 2020-08-13 DIAGNOSIS — Z944 Liver transplant status: Principal | ICD-10-CM

## 2020-08-15 ENCOUNTER — Ambulatory Visit: Admit: 2020-08-15 | Discharge: 2020-08-16 | Payer: PRIVATE HEALTH INSURANCE

## 2020-08-15 DIAGNOSIS — Z5181 Encounter for therapeutic drug level monitoring: Principal | ICD-10-CM

## 2020-08-15 DIAGNOSIS — Z944 Liver transplant status: Principal | ICD-10-CM

## 2020-08-15 DIAGNOSIS — E612 Magnesium deficiency: Principal | ICD-10-CM

## 2020-08-16 ENCOUNTER — Encounter: Payer: BC Managed Care – PPO | Admitting: Dermatology

## 2020-08-20 DIAGNOSIS — Z944 Liver transplant status: Principal | ICD-10-CM

## 2020-08-20 DIAGNOSIS — E612 Magnesium deficiency: Principal | ICD-10-CM

## 2020-08-20 DIAGNOSIS — Z5181 Encounter for therapeutic drug level monitoring: Principal | ICD-10-CM

## 2020-08-27 DIAGNOSIS — E612 Magnesium deficiency: Principal | ICD-10-CM

## 2020-08-27 DIAGNOSIS — Z944 Liver transplant status: Principal | ICD-10-CM

## 2020-08-27 DIAGNOSIS — Z5181 Encounter for therapeutic drug level monitoring: Principal | ICD-10-CM

## 2020-09-03 ENCOUNTER — Other Ambulatory Visit: Payer: Self-pay | Admitting: Infectious Diseases

## 2020-09-03 DIAGNOSIS — Z1231 Encounter for screening mammogram for malignant neoplasm of breast: Secondary | ICD-10-CM

## 2020-09-03 DIAGNOSIS — E612 Magnesium deficiency: Principal | ICD-10-CM

## 2020-09-03 DIAGNOSIS — Z944 Liver transplant status: Principal | ICD-10-CM

## 2020-09-03 DIAGNOSIS — Z5181 Encounter for therapeutic drug level monitoring: Principal | ICD-10-CM

## 2020-09-05 ENCOUNTER — Ambulatory Visit
Admission: RE | Admit: 2020-09-05 | Discharge: 2020-09-05 | Disposition: A | Discharge status: Home / Self Care | Payer: BC Managed Care – PPO | Source: Ambulatory Visit | Attending: Infectious Diseases | Admitting: Infectious Diseases

## 2020-09-05 ENCOUNTER — Other Ambulatory Visit: Payer: Self-pay

## 2020-09-05 DIAGNOSIS — Z1231 Encounter for screening mammogram for malignant neoplasm of breast: Secondary | ICD-10-CM | POA: Diagnosis not present

## 2020-09-10 DIAGNOSIS — Z944 Liver transplant status: Principal | ICD-10-CM

## 2020-09-10 DIAGNOSIS — E612 Magnesium deficiency: Principal | ICD-10-CM

## 2020-09-10 DIAGNOSIS — Z5181 Encounter for therapeutic drug level monitoring: Principal | ICD-10-CM

## 2020-09-17 ENCOUNTER — Ambulatory Visit: Admit: 2020-09-17 | Discharge: 2020-09-18 | Payer: PRIVATE HEALTH INSURANCE

## 2020-09-17 DIAGNOSIS — E612 Magnesium deficiency: Principal | ICD-10-CM

## 2020-09-17 DIAGNOSIS — Z5181 Encounter for therapeutic drug level monitoring: Principal | ICD-10-CM

## 2020-09-17 DIAGNOSIS — Z944 Liver transplant status: Principal | ICD-10-CM

## 2020-09-24 DIAGNOSIS — Z5181 Encounter for therapeutic drug level monitoring: Principal | ICD-10-CM

## 2020-09-24 DIAGNOSIS — Z944 Liver transplant status: Principal | ICD-10-CM

## 2020-09-24 DIAGNOSIS — E612 Magnesium deficiency: Principal | ICD-10-CM

## 2020-10-01 DIAGNOSIS — Z5181 Encounter for therapeutic drug level monitoring: Principal | ICD-10-CM

## 2020-10-01 DIAGNOSIS — Z944 Liver transplant status: Principal | ICD-10-CM

## 2020-10-01 DIAGNOSIS — E612 Magnesium deficiency: Principal | ICD-10-CM

## 2020-10-03 DIAGNOSIS — E559 Vitamin D deficiency, unspecified: Principal | ICD-10-CM

## 2020-10-03 MED ORDER — VITAMIN D3 50 MCG (2,000 UNIT) CAPSULE
ORAL_CAPSULE | 0 refills | 0 days
Start: 2020-10-03 — End: ?

## 2020-10-04 MED ORDER — OMEPRAZOLE 40 MG CAPSULE,DELAYED RELEASE
ORAL_CAPSULE | 0 refills | 0 days
Start: 2020-10-04 — End: ?

## 2020-10-04 MED ORDER — VITAMIN D3 50 MCG (2,000 UNIT) CAPSULE
ORAL_CAPSULE | 0 refills | 0 days
Start: 2020-10-04 — End: ?

## 2020-10-08 DIAGNOSIS — Z5181 Encounter for therapeutic drug level monitoring: Principal | ICD-10-CM

## 2020-10-08 DIAGNOSIS — Z944 Liver transplant status: Principal | ICD-10-CM

## 2020-10-08 DIAGNOSIS — E612 Magnesium deficiency: Principal | ICD-10-CM

## 2020-10-15 DIAGNOSIS — E612 Magnesium deficiency: Principal | ICD-10-CM

## 2020-10-15 DIAGNOSIS — Z5181 Encounter for therapeutic drug level monitoring: Principal | ICD-10-CM

## 2020-10-15 DIAGNOSIS — Z944 Liver transplant status: Principal | ICD-10-CM

## 2020-10-22 ENCOUNTER — Ambulatory Visit: Admit: 2020-10-22 | Discharge: 2020-10-23

## 2020-10-22 DIAGNOSIS — Z944 Liver transplant status: Principal | ICD-10-CM

## 2020-10-22 DIAGNOSIS — Z5181 Encounter for therapeutic drug level monitoring: Principal | ICD-10-CM

## 2020-10-22 DIAGNOSIS — E612 Magnesium deficiency: Principal | ICD-10-CM

## 2020-10-25 DIAGNOSIS — T8641 Liver transplant rejection: Principal | ICD-10-CM

## 2020-10-25 DIAGNOSIS — D849 Immunodeficiency, unspecified: Principal | ICD-10-CM

## 2020-10-25 MED ORDER — PREDNISONE 5 MG TABLET
ORAL_TABLET | Freq: Every day | ORAL | 11 refills | 30 days | Status: CP
Start: 2020-10-25 — End: ?

## 2020-10-26 DIAGNOSIS — Z944 Liver transplant status: Principal | ICD-10-CM

## 2020-10-26 DIAGNOSIS — Z79899 Other long term (current) drug therapy: Principal | ICD-10-CM

## 2020-10-26 MED ORDER — PROGRAF 1 MG CAPSULE
ORAL_CAPSULE | Freq: Two times a day (BID) | ORAL | 11 refills | 30.00000 days | Status: CP
Start: 2020-10-26 — End: 2020-11-07

## 2020-10-29 DIAGNOSIS — Z944 Liver transplant status: Principal | ICD-10-CM

## 2020-10-29 DIAGNOSIS — Z5181 Encounter for therapeutic drug level monitoring: Principal | ICD-10-CM

## 2020-10-29 DIAGNOSIS — E612 Magnesium deficiency: Principal | ICD-10-CM

## 2020-11-05 DIAGNOSIS — Z5181 Encounter for therapeutic drug level monitoring: Principal | ICD-10-CM

## 2020-11-05 DIAGNOSIS — Z944 Liver transplant status: Principal | ICD-10-CM

## 2020-11-05 DIAGNOSIS — E612 Magnesium deficiency: Principal | ICD-10-CM

## 2020-11-06 ENCOUNTER — Ambulatory Visit: Admit: 2020-11-06 | Discharge: 2020-11-07 | Payer: PRIVATE HEALTH INSURANCE

## 2020-11-06 DIAGNOSIS — Z5181 Encounter for therapeutic drug level monitoring: Principal | ICD-10-CM

## 2020-11-06 DIAGNOSIS — E612 Magnesium deficiency: Principal | ICD-10-CM

## 2020-11-06 DIAGNOSIS — Z944 Liver transplant status: Principal | ICD-10-CM

## 2020-11-07 DIAGNOSIS — Z79899 Other long term (current) drug therapy: Principal | ICD-10-CM

## 2020-11-07 DIAGNOSIS — Z944 Liver transplant status: Principal | ICD-10-CM

## 2020-11-07 MED ORDER — PROGRAF 1 MG CAPSULE
ORAL_CAPSULE | Freq: Two times a day (BID) | ORAL | 11 refills | 30 days | Status: CP
Start: 2020-11-07 — End: ?

## 2020-11-12 DIAGNOSIS — E612 Magnesium deficiency: Principal | ICD-10-CM

## 2020-11-12 DIAGNOSIS — Z5181 Encounter for therapeutic drug level monitoring: Principal | ICD-10-CM

## 2020-11-12 DIAGNOSIS — Z944 Liver transplant status: Principal | ICD-10-CM

## 2020-11-13 ENCOUNTER — Ambulatory Visit: Admit: 2020-11-13 | Discharge: 2020-11-14 | Payer: PRIVATE HEALTH INSURANCE

## 2020-11-13 DIAGNOSIS — Z944 Liver transplant status: Principal | ICD-10-CM

## 2020-11-13 DIAGNOSIS — Z5181 Encounter for therapeutic drug level monitoring: Principal | ICD-10-CM

## 2020-11-13 DIAGNOSIS — E612 Magnesium deficiency: Principal | ICD-10-CM

## 2020-11-19 DIAGNOSIS — Z944 Liver transplant status: Principal | ICD-10-CM

## 2020-11-19 DIAGNOSIS — E612 Magnesium deficiency: Principal | ICD-10-CM

## 2020-11-19 DIAGNOSIS — Z5181 Encounter for therapeutic drug level monitoring: Principal | ICD-10-CM

## 2020-11-21 ENCOUNTER — Ambulatory Visit: Admit: 2020-11-21 | Discharge: 2020-11-22 | Payer: PRIVATE HEALTH INSURANCE

## 2020-11-21 DIAGNOSIS — Z944 Liver transplant status: Principal | ICD-10-CM

## 2020-11-21 DIAGNOSIS — E612 Magnesium deficiency: Principal | ICD-10-CM

## 2020-11-21 DIAGNOSIS — Z5181 Encounter for therapeutic drug level monitoring: Principal | ICD-10-CM

## 2020-11-26 DIAGNOSIS — E612 Magnesium deficiency: Principal | ICD-10-CM

## 2020-11-26 DIAGNOSIS — Z944 Liver transplant status: Principal | ICD-10-CM

## 2020-11-26 DIAGNOSIS — Z5181 Encounter for therapeutic drug level monitoring: Principal | ICD-10-CM

## 2020-12-03 DIAGNOSIS — Z944 Liver transplant status: Principal | ICD-10-CM

## 2020-12-03 DIAGNOSIS — Z5181 Encounter for therapeutic drug level monitoring: Principal | ICD-10-CM

## 2020-12-03 DIAGNOSIS — E612 Magnesium deficiency: Principal | ICD-10-CM

## 2020-12-20 ENCOUNTER — Other Ambulatory Visit: Payer: Self-pay

## 2020-12-20 ENCOUNTER — Ambulatory Visit: Payer: BC Managed Care – PPO | Admitting: Dermatology

## 2020-12-20 DIAGNOSIS — Z1283 Encounter for screening for malignant neoplasm of skin: Secondary | ICD-10-CM

## 2020-12-20 DIAGNOSIS — L570 Actinic keratosis: Secondary | ICD-10-CM

## 2020-12-20 DIAGNOSIS — D229 Melanocytic nevi, unspecified: Secondary | ICD-10-CM

## 2020-12-20 DIAGNOSIS — L814 Other melanin hyperpigmentation: Secondary | ICD-10-CM

## 2020-12-20 DIAGNOSIS — B079 Viral wart, unspecified: Secondary | ICD-10-CM

## 2020-12-20 DIAGNOSIS — L578 Other skin changes due to chronic exposure to nonionizing radiation: Secondary | ICD-10-CM | POA: Diagnosis not present

## 2020-12-20 DIAGNOSIS — D1800 Hemangioma unspecified site: Secondary | ICD-10-CM

## 2020-12-20 DIAGNOSIS — L905 Scar conditions and fibrosis of skin: Secondary | ICD-10-CM | POA: Diagnosis not present

## 2020-12-20 DIAGNOSIS — L821 Other seborrheic keratosis: Secondary | ICD-10-CM

## 2020-12-20 NOTE — Patient Instructions (Addendum)
Melanoma ABCDEs  Melanoma is the most dangerous type of skin cancer, and is the leading cause of death from skin disease.  You are more likely to develop melanoma if you:  Have light-colored skin, light-colored eyes, or red or blond hair  Spend a lot of time in the sun  Tan regularly, either outdoors or in a tanning bed  Have had blistering sunburns, especially during childhood  Have a close family member who has had a melanoma  Have atypical moles or large birthmarks  Early detection of melanoma is key since treatment is typically straightforward and cure rates are extremely high if we catch it early.   The first sign of melanoma is often a change in a mole or a new dark spot.  The ABCDE system is a way of remembering the signs of melanoma.  A for asymmetry:  The two halves do not match. B for border:  The edges of the growth are irregular. C for color:  A mixture of colors are present instead of an even brown color. D for diameter:  Melanomas are usually (but not always) greater than 48mm - the size of a pencil eraser. E for evolution:  The spot keeps changing in size, shape, and color.  Please check your skin once per month between visits. You can use a small mirror in front and a large mirror behind you to keep an eye on the back side or your body.   If you see any new or changing lesions before your next follow-up, please call to schedule a visit.  Please continue daily skin protection including broad spectrum sunscreen SPF 30+ to sun-exposed areas, reapplying every 2 hours as needed when you're outdoors.   Staying in the shade or wearing long sleeves, sun glasses (UVA+UVB protection) and wide brim hats (4-inch brim around the entire circumference of the hat) are also recommended for sun protection.   Recommend taking Heliocare sun protection supplement daily in sunny weather for additional sun protection. For maximum protection on the sunniest days, you can take up to 2  capsules of regular Heliocare OR take 1 capsule of Heliocare Ultra. For prolonged exposure (such as a full day in the sun), you can repeat your dose of the supplement 4 hours after your first dose. Heliocare can be purchased at [facility] or at [email or web address].   Cryotherapy Aftercare  . Wash gently with soap and water everyday.   Apply Vaseline and Band-Aid daily until healed.  Recommend Serica moisturizing scar formula cream every night or [employer] brand or Mederma silicone scar sheet every night for the first year after a scar appears to help with scar remodeling if desired. Scars remodel on their own for a full year.

## 2020-12-20 NOTE — Progress Notes (Signed)
Follow-Up Visit   Subjective  Barbara Moss is a 65 y.o. female who presents for the following: tbse (Patient here today for tbse. Patient reports a scaly spot that she noticed 6 months ago on left lower leg. Patient reports area on scalp that was removed some times scales up but it doesn't bother her. ).  Patient also reports a place on right side of jaw she would like checked.  Patient here for full body skin exam and skin cancer screening.  The following portions of the chart were reviewed this encounter and updated as appropriate:  Tobacco  Allergies  Meds  Problems  Med Hx  Surg Hx  Fam Hx      Objective  Well appearing patient in no apparent distress; mood and affect are within normal limits.  A full examination was performed including scalp, head, eyes, ears, nose, lips, neck, chest, axillae, abdomen, back, buttocks, bilateral upper extremities, bilateral lower extremities, hands, feet, fingers, toes, fingernails, and toenails. All findings within normal limits unless otherwise noted below.  Objective  right parietal scalp: Dyspigmented smooth macule or patch.   Objective  left pretibia x1, left shoulder x 1, right medial calf x 1, right forearm x 1 (3): Erythematous thin papules/macules with gritty scale.   Objective  Right Thigh - Anterior x 1: Verrucous papule  Assessment & Plan  Scar right parietal scalp  S/p biopsy showing BENIGN EPIDERMAL HYPERPLASIA WITH HYPERKERATOSIS Benign. Observe.  Actinic keratosis (4) left shoulder x 1, right medial calf x 1, right forearm x 1 (3); left pretibia x1  Within porokeratosis at left pretibia  Hypertrophic - right forearm  Prior to procedure, discussed risks of blister formation, small wound, skin dyspigmentation, or rare scar following cryotherapy.    Destruction of lesion - left pretibia x1, left shoulder x 1, right medial calf x 1, right forearm x 1  Destruction method: cryotherapy   Informed consent:  discussed and consent obtained   Lesion destroyed using liquid nitrogen: Yes   Cryotherapy cycles:  2 Outcome: patient tolerated procedure well with no complications   Post-procedure details: wound care instructions given    Verruca Right Thigh - Anterior x 1  Prior to procedure, discussed risks of blister formation, small wound, skin dyspigmentation, or rare scar following cryotherapy.    Destruction of lesion - Right Thigh - Anterior x 1  Destruction method: cryotherapy   Informed consent: discussed and consent obtained   Lesion destroyed using liquid nitrogen: Yes   Cryotherapy cycles:  2 Outcome: patient tolerated procedure well with no complications   Post-procedure details: wound care instructions given     Lentigines - Scattered tan macules - Due to sun exposure - Benign-appering, observe - Recommend daily broad spectrum sunscreen SPF 30+ to sun-exposed areas, reapply every 2 hours as needed. - Call for any changes  Seborrheic Keratoses Left jaw - tan patch, left side of face,  - Stuck-on, waxy, tan-brown papules and plaques  - Discussed benign etiology and prognosis. - Observe - Call for any changes  Melanocytic Nevi - Tan-brown and/or pink-flesh-colored symmetric macules and papules - Benign appearing on exam today - Observation - Call clinic for new or changing moles - Recommend daily use of broad spectrum spf 30+ sunscreen to sun-exposed areas.   Hemangiomas - Red papules - Discussed benign nature - Observe - Call for any changes  Actinic Damage - Chronic, secondary to cumulative UV/sun exposure - diffuse scaly erythematous macules with underlying dyspigmentation - Recommend daily  broad spectrum sunscreen SPF 30+ to sun-exposed areas, reapply every 2 hours as needed.  - Call for new or changing lesions.  Skin cancer screening performed today.  Return in about 3 months (around 03/22/2021) for  ak followup,  1 year tbse .  I, Ruthell Rummage, CMA, am  acting as scribe for Forest Gleason, MD.  Documentation: I have reviewed the above documentation for accuracy and completeness, and I agree with the above.  Forest Gleason, MD

## 2020-12-24 ENCOUNTER — Encounter: Payer: Self-pay | Admitting: Dermatology

## 2021-01-07 NOTE — Progress Notes (Signed)
01/08/2021 2:03 PM   [place] 01-17-1956 [id number]  Referring provider: Leonel Ramsay, MD [place],  [place] [address]  Chief Complaint  Patient presents with  . Urinary Incontinence   Urological history: 1. Incontinence -contributing factors of age, vaginal atrophy and cystocele   2. Vaginal atrophy -Negative mammogram in November 2021  3. Sleep apnea -untreated   HPI: Barbara Moss is a 65 y.o. female who presents today as a referral by Dr. Ola Spurr for incontinence.  I had seen her in 2019 for similar complaints.  At that time, it was the nighttime incontinence that was most bothersome for her.   She was advised to speak to her primary care physician regarding a sleep study for further evaluation.  Patient states that she has had urinary incontinence for several years.  Patient has incontinence with urge and stress.   She is experiencing 3 or more incontinent episodes during the day.  She is experiencing several incontinent episodes during the night.  She wakes up with her pad soaking wet.    Her incontinence volume is large.   She is wearing 6 thick high absorbant pads/depends daily.    She is having associated urinary frequency x 8 or more, strong urgency and nocturia x 3 or more.  She had a bladder tacking several years ago after the birth of her son.   She is post menopausal.   She admits to constipation.    She is drinking a lot of water daily and one soda a week.  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  UA negative.  PVR 83 mL  PMH: Past Medical History:  Diagnosis Date  . Abnormal liver enzymes 03/19/2015  . Anemia   . Arthritis   . Autoimmune hepatitis ([place]) 04/23/2015  . 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 GERD (gastroesophageal reflux disease)   . Hand discomfort 12/20/2014  . Hearing decreased   . History of MRSA infection    left thigh  . Hypothyroidism   . Iron deficiency  anemia 03/19/2015  . Sleep apnea 03/19/2015  . Spontaneous bacterial peritonitis [facility])     Surgical History: Past Surgical History:  Procedure Laterality Date  . ABDOMINAL HYSTERECTOMY    . Bladder tack    . COLONOSCOPY WITH PROPOFOL N/A 04/24/2016   Procedure: COLONOSCOPY WITH PROPOFOL;  Surgeon: Manya Silvas, MD;  Location: [facility] ENDOSCOPY;  Service: Endoscopy;  Laterality: N/A;  . INNER EAR SURGERY Bilateral 03/19/2012  . NASAL SINUS SURGERY    . SALPINGOOPHORECTOMY    . Tympanoplasty with mastoidectomy      Home Medications:  Allergies as of 01/08/2021   No Known Allergies     Medication List       Accurate as of January 08, 2021  2:03 PM. If you have any questions, ask your nurse or doctor.        acetaminophen 500 MG tablet Commonly known as: TYLENOL Take 500 mg by mouth as needed.   calcium-vitamin D 500-200 MG-UNIT tablet Commonly known as: OSCAL WITH D Take 1 tablet by mouth.   Cholecalciferol 50 MCG (2000 UT) Tabs Take 2,000 Units by mouth daily.   Golimumab 50 MG/0.5ML Soaj Inject into the skin.   levothyroxine 88 MCG tablet Commonly known as: SYNTHROID Take 88 mcg by mouth daily before breakfast.   mirabegron ER 25 MG Tb24 tablet Commonly known as: MYRBETRIQ Take 1 tablet (25 mg total) by mouth daily.  Started by: Zara Council, PA-C   mycophenolate 180 MG EC tablet Commonly known as: MYFORTIC Take 2 tablets by mouth daily.   omeprazole 40 MG capsule Commonly known as: PRILOSEC TAKE 1 CAPSULE BY MOUTH ONCE DAILY FOR REFLUX.   polyethylene glycol powder 17 GM/SCOOP powder Commonly known as: GLYCOLAX/MIRALAX Take 17 g by mouth as needed.   predniSONE 5 MG tablet Commonly known as: DELTASONE Take 1 tablet by mouth daily.   Premarin vaginal cream Generic drug: conjugated estrogens Place 1 Applicatorful vaginally daily. Apply 0.63m (pea-sized amount)  just inside the vaginal introitus with a finger-tip on  Monday, Wednesday and Friday  nights. Started by: SZara Council PA-C   tacrolimus 1 MG capsule Commonly known as: PROGRAF Take 2 capsules by mouth in the morning and at bedtime.   traMADol 50 MG tablet Commonly known as: ULTRAM Take 50 mg by mouth as needed.       Allergies: No Known Allergies  Family History: Family History  Problem Relation Age of Onset  . Heart disease Father   . Arthritis Sister   . Heart disease Mother   . Thyroid disease Mother   . Cancer Sister   . Heart disease Maternal Grandmother   . Heart disease Maternal Grandfather   . Colon cancer Maternal Aunt   . Breast cancer Neg Hx     Social History:  reports that she has quit smoking. She has never used smokeless tobacco. She reports current alcohol use. She reports that she does not use drugs.  ROS: Pertinent ROS in HPI  Physical Exam: BP 131/86   Pulse 81   Ht 4' 11"  (1.499 m)   Wt 135 lb (61.2 kg)   BMI 27.27 kg/m   Constitutional:  Well nourished. Alert and oriented, No acute distress. HEENT: [place] AT, mask in place.  Trachea midline Cardiovascular: No clubbing, cyanosis, or edema. Respiratory: Normal respiratory effort, no increased work of breathing. GU: No CVA tenderness.  No bladder fullness or masses.  Atrophic external genitalia, sparse pubic hair distribution, no lesions.  Normal urethral meatus, no lesions, no prolapse, no discharge.   No urethral masses, tenderness and/or tenderness. No bladder fullness, tenderness or masses. Pale vagina mucosa, fair estrogen effect, no discharge, no lesions, poor pelvic support, grade II cystocele and grade III rectocele noted.  Anus and perineum are without rashes or lesions.    Neurologic: Grossly intact, no focal deficits, moving all 4 extremities. Psychiatric: Normal mood and affect.   Laboratory Data: Specimen:  Blood  Ref Range & Units 11 d ago  Glucose 70 - 110 mg/dL 72   Sodium 136 - 145 mmol/L 137   Potassium 3.6 - 5.1 mmol/L 4.0   Chloride 97 - 109 mmol/L 102    Carbon Dioxide (CO2) 22.0 - 32.0 mmol/L 28.5   Urea Nitrogen (BUN) 7 - 25 mg/dL 16   Creatinine 0.6 - 1.1 mg/dL 1.1   Glomerular Filtration Rate (eGFR), MDRD Estimate >60 mL/min/1.73sq m 50Low   Calcium 8.7 - 10.3 mg/dL 8.9   AST  8 - 39 U/L 11   ALT  5 - 38 U/L 10   Alk Phos (alkaline Phosphatase) 34 - 104 U/L 91   Albumin 3.5 - 4.8 g/dL 3.8   Bilirubin, Total 0.3 - 1.2 mg/dL 0.4   Protein, Total 6.1 - 7.9 g/dL 6.6   A/G Ratio 1.0 - 5.0 gm/dL 1.4   Resulting Agency  [facility]  Specimen Collected: 12/27/20 11:01 AM Last  Resulted: 12/27/20 3:30 PM  Received From: [facility]  Result Received: 01/02/21 4:57 PM   Specimen:  Blood  Ref Range & Units 11 d ago  WBC (White Blood Cell Count) 4.1 - 10.2 10^3/uL 6.1   RBC (Red Blood Cell Count) 4.04 - 5.48 10^6/uL 4.29   Hemoglobin 12.0 - 15.0 gm/dL 11.6Low   Hematocrit 35.0 - 47.0 % 36.2   MCV (Mean Corpuscular Volume) 80.0 - 100.0 fl 84.4   MCH (Mean Corpuscular Hemoglobin) 27.0 - 31.2 pg 27.0   MCHC (Mean Corpuscular Hemoglobin Concentration) 32.0 - 36.0 gm/dL 32.0   Platelet Count 150 - 450 10^3/uL 236   RDW-CV (Red Cell Distribution Width) 11.6 - 14.8 % 12.7   MPV (Mean Platelet Volume) 9.4 - 12.4 fl 9.9   Neutrophils 1.50 - 7.80 10^3/uL 3.86   Lymphocytes 1.00 - 3.60 10^3/uL 1.42   Monocytes 0.00 - 1.50 10^3/uL 0.47   Eosinophils 0.00 - 0.55 10^3/uL 0.25   Basophils 0.00 - 0.09 10^3/uL 0.08   Neutrophil % 32.0 - 70.0 % 63.4   Lymphocyte % 10.0 - 50.0 % 23.3   Monocyte % 4.0 - 13.0 % 7.7   Eosinophil % 1.0 - 5.0 % 4.1   Basophil% 0.0 - 2.0 % 1.3   Immature Granulocyte % <=0.7 % 0.2   Immature Granulocyte Count <=0.06 10^3/L 0.01   Resulting Agency  [facility]  Specimen Collected: 12/27/20 11:01 AM Last Resulted: 12/27/20 11:32 AM  Received From: [facility]  Result Received: 01/02/21 4:57 PM   Specimen:  Blood  Ref Range & Units 11 d ago Comments   Thyroid Stimulating Hormone (TSH) 0.450-5.330 uIU/ml uIU/mL 2.154  Reference Range for Pregnant Females >= 75 yrs old:  Normal Range for 1st trimester: 0.05-3.70 ulU/ml  Normal Range for 2nd trimester: 0.31-4.35 ulU/ml  Resulting Agency  [facility] - [facility]   Specimen Collected: 12/27/20 11:01 AM Last Resulted: 12/27/20 2:40 PM  Received From: [facility]  Result Received: 01/02/21 4:57 PM    Urinalysis Component     Latest Ref Rng & Units 01/08/2021  Specific Gravity, UA     1.005 - 1.030 1.010  pH, UA     5.0 - 7.5 5.5  Color, UA     Yellow Yellow  Appearance Ur     Clear Clear  Leukocytes,UA     Negative Trace (A)  Protein,UA     Negative/Trace Negative  Glucose, UA     Negative Negative  Ketones, UA     Negative Negative  RBC, UA     Negative Negative  Bilirubin, UA     Negative Negative  Urobilinogen, Ur     0.2 - 1.0 mg/dL 0.2  Nitrite, UA     Negative Negative  Microscopic Examination      See below:   Component     Latest Ref Rng & Units 01/08/2021  WBC, UA     0 - 5 /hpf 0-5  RBC     0 - 2 /hpf 0-2  Epithelial Cells (non renal)     0 - 10 /hpf 0-10  Renal Epithel, UA     None seen /hpf 0-10 (A)  Casts     None seen /lpf Present (A)  Cast Type     N/A Hyaline casts  Bacteria, UA     None seen/Few None seen  I have reviewed the labs.   Pertinent Imaging: Results for  ALITZEL, COOKSON (MRN [id number]) as of 01/08/2021 13:57  Ref. Range 01/08/2021 13:15  Scan Result Unknown [id number]    Assessment & Plan:    1. Mixed Incontinence -She has some pelvic floor weakness as evidenced with a cystocele and rectocele.  I explained to her that this is usually treated through physical therapy, pessary placement or surgery.  She has tried and failed a pessary in the past.  She is also exhibiting signs of urge incontinence.  I explained to her that sometimes it is difficult to discern which type of incontinence is the most bothersome type for  a person as treating 1 type versus any other may worsen the other type of incontinence.  She is very frustrated with her longstanding incontinence issues and wants something to control it as she feels she is too young to be bothered with her bladder at this time.  I gave her samples of Myrbetriq 25 mg, #28 samples and explained that this would likely address most of the urge incontinence that she is experiencing.  As she is motivated to get her incontinence under control, she may need further work-up and I will schedule appointment with Dr. Matilde Sprang to get his expertise on her condition.  2. Vaginal atrophy -I have given the patient Premarin vaginal estrogen sample and advised her to apply blueberry sized about to her vaginal area 3 nights weekly -I explained how vaginal atrophy contributes to pelvic floor weakness and that it takes several months of using the vaginal estrogen cream on a consistent basis to see necessary physiological changes within the vaginal area -I have sent a prescription to her pharmacy  Return for Appointment with Dr. Matilde Sprang.  These notes generated with voice recognition software. I apologize for typographical errors.  Zara Council, PA-C  [facility] [address]  [place], [place] [address] [phone]

## 2021-01-08 ENCOUNTER — Ambulatory Visit: Payer: BC Managed Care – PPO | Admitting: Urology

## 2021-01-08 ENCOUNTER — Other Ambulatory Visit: Payer: Self-pay

## 2021-01-08 ENCOUNTER — Encounter: Payer: Self-pay | Admitting: Urology

## 2021-01-08 DIAGNOSIS — N952 Postmenopausal atrophic vaginitis: Secondary | ICD-10-CM | POA: Diagnosis not present

## 2021-01-08 DIAGNOSIS — N3946 Mixed incontinence: Secondary | ICD-10-CM | POA: Diagnosis not present

## 2021-01-08 LAB — MICROSCOPIC EXAMINATION: Bacteria, UA: NONE SEEN

## 2021-01-08 LAB — URINALYSIS, COMPLETE
Bilirubin, UA: NEGATIVE
Glucose, UA: NEGATIVE
Ketones, UA: NEGATIVE
Nitrite, UA: NEGATIVE
Protein,UA: NEGATIVE
RBC, UA: NEGATIVE
Specific Gravity, UA: 1.01 (ref 1.005–1.030)
Urobilinogen, Ur: 0.2 mg/dL (ref 0.2–1.0)
pH, UA: 5.5 (ref 5.0–7.5)

## 2021-01-08 LAB — BLADDER SCAN AMB NON-IMAGING: Scan Result: 83

## 2021-01-08 MED ORDER — MIRABEGRON ER 25 MG PO TB24: 25.0000 mg | ORAL_TABLET | Freq: Every day | ORAL | 0 refills | Status: DC

## 2021-01-08 MED ORDER — PREMARIN 0.625 MG/GM VA CREA: 1.0000 | TOPICAL_CREAM | Freq: Every day | VAGINAL | 12 refills | Status: DC

## 2021-02-04 ENCOUNTER — Encounter: Payer: Self-pay | Admitting: Urology

## 2021-02-04 ENCOUNTER — Other Ambulatory Visit: Payer: Self-pay

## 2021-02-04 ENCOUNTER — Ambulatory Visit: Payer: BC Managed Care – PPO | Admitting: Urology

## 2021-02-04 VITALS — BP 149/90 | HR 92

## 2021-02-04 DIAGNOSIS — N3946 Mixed incontinence: Secondary | ICD-10-CM | POA: Diagnosis not present

## 2021-02-04 NOTE — Progress Notes (Signed)
02/04/2021 2:32 PM   Barbara Moss [birth year] [id number]  Referring provider: Leonel Ramsay, MD [place],  [place] [address]  No chief complaint on file.   HPI: Barbara Moss: Mixed incontinence and bedwetting.  Cystocele rectocele.  Myrbetriq and Premarin cream.  Patient leaks with coughing sneezing.  Primary symptom is urge incontinence.  Running water is a trigger.  She has high-volume bedwetting.  She wears 4 pads during the day that can be quite wet.  She wears 3 pads at night that are moderately wet to soaked.  She voids every 60 to 90 minutes.  She gets up 4 times a night.  Her flow was good.  Since taking Myrbetriq as long she does not drink much fluid her urge incontinence during the day is significantly better but she still has high-volume bedwetting.  She is only getting up twice at night on the Myrbetriq.  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  She has had a hysterectomy bladder suspension years ago.  She has mild intermittent burning.  She does not get infections.  No kidney stones.  She is prone to constipation.  No diabetes.  She has had low back ablation.  On pelvic examination patient had grade 1 hypermobility the bladder neck and negative cough test.  Small grade 1 cystocele.  Small grade 1-2 rectocele.  Issues anteriorly were a little bit dry especially in the upper vaginal vault   PMH: Past Medical History:  Diagnosis Date  . Abnormal liver enzymes 03/19/2015  . Anemia   . Arthritis   . Autoimmune hepatitis ([place]) 04/23/2015  . 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 GERD (gastroesophageal reflux disease)   . Hand discomfort 12/20/2014  . Hearing decreased   . History of MRSA infection    left thigh  . Hypothyroidism   . Iron deficiency anemia 03/19/2015  . Sleep apnea 03/19/2015  . Spontaneous bacterial peritonitis [facility])     Surgical History: Past Surgical History:  Procedure Laterality  Date  . ABDOMINAL HYSTERECTOMY    . Bladder tack    . COLONOSCOPY WITH PROPOFOL N/A 04/24/2016   Procedure: COLONOSCOPY WITH PROPOFOL;  Surgeon: Manya Silvas, MD;  Location: [facility] ENDOSCOPY;  Service: Endoscopy;  Laterality: N/A;  . INNER EAR SURGERY Bilateral 03/19/2012  . NASAL SINUS SURGERY    . SALPINGOOPHORECTOMY    . Tympanoplasty with mastoidectomy      Home Medications:  Allergies as of 02/04/2021   No Known Allergies     Medication List       Accurate as of February 04, 2021  2:32 PM. If you have any questions, ask your nurse or doctor.        acetaminophen 500 MG tablet Commonly known as: TYLENOL Take 500 mg by mouth as needed.   calcium-vitamin D 500-200 MG-UNIT tablet Commonly known as: OSCAL WITH D Take 1 tablet by mouth.   Cholecalciferol 50 MCG (2000 UT) Tabs Take 2,000 Units by mouth daily.   Golimumab 50 MG/0.5ML Soaj Inject into the skin.   levothyroxine 88 MCG tablet Commonly known as: SYNTHROID Take 88 mcg by mouth daily before breakfast.   mirabegron ER 25 MG Tb24 tablet Commonly known as: MYRBETRIQ Take 1 tablet (25 mg total) by mouth daily.   mycophenolate 180 MG EC tablet Commonly known as: MYFORTIC Take 2 tablets by mouth daily.   omeprazole 40 MG capsule Commonly known as: PRILOSEC TAKE 1 CAPSULE  BY MOUTH ONCE DAILY FOR REFLUX.   polyethylene glycol powder 17 GM/SCOOP powder Commonly known as: GLYCOLAX/MIRALAX Take 17 g by mouth as needed.   predniSONE 5 MG tablet Commonly known as: DELTASONE Take 1 tablet by mouth daily.   Premarin vaginal cream Generic drug: conjugated estrogens Place 1 Applicatorful vaginally daily. Apply 0.5mg  (pea-sized amount)  just inside the vaginal introitus with a finger-tip on  Monday, Wednesday and Friday nights.   tacrolimus 1 MG capsule Commonly known as: PROGRAF Take 2 capsules by mouth in the morning and at bedtime.   traMADol 50 MG tablet Commonly known as: ULTRAM Take 50 mg by mouth as  needed.       Allergies: No Known Allergies  Family History: Family History  Problem Relation Age of Onset  . Heart disease Father   . Arthritis Sister   . Heart disease Mother   . Thyroid disease Mother   . Cancer Sister   . Heart disease Maternal Grandmother   . Heart disease Maternal Grandfather   . Colon cancer Maternal Aunt   . Breast cancer Neg Hx     Social History:  reports that she has quit smoking. She has never used smokeless tobacco. She reports current alcohol use. She reports that she does not use drugs.  ROS:                                        Physical Exam: There were no vitals taken for this visit.  Constitutional:  Alert and oriented, No acute distress.  Laboratory Data: Lab Results  Component Value Date   WBC 2.7 (L) 03/09/2018   HGB 10.6 (L) 03/09/2018   HCT 30.0 (L) 03/09/2018   MCV 125.5 (H) 03/09/2018   PLT 71 (L) 03/09/2018    Lab Results  Component Value Date   CREATININE 0.76 03/09/2018    No results found for: PSA  No results found for: TESTOSTERONE  No results found for: HGBA1C  Urinalysis    Component Value Date/Time   COLORURINE AMBER (A) 03/09/2018 1236   APPEARANCEUR Clear 01/08/2021 1314   LABSPEC 1.026 03/09/2018 1236   PHURINE 5.0 03/09/2018 1236   GLUCOSEU Negative 01/08/2021 [place] 03/09/2018 1236   BILIRUBINUR Negative 01/08/2021 1314   KETONESUR 5 (A) 03/09/2018 1236   PROTEINUR Negative 01/08/2021 1314   PROTEINUR NEGATIVE 03/09/2018 1236   NITRITE Negative 01/08/2021 1314   NITRITE NEGATIVE 03/09/2018 1236   LEUKOCYTESUR Trace (A) 01/08/2021 1314    Pertinent Imaging: Chart reviewed.  Assessment & Plan: Patient has mixed incontinence.  She has had a liver transplant.  She has bedwetting.  The role of urodynamics and cystoscopy discussed.  The ankle implant and InterStim likely not good options in the face of liver transplantation.  Some of the antimuscarinics may  be less ideal because of her liver transplantation.  Urodynamics ordered.  The patient does feel something in the vagina and she thinks he is feeling a rectocele.  I must say the findings were not very significant.  I will have her bear down again during the cystoscopy.  She is doing the estrogen cream nightly and she will switch to 2-3 times a week.  It will be interesting to see if further estrogen cream helps the symptoms.  She did have some narrowing of the vagina but she is petite  There are no diagnoses linked  to this encounter.  No follow-ups on file.  Reece Packer, MD  [facility] [address] [place], [place] [address] [phone]

## 2021-02-11 ENCOUNTER — Ambulatory Visit: Admit: 2021-02-11 | Discharge: 2021-02-12 | Payer: PRIVATE HEALTH INSURANCE

## 2021-03-14 ENCOUNTER — Other Ambulatory Visit: Payer: Self-pay | Admitting: Urology

## 2021-03-18 ENCOUNTER — Other Ambulatory Visit: Payer: Self-pay | Admitting: Urology

## 2021-03-28 DIAGNOSIS — Z79899 Other long term (current) drug therapy: Principal | ICD-10-CM

## 2021-03-28 DIAGNOSIS — Z944 Liver transplant status: Principal | ICD-10-CM

## 2021-03-28 MED ORDER — MYCOPHENOLATE SODIUM 180 MG TABLET,DELAYED RELEASE
ORAL_TABLET | Freq: Two times a day (BID) | ORAL | 10 refills | 0.00000 days | Status: CP
Start: 2021-03-28 — End: ?

## 2021-04-01 ENCOUNTER — Ambulatory Visit: Payer: BC Managed Care – PPO | Admitting: Urology

## 2021-04-01 ENCOUNTER — Encounter: Payer: Self-pay | Admitting: Urology

## 2021-04-01 ENCOUNTER — Other Ambulatory Visit: Payer: Self-pay

## 2021-04-01 VITALS — BP 131/97 | HR 77

## 2021-04-01 DIAGNOSIS — N3946 Mixed incontinence: Secondary | ICD-10-CM

## 2021-04-01 MED ORDER — OXYBUTYNIN CHLORIDE ER 10 MG PO TB24: 10.0000 mg | ORAL_TABLET | Freq: Every day | ORAL | 11 refills | Status: DC

## 2021-04-01 NOTE — Addendum Note (Signed)
Addended by: Verlene Mayer A on: 04/01/2021 12:22 PM   Modules accepted: Orders

## 2021-04-01 NOTE — Progress Notes (Signed)
04/01/2021 11:37 AM   Barbara Moss 08/14/1956 [id number]  Referring provider: Leonel Ramsay, MD [place],  [place] [address]  Chief Complaint  Patient presents with   Cysto    HPI: Barbara Moss: Mixed incontinence and bedwetting.  Cystocele rectocele.  Myrbetriq and Premarin cream.   Patient leaks with coughing sneezing.  Primary symptom is urge incontinence.  Running water is a trigger.  She has high-volume bedwetting.  She wears 4 pads during the day that can be quite wet.  She wears 3 pads at night that are moderately wet to soaked.   She voids every 60 to 90 minutes.  She gets up 4 times a night.  Her flow was good.   Since taking Myrbetriq as long she does not drink much fluid her urge incontinence during the day is significantly better but she still has high-volume bedwetting.  She is only getting up twice at night on the Myrbetriq.  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   She has had a hysterectomy bladder suspension years ago.  She has mild intermittent burning.   She does not get infections.  No kidney stones.  She is prone to constipation.  No diabetes.  She has had low back ablation.   On pelvic examination patient had grade 1 hypermobility the bladder neck and negative cough test.  Small grade 1 cystocele.  Small grade 1-2 rectocele.  Issues anteriorly were a little bit dry especially in the upper vaginal vault  Patient has mixed incontinence.  She has had a liver transplant.  She has bedwetting.  The role of urodynamics and cystoscopy discussed.  The ankle implant and InterStim likely not good options in the face of liver transplantation.  Some of the antimuscarinics may be less ideal because of her liver transplantation.  It was for hepatitis   Urodynamics ordered.  The patient does feel something in the vagina and she thinks he is feeling a rectocele.  I must say the findings were not very significant.   I will have her bear down again during the cystoscopy.  She is doing the estrogen cream nightly and she will switch to 2-3 times a week.  It will be interesting to see if further estrogen cream helps the symptoms.  She did have some narrowing of the vagina but she is petite  Today [place] stable.  Incontinence stable. On pelvic examination little to no prolapse with shortening of the vagina.  She does not have any prolapse to repair.  Estrogen represcribed since prescription ran out.  She did not leak a few drops with coughing On urodynamics patient voided 133 mL with a maximal flow 25 mils per second.  Residual was a few milliliters.  Maximum bladder capacity was 163 mL.  He had instability at 105 mL with a maximum pressure of 70 cm of water.  At higher volume she leaked a large amount.  She had to be refilled and she had a second contraction to 15 cm water leaking about 20 mL.  It was difficult to assess stress incontinence because of her small capacity and stable bladder.  At 60 mL her leak point pressure was 40 cm of water.  It was 15 cm of water with a Valsalva.  During voluntary voiding she voided 58 mils of maximal with a 15 mils per second.  Max voiding pressure 18 cm water.  Residual 33 mL.  EMG quiet.  The details of the urodynamics are signed and dictated  She underwent Stoffer utilizing sterile technique.  Bladder mucosa and trigone were normal.  No foreign body.  No carcinoma.  No cystitis.  Well-tolerated.   PMH: Past Medical History:  Diagnosis Date   Abnormal liver enzymes 03/19/2015   Anemia    Arthritis    Autoimmune hepatitis ([place]) 04/23/2015   Cirrhosis, non-alcoholic ([place]) 01/14/346   GERD (gastroesophageal reflux disease)    Hand discomfort 12/20/2014   Hearing decreased    History of MRSA infection    left thigh   Hypothyroidism    Iron deficiency anemia 03/19/2015   Sleep apnea 03/19/2015   Spontaneous bacterial peritonitis [facility])     Surgical History: Past Surgical History:   Procedure Laterality Date   ABDOMINAL HYSTERECTOMY     Bladder tack     COLONOSCOPY WITH PROPOFOL N/A 04/24/2016   Procedure: COLONOSCOPY WITH PROPOFOL;  Surgeon: Manya Silvas, MD;  Location: [facility];  Service: Endoscopy;  Laterality: N/A;   INNER EAR SURGERY Bilateral 03/19/2012   NASAL SINUS SURGERY     SALPINGOOPHORECTOMY     Tympanoplasty with mastoidectomy      Home Medications:  Allergies as of 04/01/2021   No Known Allergies      Medication List        Accurate as of April 01, 2021 11:37 AM. If you have any questions, ask your nurse or doctor.          acetaminophen 500 MG tablet Commonly known as: TYLENOL Take 500 mg by mouth as needed.   calcium-vitamin D 500-200 MG-UNIT tablet Commonly known as: OSCAL WITH D Take 1 tablet by mouth.   Cholecalciferol 50 MCG (2000 UT) Tabs Take 2,000 Units by mouth daily.   Forteo 620 MCG/2.48ML Sopn Generic drug: Teriparatide (Recombinant) Inject into the skin.   Golimumab 50 MG/0.5ML Soaj Inject into the skin.   levothyroxine 88 MCG tablet Commonly known as: SYNTHROID Take 88 mcg by mouth daily before breakfast.   levothyroxine 75 MCG tablet Commonly known as: SYNTHROID Take 75 mcg by mouth daily.   mirabegron ER 25 MG Tb24 tablet Commonly known as: MYRBETRIQ Take 1 tablet (25 mg total) by mouth daily.   mycophenolate 180 MG EC tablet Commonly known as: MYFORTIC Take 2 tablets by mouth daily.   omeprazole 40 MG capsule Commonly known as: PRILOSEC TAKE 1 CAPSULE BY MOUTH ONCE DAILY FOR REFLUX.   polyethylene glycol powder 17 GM/SCOOP powder Commonly known as: GLYCOLAX/MIRALAX Take 17 g by mouth as needed.   predniSONE 5 MG tablet Commonly known as: DELTASONE Take 1 tablet by mouth daily.   Premarin vaginal cream Generic drug: conjugated estrogens Place 1 Applicatorful vaginally daily. Apply 0.5mg  (pea-sized amount)  just inside the vaginal introitus with a finger-tip on  Monday, Wednesday  and Friday nights.   tacrolimus 1 MG capsule Commonly known as: PROGRAF Take 2 capsules by mouth in the morning and at bedtime.   traMADol 50 MG tablet Commonly known as: ULTRAM Take 50 mg by mouth as needed.        Allergies: No Known Allergies  Family History: Family History  Problem Relation Age of Onset   Heart disease Father    Arthritis Sister    Heart disease Mother    Thyroid disease Mother    Cancer Sister    Heart disease Maternal Grandmother    Heart disease Maternal Grandfather    Colon cancer Maternal Aunt    Breast  cancer Neg Hx     Social History:  reports that she has quit smoking. She has never used smokeless tobacco. She reports current alcohol use. She reports that she does not use drugs.  ROS:                                        Physical Exam: There were no vitals taken for this visit.  Constitutional:  Alert and oriented, No acute distress.   Laboratory Data: Lab Results  Component Value Date   WBC 2.7 (L) 03/09/2018   HGB 10.6 (L) 03/09/2018   HCT 30.0 (L) 03/09/2018   MCV 125.5 (H) 03/09/2018   PLT 71 (L) 03/09/2018    Lab Results  Component Value Date   CREATININE 0.76 03/09/2018    No results found for: PSA  No results found for: TESTOSTERONE  No results found for: HGBA1C  Urinalysis    Component Value Date/Time   COLORURINE AMBER (A) 03/09/2018 1236   APPEARANCEUR Clear 01/08/2021 1314   LABSPEC 1.026 03/09/2018 1236   PHURINE 5.0 03/09/2018 1236   GLUCOSEU Negative 01/08/2021 [place] 03/09/2018 1236   BILIRUBINUR Negative 01/08/2021 1314   KETONESUR 5 (A) 03/09/2018 1236   PROTEINUR Negative 01/08/2021 1314   PROTEINUR NEGATIVE 03/09/2018 1236   NITRITE Negative 01/08/2021 1314   NITRITE NEGATIVE 03/09/2018 1236   LEUKOCYTESUR Trace (A) 01/08/2021 1314    Pertinent Imaging:   Assessment & Plan: Patient has mixed incontinence.  She has a very small capacity overactive  bladder.  Her overactivity frequency was impressive during the study  she does have significant reduced leak point pressures and a bulking agent would be something considered.  I would not be offering her a sling or mesh especially with her small capacity overactive bladder and liver transplant.  I think sacral nerve stimulation would be safe in the face of being immunocompromise but not the ankle.  Reevaluate in 6 weeks on oxybutynin ER 10 mg 30x11 and proceed accordingly.  The new beta 3 agonist would be a very good choice for her to consider  1. Mixed incontinence  - Urinalysis, Complete   No follow-ups on file.  Reece Packer, MD  [facility] [address] [place], [place] [address] [phone]

## 2021-04-02 LAB — URINALYSIS, COMPLETE
Bilirubin, UA: NEGATIVE
Glucose, UA: NEGATIVE
Ketones, UA: NEGATIVE
Leukocytes,UA: NEGATIVE
Nitrite, UA: NEGATIVE
Protein,UA: NEGATIVE
RBC, UA: NEGATIVE
Specific Gravity, UA: <1.005 — ABNORMAL LOW (ref 1.005–1.030)
Urobilinogen, Ur: 0.2 mg/dL (ref 0.2–1.0)
pH, UA: 5.5 (ref 5.0–7.5)

## 2021-04-02 LAB — MICROSCOPIC EXAMINATION: Bacteria, UA: NONE SEEN

## 2021-04-03 ENCOUNTER — Ambulatory Visit: Payer: BC Managed Care – PPO | Admitting: Dermatology

## 2021-04-03 ENCOUNTER — Encounter: Payer: Self-pay | Admitting: Dermatology

## 2021-04-03 ENCOUNTER — Other Ambulatory Visit: Payer: Self-pay

## 2021-04-03 DIAGNOSIS — D692 Other nonthrombocytopenic purpura: Secondary | ICD-10-CM

## 2021-04-03 DIAGNOSIS — L570 Actinic keratosis: Secondary | ICD-10-CM | POA: Diagnosis not present

## 2021-04-03 DIAGNOSIS — L821 Other seborrheic keratosis: Secondary | ICD-10-CM

## 2021-04-03 DIAGNOSIS — L578 Other skin changes due to chronic exposure to nonionizing radiation: Secondary | ICD-10-CM | POA: Diagnosis not present

## 2021-04-03 NOTE — Progress Notes (Signed)
   Follow-Up Visit   Subjective  Barbara Moss is a 65 y.o. female who presents for the following: Actinic Keratosis (Recheck the L shoulder, R med calf, R forearm, and L pretibia for persistent skin lesions - previously tx with LN2).  The following portions of the chart were reviewed this encounter and updated as appropriate:   Tobacco  Allergies  Meds  Problems  Med Hx  Surg Hx  Fam Hx      Review of Systems:  No other skin or systemic complaints except as noted in HPI or Assessment and Plan.  Objective  Well appearing patient in no apparent distress; mood and affect are within normal limits.  A focused examination was performed including the legs, chest, back and arms. Relevant physical exam findings are noted in the Assessment and Plan.  L pretibia Erythematous thin papules/macules with gritty scale.   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 Discussed biopsy vs LN2 and close monitoring with recheck and patient defers biopsy today. Will consider biopsy at follow-up in 4-6 weeks if not resolved  Start Mupirocin 2% ointment to aa and cover with Band-aid. Patient already has prescription at home.   Prior to procedure, discussed risks of blister formation, small wound, skin dyspigmentation, or rare scar following cryotherapy. Recommend Vaseline ointment to treated areas while healing.   Destruction of lesion - L pretibia  Destruction method: cryotherapy   Informed consent: discussed and consent obtained   Lesion destroyed using liquid nitrogen: Yes   Cryotherapy cycles:  2 Outcome: patient tolerated procedure well with no complications   Post-procedure details: wound care instructions given    Actinic keratosis L forearm  Within SK   Prior to procedure, discussed risks of blister formation, small wound, skin dyspigmentation,  or rare scar following cryotherapy. Recommend Vaseline ointment to treated areas while healing.   Destruction of lesion - L forearm  Destruction method: cryotherapy   Informed consent: discussed and consent obtained   Lesion destroyed using liquid nitrogen: Yes   Cryotherapy cycles:  2 Outcome: patient tolerated procedure well with no complications   Post-procedure details: wound care instructions given    Seborrheic Keratoses - Stuck-on, waxy, tan-brown papules and/or plaques  - Benign-appearing - Discussed benign etiology and prognosis. - Observe - Call for any changes  Actinic Damage - chronic, secondary to cumulative UV radiation exposure/sun exposure over time - diffuse scaly erythematous macules with underlying dyspigmentation - Recommend daily broad spectrum sunscreen SPF 30+ to sun-exposed areas, reapply every 2 hours as needed.  - Recommend staying in the shade or wearing long sleeves, sun glasses (UVA+UVB protection) and wide brim hats (4-inch brim around the entire circumference of the hat). - Call for new or changing lesions.  Purpura - Chronic; persistent and recurrent.  Treatable, but not curable. - Violaceous macules and patches - Benign - Related to trauma, age, sun damage and/or use of blood thinners, chronic use of topical and/or oral steroids - Observe - Can use OTC arnica containing moisturizer such as Dermend Bruise Formula if desired - Call for worsening or other concerns  Return for 4-6 weeks for AK recheck .  Luther Redo, CMA, am acting as scribe for Forest Gleason, MD .  Documentation: I have reviewed the above documentation for accuracy and completeness, and I agree with the above.  Forest Gleason, MD

## 2021-04-03 NOTE — Patient Instructions (Addendum)
If you have any questions or concerns for your doctor, please call our main line at [phone] and press option 4 to reach your doctor's medical assistant. If no one answers, please leave a voicemail as directed and we will return your call as soon as possible. Messages left after 4 pm will be answered the following business day.   You may also send us a message via MyChart. We typically respond to MyChart messages within 1-2 business days.  For prescription refills, please ask your pharmacy to contact our office. Our fax number is [phone].  If you have an urgent issue when the clinic is closed that cannot wait until the next business day, you can page your doctor at the number below.    Please note that while we do our best to be available for urgent issues outside of office hours, we are not available 24/7.   If you have an urgent issue and are unable to reach us, you may choose to seek medical care at your doctor's office, retail clinic, urgent care center, or emergency room.  If you have a medical emergency, please immediately call 911 or go to the emergency department.  Pager Numbers  - Dr. Kowalski: [phone]  - Dr. Moye: [phone]  - Dr. Stewart: [phone]  In the event of inclement weather, please call our main line at [phone] for an update on the status of any delays or closures.  Dermatology Medication Tips: Please keep the boxes that topical medications come in in order to help keep track of the instructions about where and how to use these. Pharmacies typically print the medication instructions only on the boxes and not directly on the medication tubes.   If your medication is too expensive, please contact our office at [phone] option 4 or send us a message through MyChart.   We are unable to tell what your co-pay for medications will be in advance as this is different depending on your insurance coverage. However, we may be able to find a substitute  medication at lower cost or fill out paperwork to get insurance to cover a needed medication.   If a prior authorization is required to get your medication covered by your insurance company, please allow us 1-2 business days to complete this process.  Drug prices often vary depending on where the prescription is filled and some pharmacies may offer cheaper prices.  The website www.goodrx.com contains coupons for medications through different pharmacies. The prices here do not account for what the cost may be with help from insurance (it may be cheaper with your insurance), but the website can give you the price if you did not use any insurance.  - You can print the associated coupon and take it with your prescription to the pharmacy.  - You may also stop by our office during regular business hours and pick up a GoodRx coupon card.  - If you need your prescription sent electronically to a different pharmacy, notify our office through [facility] MyChart or by phone at [phone] option 4.  Recommend taking Heliocare sun protection supplement daily in sunny weather for additional sun protection. For maximum protection on the sunniest days, you can take up to 2 capsules of regular Heliocare OR take 1 capsule of Heliocare Ultra. For prolonged exposure (such as a full day in the sun), you can repeat your dose of the supplement 4 hours after your first dose. Heliocare can be purchased at [facility] or at www.heliocare.com.    

## 2021-04-04 ENCOUNTER — Telehealth: Payer: Self-pay

## 2021-04-04 LAB — CULTURE, URINE COMPREHENSIVE

## 2021-04-04 MED ORDER — OMEPRAZOLE 40 MG CAPSULE,DELAYED RELEASE
Freq: Every day | ORAL | 0.00000 days
Start: 2021-04-04 — End: ?

## 2021-04-04 MED ORDER — NITROFURANTOIN MONOHYD MACRO 100 MG PO CAPS: 100.0000 mg | ORAL_CAPSULE | Freq: Two times a day (BID) | ORAL | 0 refills | Status: DC

## 2021-04-04 NOTE — Telephone Encounter (Signed)
-----   Message from Chrystie Nose, [place] sent at 04/04/2021  1:43 PM EDT -----  ----- Message ----- From: Bjorn Loser, MD Sent: 04/04/2021  12:34 PM EDT To: Chrystie Nose, CMA  Macrodantin 100 mg bid for 7 days ----- Message ----- From: Chrystie Nose, CMA Sent: 04/04/2021   7:40 AM EDT To: Bjorn Loser, MD   ----- Message ----- From: Interface, [facility] Lab Results In Sent: 04/02/2021   9:41 AM EDT To: Rowe Robert Clinical

## 2021-04-04 NOTE — Telephone Encounter (Signed)
Pt aware and will pick up medication

## 2021-04-08 ENCOUNTER — Other Ambulatory Visit: Payer: Self-pay | Admitting: Urology

## 2021-05-13 ENCOUNTER — Other Ambulatory Visit: Payer: Self-pay

## 2021-05-13 ENCOUNTER — Encounter: Payer: Self-pay | Admitting: Urology

## 2021-05-13 ENCOUNTER — Ambulatory Visit: Payer: BC Managed Care – PPO | Admitting: Urology

## 2021-05-13 VITALS — BP 130/83 | HR 88 | Ht 59.0 in | Wt 138.0 lb

## 2021-05-13 DIAGNOSIS — N3946 Mixed incontinence: Secondary | ICD-10-CM

## 2021-05-13 MED ORDER — GEMTESA 75 MG TABLET
Freq: Every day | ORAL | 0 days
Start: 2021-05-13 — End: ?

## 2021-05-13 MED ORDER — GEMTESA 75 MG PO TABS: 75.0000 mg | ORAL_TABLET | Freq: Every day | ORAL | 0 refills | Status: DC

## 2021-05-13 NOTE — Progress Notes (Signed)
05/13/2021 11:09 AM   [name] 1956-01-01 [id number]  Referring provider: Leonel Ramsay, MD [place],  [place] [address]  No chief complaint on file.   HPI:  Patient leaks with coughing sneezing.  Primary symptom is urge incontinence.  Running water is a trigger.  She has high-volume bedwetting.  She wears 4 pads during the day that can be quite wet.  She wears 3 pads at night that are moderately wet to soaked.   She voids every 60 to 90 minutes.  She gets up 4 times a night.  Her flow was good.   Since taking Myrbetriq as long she does not drink much fluid her urge incontinence during the day is significantly better but she still has high-volume bedwetting.  She is only getting up twice at night on the Myrbetriq.  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   She has had a hysterectomy bladder suspension years ago.  She has mild intermittent burning.  On pelvic examination patient had grade 1 hypermobility the bladder neck and negative cough test.  Small grade 1 cystocele.  Small grade 1-2 rectocele.  tissues anteriorly were a little bit dry especially in the upper vaginal vault   Patient has mixed incontinence.  She has had a liver transplant.  She has bedwetting.  The role of urodynamics and cystoscopy discussed.  The ankle implant and InterStim likely not good options in the face of liver transplantation.  Some of the antimuscarinics may be less ideal because of her liver transplantation.  It was for hepatitis   Urodynamics ordered.  The patient does feel something in the vagina and she thinks he is feeling a rectocele.  I must say the findings were not very significant.  I will have her bear down again during the cystoscopy.  She is doing the estrogen cream nightly and she will switch to 2-3 times a week.  It will be interesting to see if further estrogen cream helps the symptoms.  She did have some narrowing of  the vagina but she is petite   On pelvic examination little to no prolapse with shortening of the vagina.  She does not have any prolapse to repair.  Estrogen represcribed since prescription ran out.  She did not leak a few drops with coughing On urodynamics patient voided 133 mL with a maximal flow 25 mils per second.  Residual was a few milliliters.  Maximum bladder capacity was 163 mL.  He had instability at 105 mL with a maximum pressure of 70 cm of water.  At higher volume she leaked a large amount.  She had to be refilled and she had a second contraction to 15 cm water leaking about 20 mL.  It was difficult to assess stress incontinence because of her small capacity and stable bladder.  At 60 mL her leak point pressure was 40 cm of water.  It was 15 cm of water with a Valsalva.  During voluntary voiding she voided 58 mils of maximal with a 15 mils per second.  Max voiding pressure 18 cm water.  Residual 33 mL.  EMG quiet.     Cystoscopy normal  Patient has mixed incontinence.  She has a very small capacity overactive bladder.  Her overactivity frequency was impressive during the study  she does have significant reduced leak point pressures and a bulking agent would be something considered.  I would not be offering  her a sling or mesh especially with her small capacity overactive bladder and liver transplant.  I think sacral nerve stimulation would be safe in the face of being immunocompromise but not the ankle.  Reevaluate in 6 weeks on oxybutynin ER 10 mg 30x11 and proceed accordingly.  The new beta 3 agonist would be a very good choice for her to consider    Today Frequency stable.  I have not seen the patient for over a year. She is improved when she takes oxybutynin reduces her fluids.  She has to drink a lot of fluids and then has not working that well.  She has little bit of dysuria.  This is more recent.   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   GERD (gastroesophageal reflux disease)    Hand discomfort 12/20/2014   Hearing decreased    History of MRSA infection    left thigh   Hypothyroidism    Iron deficiency anemia 03/19/2015   Sleep apnea 03/19/2015   Spontaneous bacterial peritonitis [facility])     Surgical History: Past Surgical History:  Procedure Laterality Date   ABDOMINAL HYSTERECTOMY     Bladder tack     COLONOSCOPY WITH PROPOFOL N/A 04/24/2016   Procedure: COLONOSCOPY WITH PROPOFOL;  Surgeon: Manya Silvas, MD;  Location: [facility];  Service: Endoscopy;  Laterality: N/A;   INNER EAR SURGERY Bilateral 03/19/2012   NASAL SINUS SURGERY     SALPINGOOPHORECTOMY     Tympanoplasty with mastoidectomy      Home Medications:  Allergies as of 05/13/2021   No Known Allergies      Medication List        Accurate as of May 13, 2021 11:09 AM. If you have any questions, ask your nurse or doctor.          acetaminophen 500 MG tablet Commonly known as: TYLENOL Take 500 mg by mouth as needed.   calcium-vitamin D 500-200 MG-UNIT tablet Commonly known as: OSCAL WITH D Take 1 tablet by mouth.   Cholecalciferol 50 MCG (2000 UT) Tabs Take 2,000 Units by mouth daily.   Golimumab 50 MG/0.5ML Soaj Inject into the skin.   levothyroxine 88 MCG tablet Commonly known as: SYNTHROID Take 88 mcg by mouth daily before breakfast.   levothyroxine 75 MCG tablet Commonly known as: SYNTHROID Take 75 mcg by mouth daily.   mycophenolate 180 MG EC tablet Commonly known as: MYFORTIC Take 2 tablets by mouth daily.   nitrofurantoin (macrocrystal-monohydrate) 100 MG capsule Commonly known as: MACROBID Take 1 capsule (100 mg total) by mouth every 12 (twelve) hours.   omeprazole 40 MG capsule Commonly known as: PRILOSEC TAKE 1 CAPSULE BY MOUTH ONCE DAILY FOR REFLUX.   oxybutynin 10 MG 24 hr tablet Commonly known as:  DITROPAN-XL Take by mouth.   oxybutynin 10 MG 24 hr tablet Commonly known as: DITROPAN-XL Take 1 tablet (10 mg total) by mouth daily.   polyethylene glycol powder 17 GM/SCOOP powder Commonly known as: GLYCOLAX/MIRALAX Take 17 g by mouth as needed.   predniSONE 5 MG tablet Commonly known as: DELTASONE Take 1 tablet by mouth daily.   tacrolimus 1 MG capsule Commonly known as: PROGRAF Take 2 capsules by mouth in the morning and at bedtime.   traMADol 50 MG tablet Commonly known as: ULTRAM Take 50 mg by mouth as needed.  Allergies: No Known Allergies  Family History: Family History  Problem Relation Age of Onset   Heart disease Father    Arthritis Sister    Heart disease Mother    Thyroid disease Mother    Cancer Sister    Heart disease Maternal Grandmother    Heart disease Maternal Grandfather    Colon cancer Maternal Aunt    Breast cancer Neg Hx     Social History:  reports that she has quit smoking. She has never used smokeless tobacco. She reports current alcohol use. She reports that she does not use drugs.  ROS:                                        Physical Exam: There were no vitals taken for this visit.  Constitutional:  Alert and oriented, No acute distress.   Laboratory Data: Lab Results  Component Value Date   WBC 2.7 (L) 03/09/2018   HGB 10.6 (L) 03/09/2018   HCT 30.0 (L) 03/09/2018   MCV 125.5 (H) 03/09/2018   PLT 71 (L) 03/09/2018    Lab Results  Component Value Date   CREATININE 0.76 03/09/2018    No results found for: PSA  No results found for: TESTOSTERONE  No results found for: HGBA1C  Urinalysis    Component Value Date/Time   COLORURINE AMBER (A) 03/09/2018 1236   APPEARANCEUR Clear 04/01/2021 1134   LABSPEC 1.026 03/09/2018 1236   PHURINE 5.0 03/09/2018 1236   GLUCOSEU Negative 04/01/2021 [place] 03/09/2018 1236   BILIRUBINUR Negative 04/01/2021 1134   KETONESUR 5 (A)  03/09/2018 1236   PROTEINUR Negative 04/01/2021 [place] 03/09/2018 1236   NITRITE Negative 04/01/2021 1134   NITRITE NEGATIVE 03/09/2018 1236   LEUKOCYTESUR Negative 04/01/2021 1134    Pertinent Imaging:   Assessment & Plan: Call if urine culture positive.  See in 6 weeks on new beta 3 agonist.  If this does not reach her treatment goal we will discuss other potential options.  Call if urine culture is positive  There are no diagnoses linked to this encounter.  No follow-ups on file.  Reece Packer, MD  [facility] [address] [place], [place] [address] [phone]

## 2021-05-15 LAB — CULTURE, URINE COMPREHENSIVE

## 2021-05-21 ENCOUNTER — Other Ambulatory Visit: Payer: Self-pay

## 2021-05-21 ENCOUNTER — Encounter: Payer: Self-pay | Admitting: Dermatology

## 2021-05-21 ENCOUNTER — Ambulatory Visit: Payer: BC Managed Care – PPO | Admitting: Dermatology

## 2021-05-21 ENCOUNTER — Ambulatory Visit: Admit: 2021-05-21 | Discharge: 2021-05-22 | Payer: PRIVATE HEALTH INSURANCE

## 2021-05-21 DIAGNOSIS — D692 Other nonthrombocytopenic purpura: Secondary | ICD-10-CM | POA: Diagnosis not present

## 2021-05-21 DIAGNOSIS — L821 Other seborrheic keratosis: Secondary | ICD-10-CM

## 2021-05-21 DIAGNOSIS — Z872 Personal history of diseases of the skin and subcutaneous tissue: Secondary | ICD-10-CM | POA: Diagnosis not present

## 2021-05-21 DIAGNOSIS — L570 Actinic keratosis: Secondary | ICD-10-CM

## 2021-05-21 NOTE — Progress Notes (Signed)
   Follow-Up Visit   Subjective  Barbara Moss is a 65 y.o. female who presents for the following: Actinic Keratosis (6 weeks  f/u Aks Left pretibial, left forearm ).  The following portions of the chart were reviewed this encounter and updated as appropriate:   Tobacco  Allergies  Meds  Problems  Med Hx  Surg Hx  Fam Hx      Review of Systems:  No other skin or systemic complaints except as noted in HPI or Assessment and Plan.  Objective  Well appearing patient in no apparent distress; mood and affect are within normal limits.  A focused examination was performed including face, left forearm, left pretibial. Relevant physical exam findings are noted in the Assessment and Plan.  Left forearm x 3 (3)  Erythematous thin papules/macules with gritty scale without features suspicious for malignancy on dermoscopy    left pretibial Clear skin    Assessment & Plan  AK (actinic keratosis) (3) Left forearm x 3  Prior to procedure, discussed risks of blister formation, small wound, skin dyspigmentation, or rare scar following cryotherapy. Recommend Vaseline ointment to treated areas while healing.   Consider biopsy if superior lesion not clearing with cryotherapy  Destruction of lesion - Left forearm x 3 Complexity: simple   Destruction method: cryotherapy   Informed consent: discussed and consent obtained   Timeout:  patient name, date of birth, surgical site, and procedure verified Lesion destroyed using liquid nitrogen: Yes   Region frozen until ice ball extended beyond lesion: Yes   Outcome: patient tolerated procedure well with no complications   Post-procedure details: wound care instructions given    History of actinic keratosis left pretibial  Actinic keratoses are precancerous spots that appear secondary to cumulative UV radiation exposure/sun exposure over time. They are chronic with expected duration over 1 year. A portion of actinic keratoses will progress to  squamous cell carcinoma of the skin. It is not possible to reliably predict which spots will progress to skin cancer and so treatment is recommended to prevent development of skin cancer.  Recommend daily broad spectrum sunscreen SPF 30+ to sun-exposed areas, reapply every 2 hours as needed.  Recommend staying in the shade or wearing long sleeves, sun glasses (UVA+UVB protection) and wide brim hats (4-inch brim around the entire circumference of the hat). Call for new or changing lesions.   Purpura - Chronic; persistent and recurrent.  Treatable, but not curable. Arms  - Violaceous macules and patches - Benign - Related to trauma, age, sun damage and/or use of blood thinners, chronic use of topical and/or oral steroids - Observe - Can use OTC arnica containing moisturizer such as Dermend Bruise Formula if desired - Call for worsening or other concerns   Seborrheic Keratoses legs - Stuck-on, waxy, tan-brown papules and/or plaques  - Benign-appearing - Discussed benign etiology and prognosis. - Observe - Call for any changes    Return in about 3 months (around 08/21/2021) for Aks .  I, Marye Round, CMA, am acting as scribe for Forest Gleason, MD .   Documentation: I have reviewed the above documentation for accuracy and completeness, and I agree with the above.  Forest Gleason, MD

## 2021-05-21 NOTE — Patient Instructions (Addendum)
Over the counter DerMend Moisturizing Bruise Formula Cream- for brusing on arms   Cryotherapy Aftercare  Wash gently with soap and water everyday.   Apply Vaseline and Band-Aid daily until healed.     If you have any questions or concerns for your doctor, please call our main line at [phone] and press option 4 to reach your doctor's medical assistant. If no one answers, please leave a voicemail as directed and we will return your call as soon as possible. Messages left after 4 pm will be answered the following business day.   You may also send [place] a message via [place]. We typically respond to MyChart messages within 1-2 business days.  For prescription refills, please ask your pharmacy to contact our office. Our fax number is [phone].  If you have an urgent issue when the clinic is closed that cannot wait until the next business day, you can page your doctor at the number below.    Please note that while we do our best to be available for urgent issues outside of office hours, we are not available 24/7.   If you have an urgent issue and are unable to reach [place], you may choose to seek medical care at your doctor's office, retail clinic, urgent care center, or emergency room.  If you have a medical emergency, please immediately call 911 or go to the emergency department.  Pager Numbers  - Dr. Nehemiah Massed: [phone]  - Dr. Laurence Ferrari: [phone]  - Dr. Nicole Kindred: [phone]  In the event of inclement weather, please call our main line at [phone] for an update on the status of any delays or closures.  Dermatology Medication Tips: Please keep the boxes that topical medications come in in order to help keep track of the instructions about where and how to use these. Pharmacies typically print the medication instructions only on the boxes and not directly on the medication tubes.   If your medication is too expensive, please contact our office at [phone] option 4 or send  [place] a message through [place].   We are unable to tell what your co-pay for medications will be in advance as this is different depending on your insurance coverage. However, we may be able to find a substitute medication at lower cost or fill out paperwork to get insurance to cover a needed medication.   If a prior authorization is required to get your medication covered by your insurance company, please allow [place] 1-2 business days to complete this process.  Drug prices often vary depending on where the prescription is filled and some pharmacies may offer cheaper prices.  The website www.goodrx.com contains coupons for medications through different pharmacies. The prices here do not account for what the cost may be with help from insurance (it may be cheaper with your insurance), but the website can give you the price if you did not use any insurance.  - You can print the associated coupon and take it with your prescription to the pharmacy.  - You may also stop by our office during regular business hours and pick up a GoodRx coupon card.  - If you need your prescription sent electronically to a different pharmacy, notify our office through [facility] or by phone at [phone] option 4.

## 2021-05-24 ENCOUNTER — Ambulatory Visit: Admit: 2021-05-24 | Discharge: 2021-05-24 | Payer: PRIVATE HEALTH INSURANCE

## 2021-05-24 DIAGNOSIS — Z944 Liver transplant status: Principal | ICD-10-CM

## 2021-05-24 DIAGNOSIS — D709 Neutropenia, unspecified: Principal | ICD-10-CM

## 2021-05-24 DIAGNOSIS — Z79899 Other long term (current) drug therapy: Principal | ICD-10-CM

## 2021-05-24 DIAGNOSIS — Z23 Encounter for immunization: Principal | ICD-10-CM

## 2021-06-07 ENCOUNTER — Other Ambulatory Visit: Payer: Self-pay | Admitting: *Deleted

## 2021-06-07 ENCOUNTER — Ambulatory Visit: Admit: 2021-06-07 | Discharge: 2021-06-08 | Payer: PRIVATE HEALTH INSURANCE

## 2021-06-07 DIAGNOSIS — N3946 Mixed incontinence: Secondary | ICD-10-CM

## 2021-06-07 MED ORDER — GEMTESA 75 MG PO TABS
75.0000 mg | ORAL_TABLET | Freq: Every day | ORAL | 0 refills | Status: DC
Start: 2021-06-07 — End: 2021-06-24

## 2021-06-18 ENCOUNTER — Ambulatory Visit: Admit: 2021-06-18 | Discharge: 2021-06-19 | Payer: PRIVATE HEALTH INSURANCE

## 2021-06-24 ENCOUNTER — Ambulatory Visit: Payer: BC Managed Care – PPO | Admitting: Urology

## 2021-06-24 ENCOUNTER — Other Ambulatory Visit: Payer: Self-pay

## 2021-06-24 DIAGNOSIS — N3946 Mixed incontinence: Secondary | ICD-10-CM

## 2021-06-24 MED ORDER — GEMTESA 75 MG PO TABS
75.0000 mg | ORAL_TABLET | Freq: Every day | ORAL | 6 refills | Status: DC
Start: 2021-06-24 — End: 2022-02-04

## 2021-06-24 MED ORDER — SULFAMETHOXAZOLE-TRIMETHOPRIM 800-160 MG PO TABS: ORAL_TABLET | ORAL | 0 refills | Status: DC

## 2021-06-24 NOTE — Addendum Note (Signed)
Addended by: Verlene Mayer A on: 06/24/2021 11:42 AM   Modules accepted: Orders

## 2021-06-24 NOTE — Progress Notes (Signed)
06/24/2021 10:56 AM   Barbara Moss 20-May-1956 [id number]  Referring provider: Leonel Ramsay, MD [place],  [place] [address]  Chief Complaint  Patient presents with   Follow-up    HPI: Patient leaks with coughing sneezing.  Primary symptom is urge incontinence.  Running water is a trigger.  She has high-volume bedwetting.  She wears 4 pads during the day that can be quite wet.  She wears 3 pads at night that are moderately wet to soaked.   She voids every 60 to 90 minutes.  She gets up 4 times a night.  Her flow was good.   Since taking Myrbetriq as long she does not drink much fluid her urge incontinence during the day is significantly better but she still has high-volume bedwetting.  She is only getting up twice at night on the Myrbetriq.  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   She has had a hysterectomy bladder suspension years ago.  She has mild intermittent burning.   On pelvic examination patient had grade 1 hypermobility the bladder neck and negative cough test.  Small grade 1 cystocele.  Small grade 1-2 rectocele.  tissues anteriorly were a little bit dry especially in the upper vaginal vault   Patient has mixed incontinence.  She has had a liver transplant.  She has bedwetting. The ankle implant and InterStim likely not good options in the face of liver transplantation.  Some of the antimuscarinics may be less ideal because of her liver transplantation.  It was for hepatitis   Urodynamics ordered.  The patient does feel something in the vagina and she thinks he is feeling a rectocele.  I must say the findings were not very significant.  I will have her bear down again during the cystoscopy.  She is doing the estrogen cream nightly and she will switch to 2-3 times a week.  It will be interesting to see if further estrogen cream helps the symptoms.  She did have some narrowing of the vagina but she is  petite   On pelvic examination little to no prolapse with shortening of the vagina.  She does not have any prolapse to repair.  Estrogen represcribed since prescription ran out.  She did not leak a few drops with coughing  On urodynamics patient voided 133 mL with a maximal flow 25 mils per second.  Residual was a few milliliters.  Maximum bladder capacity was 163 mL.  He had instability at 105 mL with a maximum pressure of 70 cm of water.  At higher volume she leaked a large amount.  She had to be refilled and she had a second contraction to 15 cm water leaking about 20 mL.  It was difficult to assess stress incontinence because of her small capacity and stable bladder.  At 60 mL her leak point pressure was 40 cm of water.  It was 15 cm of water with a Valsalva.  During voluntary voiding she voided 58 mils of maximal with a 15 mils per second.  Max voiding pressure 18 cm water.  Residual 33 mL.  EMG quiet.     Cystoscopy normal   Patient has mixed incontinence.  She has a very small capacity overactive bladder.  Her overactivity frequency was impressive during the study  she does have significant reduced leak point pressures and a bulking agent would be something considered.  I would not be offering her a  sling or mesh especially with her small capacity overactive bladder and liver transplant.  I think sacral nerve stimulation would be safe in the face of being immunocompromise but not the ankle.  Reevaluate in 6 weeks on oxybutynin ER 10 mg 30x11 and proceed accordingly.  The new beta 3 agonist would be a very good choice for her to consider     Today Frequency stable.  I have not seen the patient for over a year. She is improved when she takes oxybutynin reduces her fluids.  She has to drink a lot of fluids and then has not working that well.  She has little bit of dysuria.  This is more recent.    See in 6 weeks on new beta 3 agonist.  If this does not reach her treatment goal we will discuss other  potential options.  Call if urine culture is positive  Today Frequency stable.  Last culture negative Patient is 50% better on the new beta 3 agonist and wants to stay on it.  She still has bedwetting and wears a light pad during the day.  Clinically not infected  We talked about Botox and InterStim and percutaneous tibial nerve stimulation with full template.  She understands increased risk of infection with InterStim due to immunosuppressants.  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   GERD (gastroesophageal reflux disease)    Hand discomfort 12/20/2014   Hearing decreased    History of MRSA infection    left thigh   Hypothyroidism    Iron deficiency anemia 03/19/2015   Sleep apnea 03/19/2015   Spontaneous bacterial peritonitis [facility])     Surgical History: Past Surgical History:  Procedure Laterality Date   ABDOMINAL HYSTERECTOMY     Bladder tack     COLONOSCOPY WITH PROPOFOL N/A 04/24/2016   Procedure: COLONOSCOPY WITH PROPOFOL;  Surgeon: Barbara Silvas, MD;  Location: [facility];  Service: Endoscopy;  Laterality: N/A;   INNER EAR SURGERY Bilateral 03/19/2012   NASAL SINUS SURGERY     SALPINGOOPHORECTOMY     Tympanoplasty with mastoidectomy      Home Medications:  Allergies as of 06/24/2021   No Known Allergies      Medication List        Accurate as of June 24, 2021 10:56 AM. If you have any questions, ask your nurse or doctor.          acetaminophen 500 MG tablet Commonly known as: TYLENOL Take 500 mg by mouth as needed.   calcium-vitamin D 500-200 MG-UNIT tablet Commonly known as: OSCAL WITH D Take 1 tablet by mouth.   Cholecalciferol 50 MCG (2000 UT) Tabs Take 2,000 Units by mouth daily.   Gemtesa 75 MG Tabs Generic drug: Vibegron Take 75 mg by mouth daily.   Golimumab 50 MG/0.5ML  Soaj Inject into the skin.   levothyroxine 88 MCG tablet Commonly known as: SYNTHROID Take 88 mcg by mouth daily before breakfast.   levothyroxine 75 MCG tablet Commonly known as: SYNTHROID Take 75 mcg by mouth daily.   mycophenolate 180 MG EC tablet Commonly known as: MYFORTIC Take 2 tablets by mouth daily.   omeprazole 40 MG capsule Commonly known as: PRILOSEC TAKE 1 CAPSULE BY MOUTH ONCE DAILY FOR REFLUX.   oxybutynin 10 MG 24 hr tablet Commonly known as: DITROPAN-XL  Take by mouth.   oxybutynin 10 MG 24 hr tablet Commonly known as: DITROPAN-XL Take 1 tablet (10 mg total) by mouth daily.   polyethylene glycol powder 17 GM/SCOOP powder Commonly known as: GLYCOLAX/MIRALAX Take 17 g by mouth as needed.   predniSONE 5 MG tablet Commonly known as: DELTASONE Take 1 tablet by mouth daily.   tacrolimus 1 MG capsule Commonly known as: PROGRAF Take 2 capsules by mouth in the morning and at bedtime.   traMADol 50 MG tablet Commonly known as: ULTRAM Take 50 mg by mouth as needed.        Allergies: No Known Allergies  Family History: Family History  Problem Relation Age of Onset   Heart disease Father    Arthritis Sister    Heart disease Mother    Thyroid disease Mother    Cancer Sister    Heart disease Maternal Grandmother    Heart disease Maternal Grandfather    Colon cancer Maternal Aunt    Breast cancer Neg Hx     Social History:  reports that she has quit smoking. She has never used smokeless tobacco. She reports current alcohol use. She reports that she does not use drugs.  ROS:                                        Physical Exam: There were no vitals taken for this visit.  Constitutional:  Alert and oriented, No acute distress. HEENT: [place] AT, moist mucus membranes.  Trachea midline, no masses. Cardiovascular: No clubbing, cyanosis, or edema.   Laboratory Data: Lab Results  Component Value Date   WBC 2.7 (L) 03/09/2018    HGB 10.6 (L) 03/09/2018   HCT 30.0 (L) 03/09/2018   MCV 125.5 (H) 03/09/2018   PLT 71 (L) 03/09/2018    Lab Results  Component Value Date   CREATININE 0.76 03/09/2018    No results found for: PSA  No results found for: TESTOSTERONE  No results found for: HGBA1C  Urinalysis    Component Value Date/Time   COLORURINE AMBER (A) 03/09/2018 1236   APPEARANCEUR Clear 04/01/2021 1134   LABSPEC 1.026 03/09/2018 1236   PHURINE 5.0 03/09/2018 1236   GLUCOSEU Negative 04/01/2021 [place] 03/09/2018 1236   BILIRUBINUR Negative 04/01/2021 1134   KETONESUR 5 (A) 03/09/2018 1236   PROTEINUR Negative 04/01/2021 [place] 03/09/2018 1236   NITRITE Negative 04/01/2021 1134   NITRITE NEGATIVE 03/09/2018 1236   LEUKOCYTESUR Negative 04/01/2021 1134    Pertinent Imaging:   Assessment & Plan: Patient would like to proceed with Botox.  Hopefully this will help her bedwetting as well.  We will proceed accordingly.  Protocol noted  There are no diagnoses linked to this encounter.  No follow-ups on file.  Reece Packer, MD  [facility] [address] [place], [place] [address] [phone]

## 2021-08-08 ENCOUNTER — Telehealth: Payer: Self-pay

## 2021-08-08 NOTE — Telephone Encounter (Signed)
Patient left a message on the triage line requesting an update on the processes for her Botox procedure

## 2021-08-09 ENCOUNTER — Other Ambulatory Visit: Payer: Self-pay

## 2021-08-09 DIAGNOSIS — N3946 Mixed incontinence: Secondary | ICD-10-CM

## 2021-08-09 NOTE — Telephone Encounter (Signed)
[employer] PA per #Nasia G No Auth required for Botox procedure

## 2021-08-12 ENCOUNTER — Other Ambulatory Visit: Payer: Self-pay

## 2021-08-12 ENCOUNTER — Other Ambulatory Visit: Payer: BC Managed Care – PPO

## 2021-08-12 DIAGNOSIS — N3946 Mixed incontinence: Secondary | ICD-10-CM

## 2021-08-14 LAB — MICROSCOPIC EXAMINATION: Bacteria, UA: NONE SEEN

## 2021-08-14 LAB — URINALYSIS, COMPLETE
Bilirubin, UA: NEGATIVE
Glucose, UA: NEGATIVE
Ketones, UA: NEGATIVE
Nitrite, UA: NEGATIVE
Protein,UA: NEGATIVE
Specific Gravity, UA: <1.005 — ABNORMAL LOW (ref 1.005–1.030)
Urobilinogen, Ur: 0.2 mg/dL (ref 0.2–1.0)
pH, UA: 5.5 (ref 5.0–7.5)

## 2021-08-15 LAB — CULTURE, URINE COMPREHENSIVE

## 2021-08-21 ENCOUNTER — Ambulatory Visit: Payer: BC Managed Care – PPO | Admitting: Dermatology

## 2021-08-26 ENCOUNTER — Ambulatory Visit: Payer: BC Managed Care – PPO | Admitting: Urology

## 2021-08-26 ENCOUNTER — Encounter: Payer: Self-pay | Admitting: Urology

## 2021-08-26 ENCOUNTER — Other Ambulatory Visit: Payer: Self-pay

## 2021-08-26 VITALS — BP 121/95 | HR 75 | Ht 59.0 in | Wt 137.0 lb

## 2021-08-26 DIAGNOSIS — N3946 Mixed incontinence: Secondary | ICD-10-CM

## 2021-08-26 MED ORDER — ONABOTULINUMTOXINA 100 UNITS IJ SOLR
100.0000 [IU] | Freq: Once | INTRAMUSCULAR | Status: AC
  Administered 2021-08-26: 100 [IU] via INTRAMUSCULAR

## 2021-08-26 MED ORDER — LIDOCAINE HCL 2 % IJ SOLN
60.0000 mL | Freq: Once | INTRAMUSCULAR | Status: AC
  Administered 2021-08-26: 1200 mg

## 2021-08-26 NOTE — Progress Notes (Signed)
Bladder Instillation  Due to Botox patient is present today for a Bladder Instillation of 19ml Lidocaine 2%. Patient was cleaned and prepped in a sterile fashion with betadine and lidocaine 2% jelly was instilled into the urethra.  A 14FR catheter was inserted, urine return was noted 48ml, urine was yellow in color.  60 ml was instilled into the bladder. The catheter was then removed. Patient tolerated well, no complications were noted Patient held in bladder for 30 minutes prior to procedure starting.   Performed by: Verlene Mayer, [place]

## 2021-08-26 NOTE — Patient Instructions (Signed)
Botulinum Toxin Bladder Injection A botulinum toxin bladder injection is a procedure to treat an overactive bladder. During the procedure, a drug called botulinum toxin is injected into the bladder through a long, thin needle. This drug relaxes the bladder muscles and reduces overactivity. You may need this procedure if your medicines are not working or you cannot take them. The procedure may be repeated as needed. The treatment is done once and it usually lasts for 6 months. Your health care provider will monitor you to see how well you respond. Tell a health care provider about: Any allergies you have. All medicines you are taking, including vitamins, herbs, eye drops, creams, and over-the-counter medicines. Any problems you or family members have had with anesthetic medicines. Any bleeding problems you have. Any surgeries you have had. Any medical conditions you have. Any previous reactions to a botulinum toxin injection. Any symptoms of urinary tract infection. These include chills, fever, a burning feeling when passing urine, and needing to pass urine often. Whether you are pregnant or may be pregnant. What are the risks? Generally this is a safe procedure. However, problems may occur, including: Not being able to pass urine. If this happens, you may need to have your bladder emptied with a thin tube (urinary catheter). Bleeding. Urinary tract infection. Allergic reaction to the botulinum toxin. Pain or burning when passing urine. Damage to nearby structures or organs. What happens before the procedure? When to stop eating and drinking Follow instructions from your health care provider about what you may eat and drink before your procedure. These may include: 8 hours before the procedure Stop eating most foods. Do not eat meat, fried foods, or fatty foods. Eat only light foods, such as toast or crackers. All liquids are okay except energy drinks and alcohol. 6 hours before the  procedure Stop eating. Drink only clear liquids, such as water, clear fruit juice, black coffee, plain tea, and sports drinks. Do not drink energy drinks or alcohol. 2 hours before the procedure Stop drinking all liquids. You may be allowed to take medicines with small sips of water. If you do not follow your health care provider's instructions, your procedure may be delayed or canceled. Medicines Ask your health care provider about: Changing or stopping your regular medicines. This is especially important if you are taking diabetes medicines or blood thinners. Taking medicines such as aspirin and ibuprofen. These medicines can thin your blood. Do not take these medicines unless your health care provider tells you to take them. Taking over-the-counter medicines, vitamins, herbs, and supplements. General instructions Ask your health care provider what steps will be taken to help prevent infection. These steps may include: Removing hair at the procedure site. Washing skin with a germ-killing soap. Taking antibiotic medicine. If you will be going home right after the procedure, plan to have a responsible adult: Take you home from the hospital or clinic. You will not be allowed to drive. Care for you for the time you are told. What happens during the procedure?  You will be asked to empty your bladder. An IV will be inserted into one of your veins. You will be given one or more of the following: A medicine to help you relax (sedative). A medicine to numb the area (local anesthetic). A medicine to make you fall asleep (general anesthetic). A long, thin scope called a cystoscope will be passed into your bladder through the part of the body that carries urine from your bladder (urethra). The cystoscope will  be used to fill your bladder with water. A long needle will be passed through the cystoscope and into the bladder. The botulinum toxin will be injected into your bladder. It may be  injected into multiple areas of your bladder. The cystoscope will be removed and your bladder will be emptied with a urinary catheter. The procedure may vary among health care providers and hospitals. What can I expect after the procedure? After your procedure, it is common to have: Blood-tinged urine. Burning or soreness when you pass urine. Follow these instructions at home: Medicines Take over-the-counter and prescription medicines only as told by your health care provider. If you were prescribed an antibiotic medicine, take it as told by your health care provider. Do not stop using the antibiotic even if you start to feel better. General instructions  If you were given a sedative during the procedure, it can affect you for several hours. Do not drive or operate machinery until your health care provider says that it is safe. Drink enough fluid to keep your urine pale yellow. Return to your normal activities as told by your health care provider. Ask your health care provider what activities are safe for you. Keep all follow-up visits. Contact a health care provider if you have: A fever or chills. Blood-tinged urine for more than one day after your procedure. Worsening pain or burning when you pass urine. Pain or burning when passing urine for more than two days after your procedure. Trouble emptying your bladder. Get help right away if you: Have bright red blood in your urine. Are unable to pass urine. Summary A botulinum toxin bladder injection is a procedure to treat an overactive bladder. This is generally a safe procedure. However, problems may occur, including not being able to pass urine, bleeding, infection, pain, and an allergic reaction to the botulinum toxin. You will be told when to stop eating and drinking, and what medicines to change or stop. Follow instructions carefully. After the procedure, it is common to have blood in your urine and to have soreness or burning when  passing urine. Contact a health care provider if you have a fever, blood in your urine for more than a few days, or trouble passing urine. Get help right away if you have bright red blood in your urine, or if you are unable to pass urine. This information is not intended to replace advice given to you by your health care provider. Make sure you discuss any questions you have with your health care provider. Document Revised: 04/05/2021 Document Reviewed: 04/05/2021 Elsevier Patient Education  [place].

## 2021-08-26 NOTE — Progress Notes (Signed)
08/26/2021 11:08 AM   Barbara Moss 04-18-1956 [id number]  Referring provider: Leonel Ramsay, MD [place],  [place] [address]  Chief Complaint  Patient presents with   Botulinum Toxin Injection    HPI: Patient leaks with coughing sneezing.  Primary symptom is urge incontinence.  Running water is a trigger.  She has high-volume bedwetting.  She wears 4 pads during the day that can be quite wet.  She wears 3 pads at night that are moderately wet to soaked.   She voids every 60 to 90 minutes.  She gets up 4 times a night.  Her flow was good.   Since taking Myrbetriq as long she does not drink much fluid her urge incontinence during the day is significantly better but she still has high-volume bedwetting.  She is only getting up twice at night on the Myrbetriq.  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   She has had a hysterectomy bladder suspension years ago.  She has mild intermittent burning.   On pelvic examination patient had grade 1 hypermobility the bladder neck and negative cough test.  Small grade 1 cystocele.  Small grade 1-2 rectocele.  tissues anteriorly were a little bit dry especially in the upper vaginal vault   Patient has mixed incontinence.  She has had a liver transplant.  She has bedwetting. The ankle implant and InterStim likely not good options in the face of liver transplantation.  Some of the antimuscarinics may be less ideal because of her liver transplantation.  It was for hepatitis   Urodynamics ordered.  The patient does feel something in the vagina and she thinks he is feeling a rectocele.  I must say the findings were not very significant.  I will have her bear down again during the cystoscopy.  She is doing the estrogen cream nightly and she will switch to 2-3 times a week.  It will be interesting to see if further estrogen cream helps the symptoms.  She did have some narrowing of the vagina  but she is petite   On pelvic examination little to no prolapse with shortening of the vagina.  She does not have any prolapse to repair.  Estrogen represcribed since prescription ran out.  She did not leak a few drops with coughing   On urodynamics patient voided 133 mL with a maximal flow 25 mils per second.  Residual was a few milliliters.  Maximum bladder capacity was 163 mL.  He had instability at 105 mL with a maximum pressure of 70 cm of water.  At higher volume she leaked a large amount.  She had to be refilled and she had a second contraction to 15 cm water leaking about 20 mL.  It was difficult to assess stress incontinence because of her small capacity and stable bladder.  At 60 mL her leak point pressure was 40 cm of water.  It was 15 cm of water with a Valsalva.  During voluntary voiding she voided 58 mils of maximal with a 15 mils per second.  Max voiding pressure 18 cm water.  Residual 33 mL.  EMG quiet.     Cystoscopy normal   Patient has mixed incontinence.  She has a very small capacity overactive bladder.  Her overactivity frequency was impressive during the study  she does have significant reduced leak point pressures and a bulking agent would be something considered.  I would not be  offering her a sling or mesh especially with her small capacity overactive bladder and liver transplant.  I think sacral nerve stimulation would be safe in the face of being immunocompromise but not the ankle.  Reevaluate in 6 weeks on oxybutynin ER 10 mg 30x11 and proceed accordingly.  The new beta 3 agonist would be a very good choice for her to consider     Today Frequency stable.  I have not seen the patient for over a year. She is improved when she takes oxybutynin reduces her fluids.  She has to drink a lot of fluids and then has not working that well.  She has little bit of dysuria.  This is more recent.     See in 6 weeks on new beta 3 agonist.  If this does not reach her treatment goal we will  discuss other potential options.  Call if urine culture is positive   Today Frequency stable.  Last culture negative Patient is 50% better on the new beta 3 agonist and wants to stay on it.  She still has bedwetting and wears a light pad during the day.  Clinically not infected  We talked about Botox and InterStim and percutaneous tibial nerve stimulation with full template.  She understands increased risk of infection with InterStim due to immunosuppressants.  3 handouts given    Patient would like to proceed with Botox.  Hopefully this will help her bedwetting as well.  We will proceed accordingly.  Protocol noted  Today Frequency stable.  Incontinence stable.  Clinically not infected  Cystoscopy and Botox: Patient underwent flexible cystoscopy.  Bladder mucosa and trigone were normal.  No cystitis.  I injected botulinum toxin 100 units in 10 cc of normal saline.  I used my higher template as per protocol.  She tolerated very well.  No bleeding.  Lower third of bladder was utilized as well.  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   GERD (gastroesophageal reflux disease)    Hand discomfort 12/20/2014   Hearing decreased    History of MRSA infection    left thigh   Hypothyroidism    Iron deficiency anemia 03/19/2015   Sleep apnea 03/19/2015   Spontaneous bacterial peritonitis [facility])     Surgical History: Past Surgical History:  Procedure Laterality Date   ABDOMINAL HYSTERECTOMY     Bladder tack     COLONOSCOPY WITH PROPOFOL N/A 04/24/2016   Procedure: COLONOSCOPY WITH PROPOFOL;  Surgeon: Manya Silvas, MD;  Location: [facility];  Service: Endoscopy;  Laterality: N/A;   INNER EAR SURGERY Bilateral 03/19/2012   NASAL SINUS SURGERY     SALPINGOOPHORECTOMY     Tympanoplasty with mastoidectomy       Home Medications:  Allergies as of 08/26/2021   No Known Allergies      Medication List        Accurate as of August 26, 2021 11:08 AM. If you have any questions, ask your nurse or doctor.          acetaminophen 500 MG tablet Commonly known as: TYLENOL Take 500 mg by mouth as needed.   calcium-vitamin D 500-200 MG-UNIT tablet Commonly known as: OSCAL WITH D Take 1 tablet by mouth.   Cholecalciferol 50 MCG (2000 UT) Tabs Take 2,000 Units by  mouth daily.   Gemtesa 75 MG Tabs Generic drug: Vibegron Take 75 mg by mouth daily.   Golimumab 50 MG/0.5ML Soaj Inject into the skin.   levothyroxine 88 MCG tablet Commonly known as: SYNTHROID Take 88 mcg by mouth daily before breakfast.   mycophenolate 180 MG EC tablet Commonly known as: MYFORTIC Take 2 tablets by mouth daily.   omeprazole 40 MG capsule Commonly known as: PRILOSEC TAKE 1 CAPSULE BY MOUTH ONCE DAILY FOR REFLUX.   polyethylene glycol powder 17 GM/SCOOP powder Commonly known as: GLYCOLAX/MIRALAX Take 17 g by mouth as needed.   predniSONE 5 MG tablet Commonly known as: DELTASONE Take 1 tablet by mouth daily.   sulfamethoxazole-trimethoprim 800-160 MG tablet Commonly known as: BACTRIM DS Take one tablet one day prior to procedure, day of the procedure, and day after   tacrolimus 1 MG capsule Commonly known as: PROGRAF Take 2 capsules by mouth in the morning and at bedtime.   traMADol 50 MG tablet Commonly known as: ULTRAM Take 50 mg by mouth as needed.        Allergies: No Known Allergies  Family History: Family History  Problem Relation Age of Onset   Heart disease Father    Arthritis Sister    Heart disease Mother    Thyroid disease Mother    Cancer Sister    Heart disease Maternal Grandmother    Heart disease Maternal Grandfather    Colon cancer Maternal Aunt    Breast cancer Neg Hx     Social History:  reports that she has quit smoking. She has never used smokeless  tobacco. She reports current alcohol use. She reports that she does not use drugs.  ROS:                                        Physical Exam: BP (!) 121/95   Pulse 75   Ht 4\' 11"  (1.499 m)   Wt 62.1 kg   BMI 27.67 kg/m   Constitutional:  Alert and oriented, No acute distress. HEENT: [place] AT, moist mucus membranes.  Trachea midline, no masses.  Laboratory Data: Lab Results  Component Value Date   WBC 2.7 (L) 03/09/2018   HGB 10.6 (L) 03/09/2018   HCT 30.0 (L) 03/09/2018   MCV 125.5 (H) 03/09/2018   PLT 71 (L) 03/09/2018    Lab Results  Component Value Date   CREATININE 0.76 03/09/2018    No results found for: PSA  No results found for: TESTOSTERONE  No results found for: HGBA1C  Urinalysis    Component Value Date/Time   COLORURINE AMBER (A) 03/09/2018 1236   APPEARANCEUR Clear 08/12/2021 1358   LABSPEC 1.026 03/09/2018 1236   PHURINE 5.0 03/09/2018 1236   GLUCOSEU Negative 08/12/2021 [place] 03/09/2018 1236   BILIRUBINUR Negative 08/12/2021 1358   KETONESUR 5 (A) 03/09/2018 1236   PROTEINUR Negative 08/12/2021 [place] 03/09/2018 1236   NITRITE Negative 08/12/2021 1358   NITRITE NEGATIVE 03/09/2018 1236   LEUKOCYTESUR Trace (A) 08/12/2021 1358    Pertinent Imaging:   Assessment & Plan: Follow as per protocol  1. Mixed incontinence  - Urinalysis, Complete   No follow-ups on file.  Reece Packer, MD  [place] [address], [place], [place] [address] [phone]

## 2021-08-27 ENCOUNTER — Telehealth: Payer: Self-pay | Admitting: *Deleted

## 2021-08-27 LAB — MICROSCOPIC EXAMINATION: Bacteria, UA: NONE SEEN

## 2021-08-27 LAB — URINALYSIS, COMPLETE
Bilirubin, UA: NEGATIVE
Glucose, UA: NEGATIVE
Ketones, UA: NEGATIVE
Nitrite, UA: NEGATIVE
Protein,UA: NEGATIVE
Specific Gravity, UA: 1.01 (ref 1.005–1.030)
Urobilinogen, Ur: 0.2 mg/dL (ref 0.2–1.0)
pH, UA: 6 (ref 5.0–7.5)

## 2021-08-27 NOTE — Telephone Encounter (Signed)
Called patient post botox-doing well. Had some GI issues, denies any urinary issues.

## 2021-08-29 ENCOUNTER — Other Ambulatory Visit: Payer: Self-pay

## 2021-08-29 ENCOUNTER — Ambulatory Visit: Payer: BC Managed Care – PPO | Admitting: Dermatology

## 2021-08-29 DIAGNOSIS — L578 Other skin changes due to chronic exposure to nonionizing radiation: Secondary | ICD-10-CM

## 2021-08-29 DIAGNOSIS — Z872 Personal history of diseases of the skin and subcutaneous tissue: Secondary | ICD-10-CM

## 2021-08-29 DIAGNOSIS — L821 Other seborrheic keratosis: Secondary | ICD-10-CM

## 2021-08-29 DIAGNOSIS — L988 Other specified disorders of the skin and subcutaneous tissue: Secondary | ICD-10-CM | POA: Diagnosis not present

## 2021-08-29 NOTE — Progress Notes (Signed)
   Follow-Up Visit   Subjective  EPHRATA VERVILLE is a 65 y.o. female who presents for the following: Actinic Keratosis (Of the L forearm x 3 - resolved per patient) and skin lesions (Small bumps on the forehead that patient would like checked today. ).  The following portions of the chart were reviewed this encounter and updated as appropriate:   Tobacco  Allergies  Meds  Problems  Med Hx  Surg Hx  Fam Hx      Review of Systems:  No other skin or systemic complaints except as noted in HPI or Assessment and Plan.  Objective  Well appearing patient in no apparent distress; mood and affect are within normal limits.  A focused examination was performed including the face and arms. Relevant physical exam findings are noted in the Assessment and Plan.  L forearm x 3 Clear.   Face Rhytides and volume loss.    Assessment & Plan  History of actinic keratosis L forearm x 3  Clear. Observe for recurrence. Call clinic for new or changing lesions.  Recommend regular skin exams, daily broad-spectrum spf 30+ sunscreen use, and photoprotection.     Elastosis of skin Face  Discussed prescription Tretinoin Skin Medicinals mix or Altreno QHS.   Topical retinoid medications like tretinoin/Retin-A, adapalene/Differin, tazarotene/Fabior, and Epiduo/Epiduo Forte can cause dryness and irritation when first started. Only apply a pea-sized amount to the entire affected area. Avoid applying it around the eyes, edges of mouth and creases at the nose. If you experience irritation, use a good moisturizer first and/or apply the medicine less often. If you are doing well with the medicine, you can increase how often you use it until you are applying every night. Be careful with sun protection while using this medication as it can make you sensitive to the sun. This medicine should not be used by pregnant women.   Discussed OTC retinol, hyaluronic acid, and Alastin restorative skin complex BID.   Will  send in skin medicinals tretinoin mix.   Actinic Damage - chronic, secondary to cumulative UV radiation exposure/sun exposure over time - diffuse scaly erythematous macules with underlying dyspigmentation - Recommend daily broad spectrum sunscreen SPF 30+ to sun-exposed areas, reapply every 2 hours as needed.  - Recommend staying in the shade or wearing long sleeves, sun glasses (UVA+UVB protection) and wide brim hats (4-inch brim around the entire circumference of the hat). - Call for new or changing lesions.  Seborrheic Keratoses - Stuck-on, waxy, tan-brown papules and/or plaques  - Benign-appearing - Discussed benign etiology and prognosis. - Observe - Call for any changes  Return in about 1 year (around 08/29/2022) for TBSE.  Luther Redo, CMA, am acting as scribe for Forest Gleason, MD .   Documentation: I have reviewed the above documentation for accuracy and completeness, and I agree with the above.  Forest Gleason, MD

## 2021-08-29 NOTE — Patient Instructions (Signed)

## 2021-09-02 ENCOUNTER — Other Ambulatory Visit: Payer: Self-pay | Admitting: Infectious Diseases

## 2021-09-02 DIAGNOSIS — Z1231 Encounter for screening mammogram for malignant neoplasm of breast: Secondary | ICD-10-CM

## 2021-09-09 NOTE — Progress Notes (Signed)
09/10/2021 11:53 AM   Barbara Moss 1956/03/31 [id number]  Referring provider: Leonel Ramsay, MD [place],  [place] [address]  Chief Complaint  Patient presents with   Other   Urological history: 1. Incontinence -contributing factors of age, vaginal atrophy and cystocele    2. Vaginal atrophy -Negative mammogram in November 2021   3. Sleep apnea -untreated   HPI: Barbara Moss is a 65 y.o. female who presents today for follow up after Botox injections.  PVR 12 mL  She underwent Botox injections on 08/26/2021.  Her post-procedural course was as expected and uneventful.      She continues to take the Gemtesa 75 mg daily.    She has noticed a 50% or more improvement in her urinary symptoms.  She states that her pad at night is only damp versus flooded.  She still getting at the same amount of times at night.  And also she has noticed less pads and less leakage during the day as well.  Patient denies any modifying or aggravating factors.  Patient denies any gross hematuria, dysuria or suprapubic/flank pain.  Patient denies any fevers, chills, nausea or vomiting.    PMH: Past Medical History:  Diagnosis Date   Abnormal liver enzymes 03/19/2015   Anemia    Arthritis    Autoimmune hepatitis ([place]) 04/23/2015   Cirrhosis, non-alcoholic ([place]) 06/14/3299   GERD (gastroesophageal reflux disease)    Hand discomfort 12/20/2014   Hearing decreased    History of MRSA infection    left thigh   Hypothyroidism    Iron deficiency anemia 03/19/2015   Sleep apnea 03/19/2015   Spontaneous bacterial peritonitis [facility])     Surgical History: Past Surgical History:  Procedure Laterality Date   ABDOMINAL HYSTERECTOMY     Bladder tack     COLONOSCOPY WITH PROPOFOL N/A 04/24/2016   Procedure: COLONOSCOPY WITH PROPOFOL;  Surgeon: Manya Silvas, MD;  Location: [place] [facility];  Service: Endoscopy;  Laterality: N/A;   INNER EAR SURGERY Bilateral 03/19/2012   NASAL  SINUS SURGERY     SALPINGOOPHORECTOMY     Tympanoplasty with mastoidectomy      Home Medications:  Allergies as of 09/10/2021   No Known Allergies      Medication List        Accurate as of September 10, 2021 11:53 AM. If you have any questions, ask your nurse or doctor.          STOP taking these medications    mycophenolate 180 MG EC tablet Commonly known as: MYFORTIC Stopped by: Arbie Reisz, PA-C   sulfamethoxazole-trimethoprim 800-160 MG tablet Commonly known as: BACTRIM DS Stopped by: Rohaan Durnil, PA-C       TAKE these medications    acetaminophen 500 MG tablet Commonly known as: TYLENOL Take 500 mg by mouth as needed.   calcium-vitamin D 500-200 MG-UNIT tablet Commonly known as: OSCAL WITH D Take 1 tablet by mouth.   Cholecalciferol 50 MCG (2000 UT) Tabs Take 2,000 Units by mouth daily.   Gemtesa 75 MG Tabs Generic drug: Vibegron Take 75 mg by mouth daily.   Golimumab 50 MG/0.5ML Soaj Inject into the skin.   levothyroxine 88 MCG tablet Commonly known as: SYNTHROID Take 88 mcg by mouth daily before breakfast.   omeprazole 40 MG capsule Commonly known as: PRILOSEC TAKE 1 CAPSULE BY MOUTH ONCE DAILY FOR REFLUX.   polyethylene glycol powder 17 GM/SCOOP powder Commonly known as: GLYCOLAX/MIRALAX Take 17 g  by mouth as needed.   predniSONE 5 MG tablet Commonly known as: DELTASONE Take 1 tablet by mouth daily.   tacrolimus 1 MG capsule Commonly known as: PROGRAF Take 2 capsules by mouth in the morning and at bedtime.   traMADol 50 MG tablet Commonly known as: ULTRAM Take 50 mg by mouth as needed.        Allergies: No Known Allergies  Family History: Family History  Problem Relation Age of Onset   Heart disease Father    Arthritis Sister    Heart disease Mother    Thyroid disease Mother    Cancer Sister    Heart disease Maternal Grandmother    Heart disease Maternal Grandfather    Colon cancer Maternal Aunt    Breast  cancer Neg Hx     Social History:  reports that she has quit smoking. She has never used smokeless tobacco. She reports current alcohol use. She reports that she does not use drugs.  ROS: Pertinent ROS in HPI  Physical Exam: BP 107/70   Pulse 80   Ht 4\' 11"  (1.499 m)   Wt 138 lb (62.6 kg)   BMI 27.87 kg/m   Constitutional:  Well nourished. Alert and oriented, No acute distress. HEENT: [place] AT, mask in place.  Trachea midline Cardiovascular: No clubbing, cyanosis, or edema. Respiratory: Normal respiratory effort, no increased work of breathing. Neurologic: Grossly intact, no focal deficits, moving all 4 extremities. Psychiatric: Normal mood and affect.    Laboratory Data: N/A   I have reviewed the labs.   Pertinent Imaging: Component     Latest Ref Rng & Units 09/10/2021  Scan Result      12    Assessment & Plan:    1. Incontinence  -Patient satisfied with the Botox and Gemtesa 75 mg daily   Return in about 5 months (around 02/08/2022) for follow up wtih Dr. Matilde Sprang .  These notes generated with voice recognition software. I apologize for typographical errors.  Zara Council, PA-C  [facility] [address]  [place], [place] [address] [phone]

## 2021-09-10 ENCOUNTER — Ambulatory Visit: Payer: BC Managed Care – PPO | Admitting: Urology

## 2021-09-10 ENCOUNTER — Encounter: Payer: Self-pay | Admitting: Urology

## 2021-09-10 ENCOUNTER — Other Ambulatory Visit: Payer: Self-pay

## 2021-09-10 ENCOUNTER — Encounter: Payer: Self-pay | Admitting: Dermatology

## 2021-09-10 VITALS — BP 107/70 | HR 80 | Ht 59.0 in | Wt 138.0 lb

## 2021-09-10 DIAGNOSIS — N3946 Mixed incontinence: Secondary | ICD-10-CM

## 2021-09-10 LAB — BLADDER SCAN AMB NON-IMAGING: Scan Result: 12

## 2021-09-16 ENCOUNTER — Ambulatory Visit: Admit: 2021-09-16 | Discharge: 2021-09-17 | Payer: PRIVATE HEALTH INSURANCE

## 2021-09-16 DIAGNOSIS — Z944 Liver transplant status: Principal | ICD-10-CM

## 2021-09-16 DIAGNOSIS — E612 Magnesium deficiency: Principal | ICD-10-CM

## 2021-09-16 DIAGNOSIS — Z5181 Encounter for therapeutic drug level monitoring: Principal | ICD-10-CM

## 2021-10-10 DIAGNOSIS — D849 Immunodeficiency, unspecified: Principal | ICD-10-CM

## 2021-10-10 DIAGNOSIS — T8641 Liver transplant rejection: Principal | ICD-10-CM

## 2021-10-10 MED ORDER — PREDNISONE 5 MG TABLET
ORAL_TABLET | Freq: Every day | ORAL | 0 refills | 0.00000 days
Start: 2021-10-10 — End: ?

## 2021-10-14 IMAGING — MG DIGITAL SCREENING BILAT W/ TOMO W/ CAD
8 series · 9 of 24 positions shown · non-contrast
Comparison: Previous exam(s).

CLINICAL DATA: Screening.

EXAM:
DIGITAL SCREENING BILATERAL MAMMOGRAM WITH TOMO AND CAD

[L CC synth-2D]
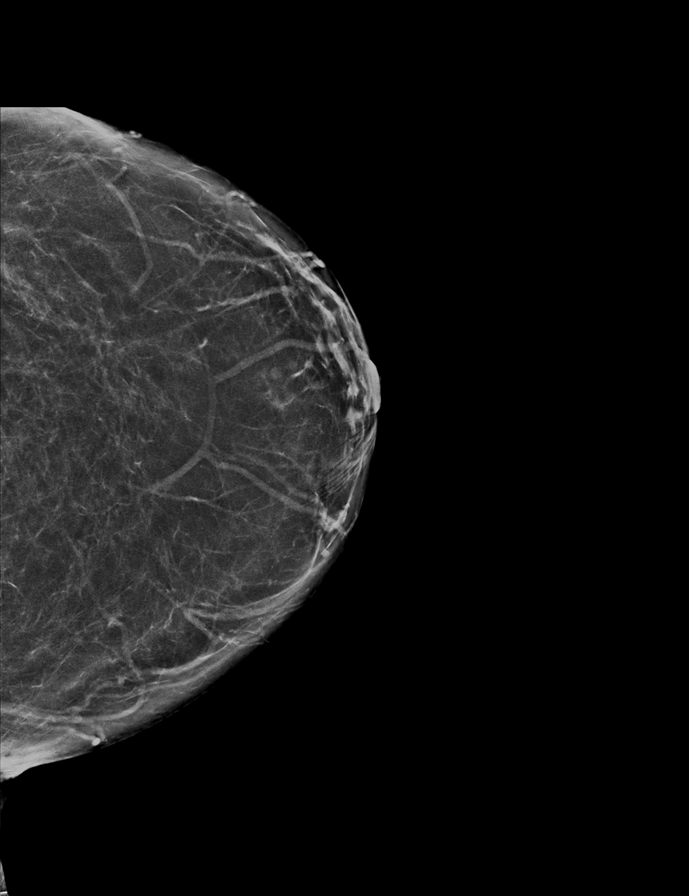

[L MLO synth-2D]
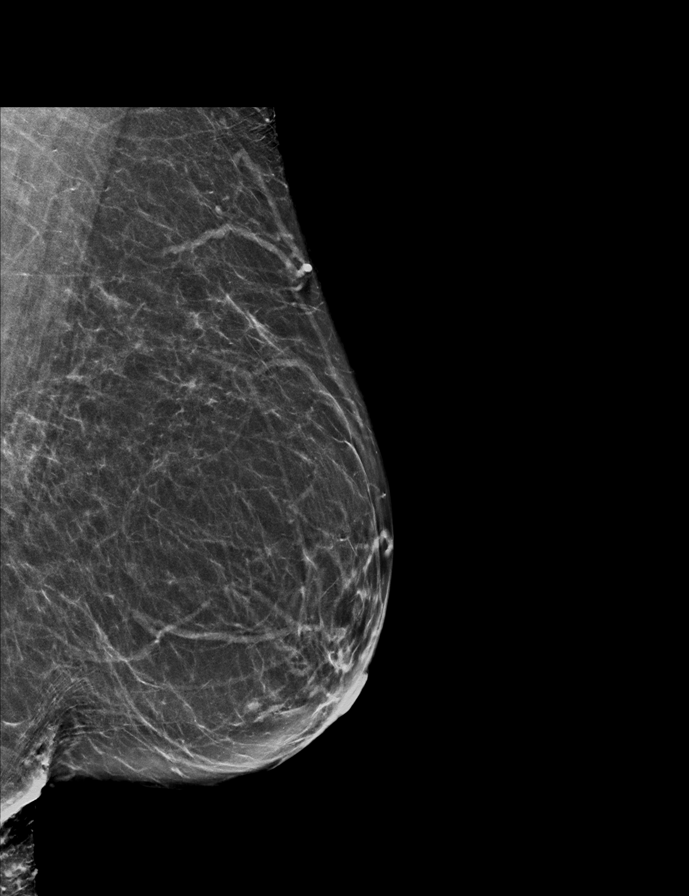

[R MLO synth-2D]
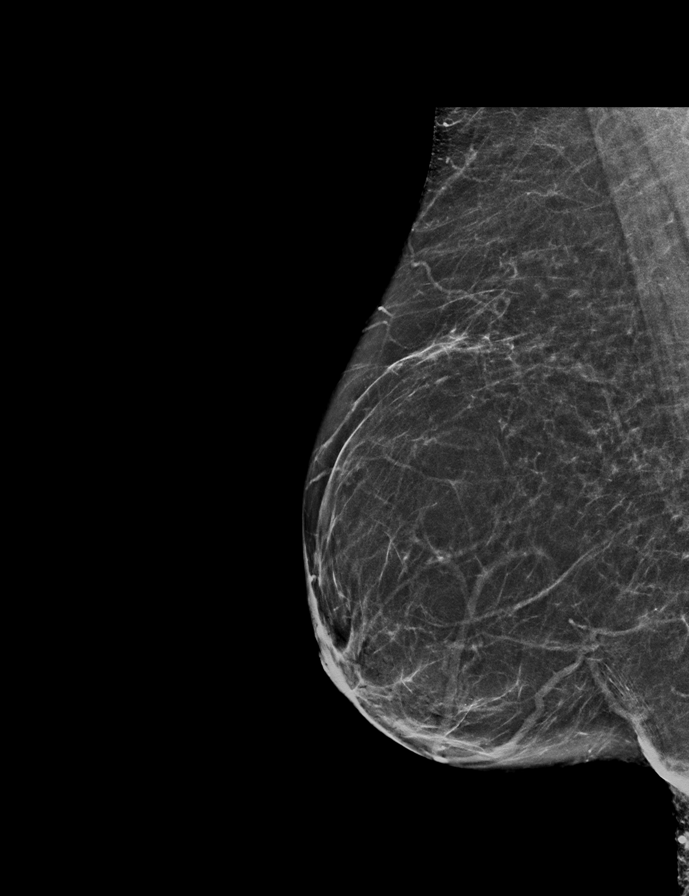

[R CC synth-2D]
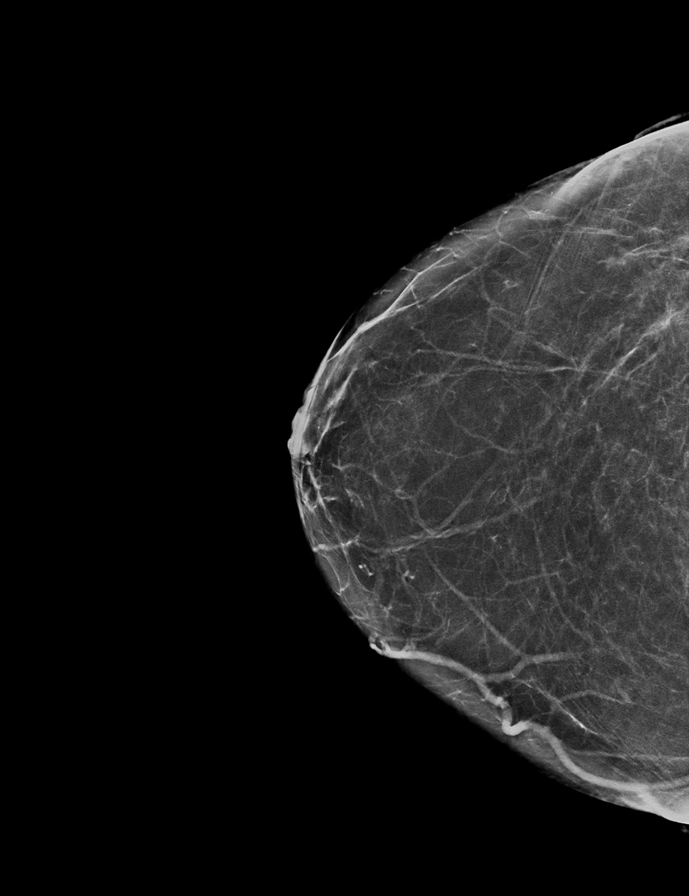

[R CC tomo · 2 of 63 frames shown]
[frame 21/63]
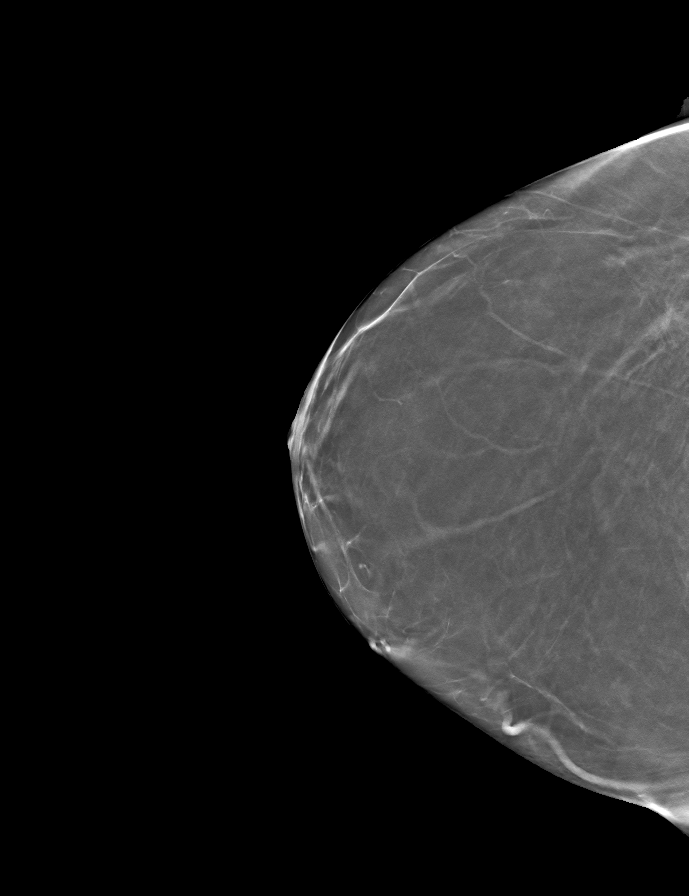
[frame 32/63]
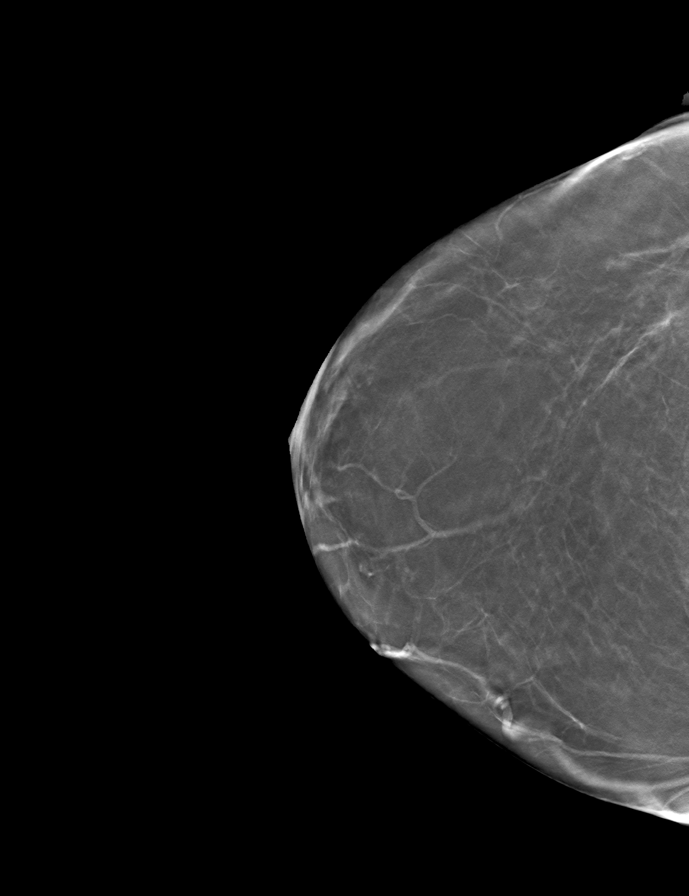

[R MLO tomo · tomo slice 30/59.0]
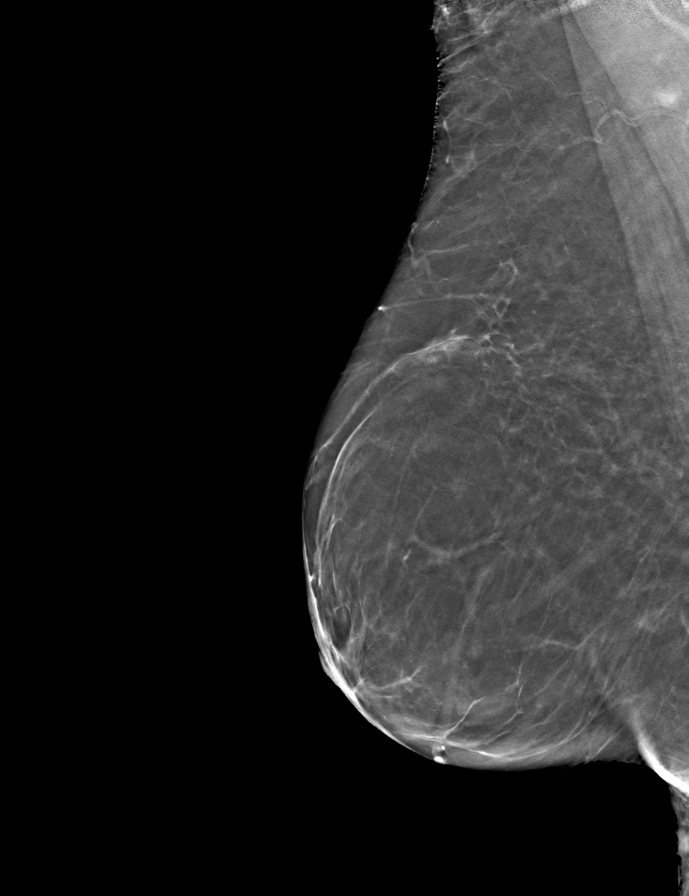

[L CC tomo · tomo slice 31/62.0]
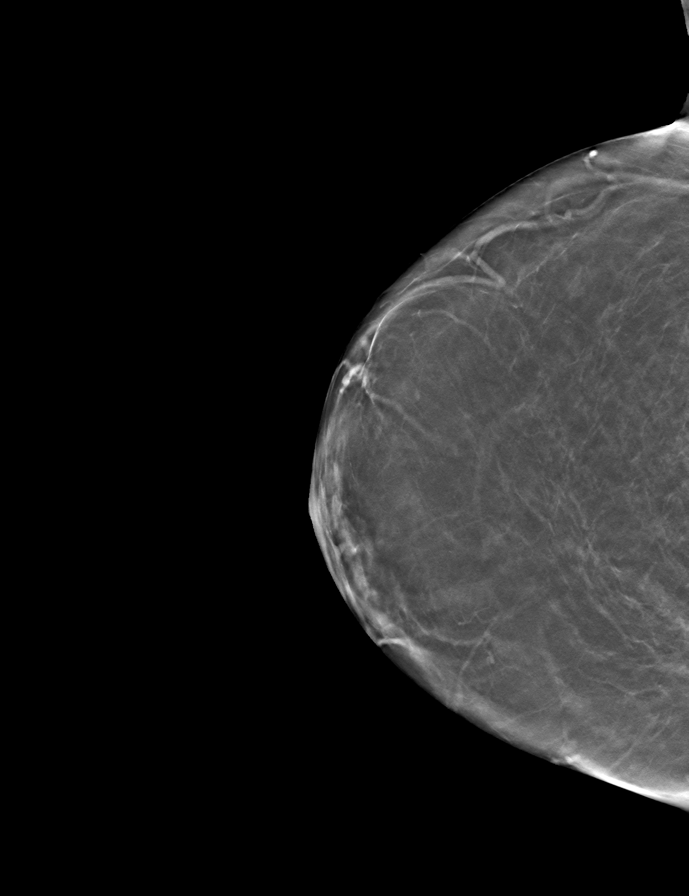

[L MLO tomo · tomo slice 31/62.0]
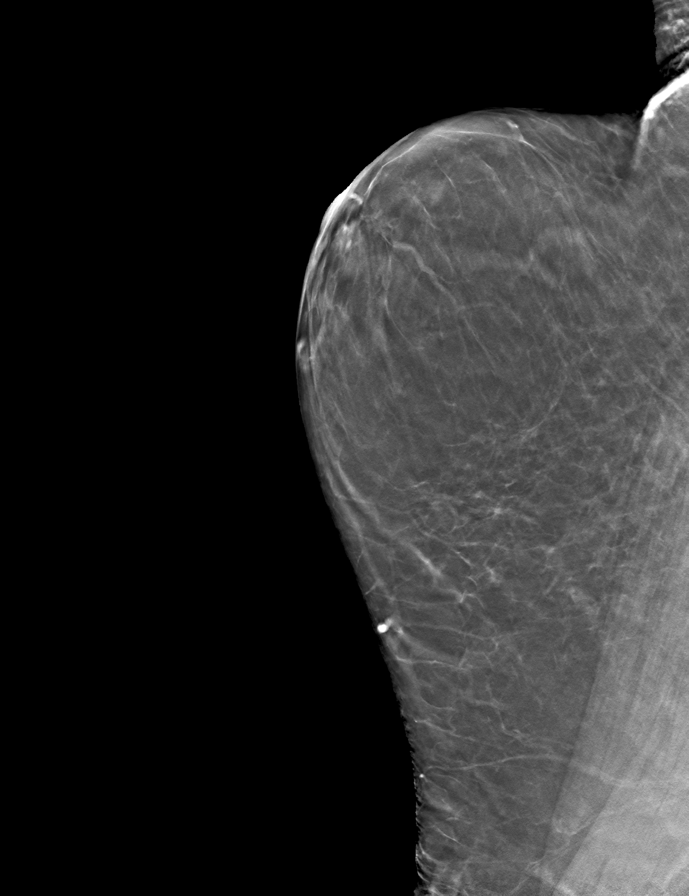

[9 of 24 positions shown; findings below may reference images not displayed]

ACR Breast Density Category b: There are scattered areas of
fibroglandular density.
FINDINGS: There are no findings suspicious for malignancy. Images were
processed with CAD.
IMPRESSION: No mammographic evidence of malignancy. A result letter of this
screening mammogram will be mailed directly to the patient.

RECOMMENDATION:
Screening mammogram in one year. (Code:[id number])

BI-RADS CATEGORY  1: Negative.

## 2021-10-24 DIAGNOSIS — Z796 Long-term use of immunosuppressant medication: Principal | ICD-10-CM

## 2021-10-24 DIAGNOSIS — Z944 Liver transplant status: Principal | ICD-10-CM

## 2021-10-24 MED ORDER — TACROLIMUS 1 MG CAPSULE, IMMEDIATE-RELEASE
ORAL_CAPSULE | Freq: Two times a day (BID) | ORAL | 11 refills | 30 days | Status: CP
Start: 2021-10-24 — End: ?

## 2021-10-24 MED ORDER — PROGRAF 1 MG CAPSULE
ORAL_CAPSULE | Freq: Two times a day (BID) | ORAL | 11 refills | 30 days | Status: CP
Start: 2021-10-24 — End: 2021-10-24

## 2021-10-25 ENCOUNTER — Other Ambulatory Visit: Payer: Self-pay

## 2021-10-25 ENCOUNTER — Ambulatory Visit
Admission: RE | Admit: 2021-10-25 | Discharge: 2021-10-25 | Disposition: A | Discharge status: Home / Self Care | Payer: BC Managed Care – PPO | Source: Ambulatory Visit | Attending: Infectious Diseases | Admitting: Infectious Diseases

## 2021-10-25 DIAGNOSIS — Z1231 Encounter for screening mammogram for malignant neoplasm of breast: Secondary | ICD-10-CM | POA: Insufficient documentation

## 2021-11-20 ENCOUNTER — Ambulatory Visit: Admit: 2021-11-20 | Discharge: 2021-11-21 | Payer: PRIVATE HEALTH INSURANCE

## 2021-12-16 ENCOUNTER — Encounter: Payer: Self-pay | Admitting: Urology

## 2021-12-16 ENCOUNTER — Other Ambulatory Visit: Payer: Self-pay

## 2021-12-16 ENCOUNTER — Ambulatory Visit: Payer: BC Managed Care – PPO | Admitting: Urology

## 2021-12-16 VITALS — BP 152/83 | HR 86 | Ht 59.0 in | Wt 138.0 lb

## 2021-12-16 DIAGNOSIS — N3946 Mixed incontinence: Secondary | ICD-10-CM

## 2021-12-16 DIAGNOSIS — R399 Unspecified symptoms and signs involving the genitourinary system: Secondary | ICD-10-CM

## 2021-12-16 LAB — URINALYSIS, COMPLETE
Bilirubin, UA: NEGATIVE
Glucose, UA: NEGATIVE
Ketones, UA: NEGATIVE
Nitrite, UA: NEGATIVE
Protein,UA: NEGATIVE
Specific Gravity, UA: 1.01 (ref 1.005–1.030)
Urobilinogen, Ur: 0.2 mg/dL (ref 0.2–1.0)
pH, UA: 5.5 (ref 5.0–7.5)

## 2021-12-16 LAB — MICROSCOPIC EXAMINATION

## 2021-12-16 MED ORDER — CIPROFLOXACIN HCL 250 MG PO TABS: 250.0000 mg | ORAL_TABLET | Freq: Two times a day (BID) | ORAL | 0 refills | Status: DC

## 2021-12-16 NOTE — Progress Notes (Signed)
? ?12/16/2021 ?11:05 AM  ? ?Barbara Moss ?May 02, 1956 ?[id number] ? ?Referring provider: Leonel Ramsay, MD ?[place] ?[place],  [place] [address] ? ?Chief Complaint  ?Patient presents with  ? Urinary Tract Infection  ? ? ?HPI: ?I reviewed my lengthy note.  Patient has primarily urge incontinence with high-volume bedwetting.  She leaks some with coughing sneezing.  I felt that an ankle implant and InterStim are not ideal because she has had a liver transplantation.  Some of the antimuscarinics would be less ideal.  ? ?On urodynamics patient voided 133 mL with a maximal flow 25 mils per second.  Residual was a few milliliters.  Maximum bladder capacity was 163 mL.  He had instability at 105 mL with a maximum pressure of 70 cm of water.  At higher volume she leaked a large amount.  She had to be refilled and she had a second contraction to 15 cm water leaking about 20 mL.  It was difficult to assess stress incontinence because of her small capacity and stable bladder.  At 60 mL her leak point pressure was 40 cm of water.  It was 15 cm of water with a Valsalva.  During voluntary voiding she voided 58 mils of maximal with a 15 mils per second.  Max voiding pressure 18 cm water.  Residual 33 mL.  EMG quiet.  ? ?She has had some response to oxybutynin.  She was 50% better on Gemtesa.  We have talked about all 3 refractory treatments and increased risk for infection with InterStim due to immunosuppressants.  She had a Botox treatment August 26, 2021.  She saw a nurse practitioner following and was moderately improved still on Gemtesa.  Postvoid residual was 12 mL ? ?Patient nearly completely dry on Botox and Gemtesa.  No bedwetting.  Wears a pad when she goes out in public for cough and is ? ?Recently had burning.  Urine sent for analysis and culture ? ?PMH: ?Past Medical History:  ?Diagnosis Date  ? Abnormal liver enzymes 03/19/2015  ? Anemia   ? Arthritis   ? Autoimmune hepatitis ([place]) 04/23/2015  ?  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 GERD (gastroesophageal reflux disease)   ? Hand discomfort 12/20/2014  ? Hearing decreased   ? History of MRSA infection   ? left thigh  ? Hypothyroidism   ? Iron deficiency anemia 03/19/2015  ? Sleep apnea 03/19/2015  ? Spontaneous bacterial peritonitis ([place])   ? ? ?Surgical History: ?Past Surgical History:  ?Procedure Laterality Date  ? ABDOMINAL HYSTERECTOMY    ? Bladder tack    ? COLONOSCOPY WITH PROPOFOL N/A 04/24/2016  ? Procedure: COLONOSCOPY WITH PROPOFOL;  Surgeon: Manya Silvas, MD;  Location: [facility] ENDOSCOPY;  Service: Endoscopy;  Laterality: N/A;  ? INNER EAR SURGERY Bilateral 03/19/2012  ? NASAL SINUS SURGERY    ? SALPINGOOPHORECTOMY    ? Tympanoplasty with mastoidectomy    ? ? ?Home Medications:  ?Allergies as of 12/16/2021   ?No Known Allergies ?  ? ?  ?Medication List  ?  ? ?  ? Accurate as of December 16, 2021 11:05 AM. If you have any questions, ask your nurse or doctor.  ?  ?  ? ?  ? ?acetaminophen 500 MG tablet ?Commonly known as: TYLENOL ?Take 500 mg by mouth as needed. ?  ?calcium-vitamin D 500-200 MG-UNIT tablet ?Commonly known as: OSCAL WITH D ?Take 1 tablet by mouth. ?  ?Cholecalciferol 50 MCG (2000 UT) Tabs ?Take 2,000 Units by  mouth daily. ?  ?Gemtesa 75 MG Tabs ?Generic drug: Vibegron ?Take 75 mg by mouth daily. ?  ?Golimumab 50 MG/0.5ML Soaj ?Inject into the skin. ?  ?levothyroxine 88 MCG tablet ?Commonly known as: SYNTHROID ?Take 88 mcg by mouth daily before breakfast. ?  ?omeprazole 40 MG capsule ?Commonly known as: PRILOSEC ?TAKE 1 CAPSULE BY MOUTH ONCE DAILY FOR REFLUX. ?  ?polyethylene glycol powder 17 GM/SCOOP powder ?Commonly known as: GLYCOLAX/MIRALAX ?Take 17 g by mouth as needed. ?  ?predniSONE 5 MG tablet ?Commonly known as: DELTASONE ?Take 1 tablet by mouth daily. ?  ?tacrolimus 1 MG capsule ?Commonly known as: PROGRAF ?Take 2 capsules by mouth in the morning and at bedtime. ?  ?traMADol 50 MG tablet ?Commonly known as: ULTRAM ?Take 50 mg by  mouth as needed. ?  ? ?  ? ? ?Allergies: No Known Allergies ? ?Family History: ?Family History  ?Problem Relation Age of Onset  ? Heart disease Father   ? Arthritis Sister   ? Heart disease Mother   ? Thyroid disease Mother   ? Cancer Sister   ? Heart disease Maternal Grandmother   ? Heart disease Maternal Grandfather   ? Colon cancer Maternal Aunt   ? Breast cancer Neg Hx   ? ? ?Social History:  reports that she has quit smoking. She has never used smokeless tobacco. She reports current alcohol use. She reports that she does not use drugs. ? ?ROS: ?  ? ?  ? ?  ? ?  ? ?  ? ?  ? ?  ? ?  ? ?  ? ?  ? ?  ? ?  ? ?  ? ?Physical Exam: ?BP (!) 152/83   Pulse 86   Ht '4\' 11"'$  (1.499 m)   Wt 62.6 kg   BMI 27.87 kg/m?   ?Constitutional:  Alert and oriented, No acute distress. ?HEENT: [place] AT, moist mucus membranes.  Trachea midline, no masses. ? ?Laboratory Data: ?Lab Results  ?Component Value Date  ? WBC 2.7 (L) 03/09/2018  ? HGB 10.6 (L) 03/09/2018  ? HCT 30.0 (L) 03/09/2018  ? MCV 125.5 (H) 03/09/2018  ? PLT 71 (L) 03/09/2018  ? ? ?Lab Results  ?Component Value Date  ? CREATININE 0.76 03/09/2018  ? ? ?No results found for: PSA ? ?No results found for: TESTOSTERONE ? ?No results found for: HGBA1C ? ?Urinalysis ?   ?Component Value Date/Time  ? COLORURINE AMBER (A) 03/09/2018 1236  ? APPEARANCEUR Clear 08/26/2021 1047  ? LABSPEC 1.026 03/09/2018 1236  ? PHURINE 5.0 03/09/2018 1236  ? GLUCOSEU Negative 08/26/2021 1047  ? [place] NEGATIVE 03/09/2018 1236  ? BILIRUBINUR Negative 08/26/2021 1047  ? KETONESUR 5 (A) 03/09/2018 1236  ? PROTEINUR Negative 08/26/2021 1047  ? [place] NEGATIVE 03/09/2018 1236  ? NITRITE Negative 08/26/2021 1047  ? NITRITE NEGATIVE 03/09/2018 1236  ? LEUKOCYTESUR Trace (A) 08/26/2021 1047  ? ? ?Pertinent Imaging: ? ? ?Assessment & Plan:: Ciprofloxacin 250 mg twice a day for 7 days for possible infection.  Reevaluate Botox and Gemtesa in 3 months.  Call if culture differs ? ?1. UTI symptoms ? ?-  Urinalysis, Complete ? ? ?No follow-ups on file. ? ?Reece Packer, MD ? ?[facility] ?[address] ?[place], [place] [address] ?[phone]  ?

## 2021-12-19 LAB — CULTURE, URINE COMPREHENSIVE

## 2022-02-04 ENCOUNTER — Other Ambulatory Visit: Payer: Self-pay | Admitting: *Deleted

## 2022-02-04 DIAGNOSIS — N3946 Mixed incontinence: Secondary | ICD-10-CM

## 2022-02-04 MED ORDER — GEMTESA 75 MG PO TABS: 75.0000 mg | ORAL_TABLET | Freq: Every day | ORAL | 6 refills | Status: DC

## 2022-02-11 ENCOUNTER — Ambulatory Visit: Admit: 2022-02-11 | Discharge: 2022-02-12 | Payer: PRIVATE HEALTH INSURANCE

## 2022-03-24 ENCOUNTER — Ambulatory Visit: Payer: BC Managed Care – PPO | Admitting: Urology

## 2022-03-24 VITALS — BP 137/81 | HR 80 | Ht 59.0 in | Wt 143.0 lb

## 2022-03-24 DIAGNOSIS — N3946 Mixed incontinence: Secondary | ICD-10-CM

## 2022-03-24 MED ORDER — GEMTESA 75 MG PO TABS: 75.0000 mg | ORAL_TABLET | Freq: Every day | ORAL | 11 refills | Status: DC

## 2022-03-24 NOTE — Progress Notes (Signed)
03/24/2022 10:47 AM   [name] 1955-10-17 [id number]  Referring provider: Leonel Ramsay, MD [place],  [place] [address]  Chief Complaint  Patient presents with   Urinary Incontinence    HPI: I reviewed my lengthy note.  Patient has primarily urge incontinence with high-volume bedwetting.  She leaks some with coughing sneezing.  I felt that an ankle implant and InterStim are not ideal because she has had a liver transplantation.  Some of the antimuscarinics would be less ideal.    On urodynamics patient voided 133 mL with a maximal flow 25 mils per second.  Residual was a few milliliters.  Maximum bladder capacity was 163 mL.  He had instability at 105 mL with a maximum pressure of 70 cm of water.  At higher volume she leaked a large amount.  She had to be refilled and she had a second contraction to 15 cm water leaking about 20 mL.  It was difficult to assess stress incontinence because of her small capacity and stable bladder.  At 60 mL her leak point pressure was 40 cm of water.  It was 15 cm of water with a Valsalva.  During voluntary voiding she voided 58 mils of maximal with a 15 mils per second.  Max voiding pressure 18 cm water.  Residual 33 mL.  EMG quiet.    She has had some response to oxybutynin.  She was 50% better on Gemtesa.  We have talked about all 3 refractory treatments and increased risk for infection with InterStim due to immunosuppressants.  She had a Botox treatment August 26, 2021.  She saw a nurse practitioner following and was moderately improved still on Gemtesa.  Postvoid residual was 12 mL   Patient nearly completely dry on Botox and Gemtesa.  No bedwetting.  Wears a pad when she goes out in public for cough and is  Recently had burning.  Urine sent for analysis and culture    Ciprofloxacin 250 mg twice a day for 7 days for possible infection.  Reevaluate Botox and Gemtesa in 3 months.  Call if culture differs    Today Frequency stable and last culture negative.  Last Botox was on August 26, 2021. Clinically not infected.  Continent during the day.  Starting to have foot on the floor syndrome and mild bedwetting.  On Gemtesa.   PMH: Past Medical History:  Diagnosis Date   Abnormal liver enzymes 03/19/2015   Anemia    Arthritis    Autoimmune hepatitis ([place]) 04/23/2015   Cirrhosis, non-alcoholic ([place]) 12/18/8586   GERD (gastroesophageal reflux disease)    Hand discomfort 12/20/2014   Hearing decreased    History of MRSA infection    left thigh   Hypothyroidism    Iron deficiency anemia 03/19/2015   Sleep apnea 03/19/2015   Spontaneous bacterial peritonitis [facility])     Surgical History: Past Surgical History:  Procedure Laterality Date   ABDOMINAL HYSTERECTOMY     Bladder tack     COLONOSCOPY WITH PROPOFOL N/A 04/24/2016   Procedure: COLONOSCOPY WITH PROPOFOL;  Surgeon: Manya Silvas, MD;  Location: [place];  Service: Endoscopy;  Laterality: N/A;   INNER EAR SURGERY Bilateral 03/19/2012   NASAL SINUS SURGERY     SALPINGOOPHORECTOMY     Tympanoplasty with mastoidectomy      Home Medications:  Allergies as of 03/24/2022   No Known Allergies      Medication List        Accurate  as of March 24, 2022 10:47 AM. If you have any questions, ask your nurse or doctor.          acetaminophen 500 MG tablet Commonly known as: TYLENOL Take 500 mg by mouth as needed.   calcium-vitamin D 500-200 MG-UNIT tablet Commonly known as: OSCAL WITH D Take 1 tablet by mouth.   Cholecalciferol 50 MCG (2000 UT) Tabs Take 2,000 Units by mouth daily.   ciprofloxacin 250 MG tablet Commonly known as: Cipro Take 1 tablet (250 mg total) by mouth 2 (two) times daily.   Gemtesa 75 MG Tabs Generic drug: Vibegron Take 75 mg by mouth daily.   Golimumab 50 MG/0.5ML Soaj Inject into the skin.   levothyroxine 88 MCG tablet Commonly known as: SYNTHROID Take 88 mcg by mouth daily before breakfast.    omeprazole 40 MG capsule Commonly known as: PRILOSEC TAKE 1 CAPSULE BY MOUTH ONCE DAILY FOR REFLUX.   polyethylene glycol powder 17 GM/SCOOP powder Commonly known as: GLYCOLAX/MIRALAX Take 17 g by mouth as needed.   predniSONE 5 MG tablet Commonly known as: DELTASONE Take 1 tablet by mouth daily.   tacrolimus 1 MG capsule Commonly known as: PROGRAF Take 2 capsules by mouth in the morning and at bedtime.   traMADol 50 MG tablet Commonly known as: ULTRAM Take 50 mg by mouth as needed.        Allergies: No Known Allergies  Family History: Family History  Problem Relation Age of Onset   Heart disease Father    Arthritis Sister    Heart disease Mother    Thyroid disease Mother    Cancer Sister    Heart disease Maternal Grandmother    Heart disease Maternal Grandfather    Colon cancer Maternal Aunt    Breast cancer Neg Hx     Social History:  reports that she has quit smoking. She has never used smokeless tobacco. She reports current alcohol use. She reports that she does not use drugs.  ROS:                                        Physical Exam: There were no vitals taken for this visit.  Constitutional:  Alert and oriented, No acute distress.   Laboratory Data: Lab Results  Component Value Date   WBC 2.7 (L) 03/09/2018   HGB 10.6 (L) 03/09/2018   HCT 30.0 (L) 03/09/2018   MCV 125.5 (H) 03/09/2018   PLT 71 (L) 03/09/2018    Lab Results  Component Value Date   CREATININE 0.76 03/09/2018    No results found for: "PSA"  No results found for: "TESTOSTERONE"  No results found for: "HGBA1C"  Urinalysis    Component Value Date/Time   COLORURINE AMBER (A) 03/09/2018 1236   APPEARANCEUR Clear 12/16/2021 1100   LABSPEC 1.026 03/09/2018 1236   PHURINE 5.0 03/09/2018 1236   GLUCOSEU Negative 12/16/2021 1100   HGBUR NEGATIVE 03/09/2018 1236   BILIRUBINUR Negative 12/16/2021 1100   KETONESUR 5 (A) 03/09/2018 1236   PROTEINUR  Negative 12/16/2021 1100   PROTEINUR NEGATIVE 03/09/2018 1236   NITRITE Negative 12/16/2021 1100   NITRITE NEGATIVE 03/09/2018 1236   LEUKOCYTESUR Trace (A) 12/16/2021 1100    Pertinent Imaging:   Assessment & Plan: Schedule Botox.  Renew Gemtesa 30x11.  3-day prescription ciprofloxacin sent for Botox.  Follow-up protocol  There are no diagnoses linked to this encounter.  No follow-ups on file.  Reece Packer, MD  [facility] [address] [place], [place] [address] [phone]

## 2022-03-24 NOTE — Addendum Note (Signed)
Addended by: Evelina Bucy on: 03/24/2022 11:16 AM   Modules accepted: Orders

## 2022-03-28 ENCOUNTER — Telehealth: Payer: Self-pay | Admitting: *Deleted

## 2022-03-28 DIAGNOSIS — R399 Unspecified symptoms and signs involving the genitourinary system: Secondary | ICD-10-CM

## 2022-03-28 DIAGNOSIS — N3946 Mixed incontinence: Secondary | ICD-10-CM

## 2022-03-28 MED ORDER — CIPROFLOXACIN HCL 250 MG PO TABS: ORAL_TABLET | ORAL | 0 refills | Status: DC

## 2022-03-28 NOTE — Telephone Encounter (Signed)
JEWEL Chakraborty ([facility]) - [id number] Botox 100UNIT solution Status: PA Response - ApprovedCreated: June 16th, 2023Sent: June 16th, 2023

## 2022-03-28 NOTE — Telephone Encounter (Signed)
Scheduled patient's Botox procedure, lab visit and sent in Cipro to Pharmacy. Procedure reviewed in detail and voiced understanding.

## 2022-04-16 ENCOUNTER — Ambulatory Visit: Admit: 2022-04-16 | Discharge: 2022-04-17 | Payer: PRIVATE HEALTH INSURANCE

## 2022-04-21 ENCOUNTER — Ambulatory Visit: Admit: 2022-04-21 | Discharge: 2022-04-22 | Payer: PRIVATE HEALTH INSURANCE

## 2022-04-21 DIAGNOSIS — Z944 Liver transplant status: Principal | ICD-10-CM

## 2022-04-21 DIAGNOSIS — E039 Hypothyroidism, unspecified: Principal | ICD-10-CM

## 2022-04-21 DIAGNOSIS — D509 Iron deficiency anemia, unspecified: Principal | ICD-10-CM

## 2022-06-09 ENCOUNTER — Other Ambulatory Visit: Payer: BC Managed Care – PPO

## 2022-06-23 ENCOUNTER — Ambulatory Visit: Payer: BC Managed Care – PPO | Admitting: Urology

## 2022-07-08 ENCOUNTER — Other Ambulatory Visit: Payer: BC Managed Care – PPO

## 2022-07-15 ENCOUNTER — Ambulatory Visit: Admit: 2022-07-15 | Discharge: 2022-07-16 | Payer: PRIVATE HEALTH INSURANCE

## 2022-07-28 ENCOUNTER — Other Ambulatory Visit: Payer: BC Managed Care – PPO

## 2022-07-28 DIAGNOSIS — N3946 Mixed incontinence: Secondary | ICD-10-CM

## 2022-07-28 DIAGNOSIS — R399 Unspecified symptoms and signs involving the genitourinary system: Secondary | ICD-10-CM

## 2022-07-28 LAB — URINALYSIS, COMPLETE
Bilirubin, UA: NEGATIVE
Glucose, UA: NEGATIVE
Ketones, UA: NEGATIVE
Nitrite, UA: NEGATIVE
Specific Gravity, UA: 1.015 (ref 1.005–1.030)
Urobilinogen, Ur: 0.2 mg/dL (ref 0.2–1.0)
pH, UA: 5.5 (ref 5.0–7.5)

## 2022-07-28 LAB — MICROSCOPIC EXAMINATION: Bacteria, UA: NONE SEEN

## 2022-07-31 LAB — CULTURE, URINE COMPREHENSIVE

## 2022-08-04 ENCOUNTER — Encounter: Payer: Self-pay | Admitting: Urology

## 2022-08-04 ENCOUNTER — Ambulatory Visit: Payer: BC Managed Care – PPO | Admitting: Urology

## 2022-08-04 VITALS — BP 148/88 | HR 84 | Ht 59.0 in | Wt 143.0 lb

## 2022-08-04 DIAGNOSIS — N3946 Mixed incontinence: Secondary | ICD-10-CM

## 2022-08-04 DIAGNOSIS — Z9229 Personal history of other drug therapy: Secondary | ICD-10-CM

## 2022-08-04 MED ORDER — ONABOTULINUMTOXINA 100 UNITS IJ SOLR
100.0000 [IU] | Freq: Once | INTRAMUSCULAR | Status: AC
  Administered 2022-08-04: 100 [IU] via INTRAMUSCULAR

## 2022-08-04 NOTE — Progress Notes (Signed)
08/04/2022 10:03 AM   [name] 1956-07-29 [id number]  Referring provider: Leonel Ramsay, MD [place],  [place] [address]  Chief Complaint  Patient presents with   Botulinum Toxin Injection    HPI:  reviewed my lengthy note.  Patient has primarily urge incontinence with high-volume bedwetting.  She leaks some with coughing sneezing.  I felt that an ankle implant and InterStim are not ideal because she has had a liver transplantation.  Some of the antimuscarinics would be less ideal.    On urodynamics patient voided 133 mL with a maximal flow 25 mils per second.  Residual was a few milliliters.  Maximum bladder capacity was 163 mL.  He had instability at 105 mL with a maximum pressure of 70 cm of water.  At higher volume she leaked a large amount.  She had to be refilled and she had a second contraction to 15 cm water leaking about 20 mL.  It was difficult to assess stress incontinence because of her small capacity and stable bladder.  At 60 mL her leak point pressure was 40 cm of water.  It was 15 cm of water with a Valsalva.  During voluntary voiding she voided 58 mils of maximal with a 15 mils per second.  Max voiding pressure 18 cm water.  Residual 33 mL.  EMG quiet.    She has had some response to oxybutynin.  She was 50% better on Gemtesa.  We have talked about all 3 refractory treatments and increased risk for infection with InterStim due to immunosuppressants.  She had a Botox treatment August 26, 2021.  She saw a nurse practitioner following and was moderately improved still on Gemtesa.  Postvoid residual was 12 mL   Patient nearly completely dry on Botox and Gemtesa.  No bedwetting.  Wears a pad when she goes out in public for cough and is  Ciprofloxacin 250 mg twice a day for 7 days for possible infection.  Reevaluate Botox and Gemtesa in 3 months.    Last culture negative.  Last Botox was on August 26, 2021. Clinically not infected.  Continent  during the day.  Starting to have foot on the floor syndrome and mild bedwetting.  On Gemtesa.Schedule Botox.  Renew Gemtesa 30x11.  3-day prescription ciprofloxacin sent for Botox.  Follow-up protocol     Today We will having some foot on the floor syndrome and mild bedwetting.  Starting to get more urgent during the day.  Clinically not infected  Cystoscopy: Patient underwent flexible cystoscopy with injection of Botox.  100 units and 10 cc in normal saline utilized.  The template was in the lower third of the bladder and in the posterior wall more to the patient's left and midline and some on the right.  She tolerated it very well.  No bleeding.  Bladder mucosa and trigone were normal.  No cystitis.  Urine looked clear  PMH: Past Medical History:  Diagnosis Date   Abnormal liver enzymes 03/19/2015   Anemia    Arthritis    Autoimmune hepatitis ([place]) 04/23/2015   Cirrhosis, non-alcoholic ([facility]) 02/13/6143   GERD (gastroesophageal reflux disease)    Hand discomfort 12/20/2014   Hearing decreased    History of MRSA infection    left thigh   Hypothyroidism    Iron deficiency anemia 03/19/2015   Sleep apnea 03/19/2015   Spontaneous bacterial peritonitis [facility])     Surgical History: Past Surgical History:  Procedure Laterality Date  ABDOMINAL HYSTERECTOMY     Bladder tack     COLONOSCOPY WITH PROPOFOL N/A 04/24/2016   Procedure: COLONOSCOPY WITH PROPOFOL;  Surgeon: Manya Silvas, MD;  Location: [facility] ENDOSCOPY;  Service: Endoscopy;  Laterality: N/A;   INNER EAR SURGERY Bilateral 03/19/2012   NASAL SINUS SURGERY     SALPINGOOPHORECTOMY     Tympanoplasty with mastoidectomy      Home Medications:  Allergies as of 08/04/2022   No Known Allergies      Medication List        Accurate as of August 04, 2022 10:03 AM. If you have any questions, ask your nurse or doctor.          acetaminophen 500 MG tablet Commonly known as: TYLENOL Take 500 mg by mouth as needed.    calcium-vitamin D 500-200 MG-UNIT tablet Commonly known as: OSCAL WITH D Take 1 tablet by mouth.   Cholecalciferol 50 MCG (2000 UT) Tabs Take 2,000 Units by mouth daily.   ciprofloxacin 250 MG tablet Commonly known as: Cipro Take 2 tablets the day before procedure, day of, and day after   Gemtesa 75 MG Tabs Generic drug: Vibegron Take 75 mg by mouth daily.   Golimumab 50 MG/0.5ML Soaj Inject into the skin.   levothyroxine 88 MCG tablet Commonly known as: SYNTHROID Take 88 mcg by mouth daily before breakfast.   omeprazole 40 MG capsule Commonly known as: PRILOSEC TAKE 1 CAPSULE BY MOUTH ONCE DAILY FOR REFLUX.   polyethylene glycol powder 17 GM/SCOOP powder Commonly known as: GLYCOLAX/MIRALAX Take 17 g by mouth as needed.   predniSONE 5 MG tablet Commonly known as: DELTASONE Take 1 tablet by mouth daily.   Prolia 60 MG/ML Sosy injection Generic drug: denosumab Inject into the skin.   tacrolimus 1 MG capsule Commonly known as: PROGRAF Take 2 capsules by mouth in the morning and at bedtime.   traMADol 50 MG tablet Commonly known as: ULTRAM Take 50 mg by mouth as needed.        Allergies: No Known Allergies  Family History: Family History  Problem Relation Age of Onset   Heart disease Father    Arthritis Sister    Heart disease Mother    Thyroid disease Mother    Cancer Sister    Heart disease Maternal Grandmother    Heart disease Maternal Grandfather    Colon cancer Maternal Aunt    Breast cancer Neg Hx     Social History:  reports that she has quit smoking. She has never been exposed to tobacco smoke. She has never used smokeless tobacco. She reports current alcohol use. She reports that she does not use drugs.  ROS:                                        Physical Exam: There were no vitals taken for this visit.  Constitutional:  Alert and oriented, No acute distress. HEENT: [place] AT, moist mucus membranes.  Trachea  midline, no masses.   Laboratory Data: Lab Results  Component Value Date   WBC 2.7 (L) 03/09/2018   HGB 10.6 (L) 03/09/2018   HCT 30.0 (L) 03/09/2018   MCV 125.5 (H) 03/09/2018   PLT 71 (L) 03/09/2018    Lab Results  Component Value Date   CREATININE 0.76 03/09/2018    No results found for: "PSA"  No results found for: "TESTOSTERONE"  No results  found for: "HGBA1C"  Urinalysis    Component Value Date/Time   COLORURINE AMBER (A) 03/09/2018 1236   APPEARANCEUR Clear 07/28/2022 1006   LABSPEC 1.026 03/09/2018 1236   PHURINE 5.0 03/09/2018 1236   GLUCOSEU Negative 07/28/2022 1006   HGBUR NEGATIVE 03/09/2018 1236   BILIRUBINUR Negative 07/28/2022 1006   KETONESUR 5 (A) 03/09/2018 1236   PROTEINUR 1+ (A) 07/28/2022 1006   PROTEINUR NEGATIVE 03/09/2018 1236   NITRITE Negative 07/28/2022 1006   NITRITE NEGATIVE 03/09/2018 1236   LEUKOCYTESUR 1+ (A) 07/28/2022 1006    Pertinent Imaging:   Assessment & Plan: Follow-up as per protocol on Botox and Gemtesa.  There are no diagnoses linked to this encounter.  No follow-ups on file.  Reece Packer, MD  [facility] [address], [place], [place] [address] [phone]

## 2022-08-04 NOTE — Addendum Note (Signed)
Addended by: Despina Hidden on: 08/04/2022 11:17 AM   Modules accepted: Orders

## 2022-08-04 NOTE — Progress Notes (Signed)
Patient ID: Barbara Moss, female   DOB: Dec 12, 1955, 66 y.o.   MRN: [id number] Bladder Instillation  Due to Botox patient is present today for a Bladder Instillation of Botox. Patient was cleaned and prepped in a sterile fashion with betadine and lidocaine 2% jelly was instilled into the urethra.  A 16FR catheter was inserted, urine return was noted 15m, urine was clear in color.  60 ml was instilled into the bladder. The catheter was then removed. Patient tolerated well, no complications were noted. Patient held in bladder for 30 minutes prior to procedure starting.   Performed by: DEdwin Dada CMA  Follow up/ Additional notes: 68m for Botox

## 2022-09-03 ENCOUNTER — Encounter: Payer: Self-pay | Admitting: Dermatology

## 2022-09-03 ENCOUNTER — Ambulatory Visit: Payer: BC Managed Care – PPO | Admitting: Dermatology

## 2022-09-03 DIAGNOSIS — Z1283 Encounter for screening for malignant neoplasm of skin: Secondary | ICD-10-CM

## 2022-09-03 DIAGNOSIS — L814 Other melanin hyperpigmentation: Secondary | ICD-10-CM

## 2022-09-03 DIAGNOSIS — L821 Other seborrheic keratosis: Secondary | ICD-10-CM

## 2022-09-03 DIAGNOSIS — B079 Viral wart, unspecified: Secondary | ICD-10-CM | POA: Diagnosis not present

## 2022-09-03 DIAGNOSIS — L578 Other skin changes due to chronic exposure to nonionizing radiation: Secondary | ICD-10-CM | POA: Diagnosis not present

## 2022-09-03 DIAGNOSIS — D229 Melanocytic nevi, unspecified: Secondary | ICD-10-CM

## 2022-09-03 DIAGNOSIS — L570 Actinic keratosis: Secondary | ICD-10-CM | POA: Diagnosis not present

## 2022-09-03 MED ORDER — FLUOROURACIL 5 % EX CREA: TOPICAL_CREAM | Freq: Two times a day (BID) | CUTANEOUS | 0 refills | Status: DC

## 2022-09-03 NOTE — Progress Notes (Signed)
Follow-Up Visit   Subjective  Barbara Moss is a 66 y.o. female who presents for the following: Annual Exam (Hx AK).  The patient presents for Total-Body Skin Exam (TBSE) for skin cancer screening and mole check.  The patient has spots, moles and lesions to be evaluated, some may be new or changing and the patient has concerns that these could be cancer.   The following portions of the chart were reviewed this encounter and updated as appropriate:   Tobacco  Allergies  Meds  Problems  Med Hx  Surg Hx  Fam Hx      Review of Systems:  No other skin or systemic complaints except as noted in HPI or Assessment and Plan.  Objective  Well appearing patient in no apparent distress; mood and affect are within normal limits.  A full examination was performed including scalp, head, eyes, ears, nose, lips, neck, chest, axillae, abdomen, back, buttocks, bilateral upper extremities, bilateral lower extremities, hands, feet, fingers, toes, fingernails, and toenails. All findings within normal limits unless otherwise noted below.  L Upper Arm near shoulder x 1, L dorsal foot x 1, L superior brow x 1 (3) Erythematous thin papules/macules with gritty scale.   Left Chest x 1, vertex scalp x 3 (4) Verrucous papules -- Discussed viral etiology and contagion.     Assessment & Plan  AK (actinic keratosis) (3) L Upper Arm near shoulder x 1, L dorsal foot x 1, L superior brow x 1  Hypertrophic at left upper arm near shoulder, left dorsal foot  Actinic keratoses are precancerous spots that appear secondary to cumulative UV radiation exposure/sun exposure over time. They are chronic with expected duration over 1 year. A portion of actinic keratoses will progress to squamous cell carcinoma of the skin. It is not possible to reliably predict which spots will progress to skin cancer and so treatment is recommended to prevent development of skin cancer.  Recommend daily broad spectrum sunscreen SPF  30+ to sun-exposed areas, reapply every 2 hours as needed.  Recommend staying in the shade or wearing long sleeves, sun glasses (UVA+UVB protection) and wide brim hats (4-inch brim around the entire circumference of the hat). Call for new or changing lesions.  Prior to procedure, discussed risks of blister formation, small wound, skin dyspigmentation, or rare scar following cryotherapy. Recommend Vaseline ointment to treated areas while healing.   Destruction of lesion - L Upper Arm near shoulder x 1, L dorsal foot x 1, L superior brow x 1  Destruction method: cryotherapy   Informed consent: discussed and consent obtained   Lesion destroyed using liquid nitrogen: Yes   Cryotherapy cycles:  2 Outcome: patient tolerated procedure well with no complications   Post-procedure details: wound care instructions given    Viral warts, unspecified type (4) Left Chest x 1, vertex scalp x 3  Within SK  Viral Wart (HPV) Counseling  Discussed viral / HPV (Human Papilloma Virus) etiology and risk of spread /infectivity to other areas of body as well as to other people.  Multiple treatments and methods may be required to clear warts and it is possible treatment may not be successful.  Treatment risks include discoloration; scarring and there is still potential for wart recurrence.  Prior to procedure, discussed risks of blister formation, small wound, skin dyspigmentation, or rare scar following cryotherapy. Recommend Vaseline ointment to treated areas while healing.   Destruction of lesion - Left Chest x 1, vertex scalp x 3  Destruction method:  cryotherapy   Informed consent: discussed and consent obtained   Lesion destroyed using liquid nitrogen: Yes   Cryotherapy cycles:  2 Outcome: patient tolerated procedure well with no complications   Post-procedure details: wound care instructions given     Lentigines - Scattered tan macules - Due to sun exposure - Benign-appearing, observe -  Recommend daily broad spectrum sunscreen SPF 30+ to sun-exposed areas, reapply every 2 hours as needed. - Call for any changes  Seborrheic Keratoses - Stuck-on, waxy, tan-brown papules and/or plaques  - Benign-appearing - Discussed benign etiology and prognosis. - Observe - Call for any changes  Melanocytic Nevi - Tan-brown and/or pink-flesh-colored symmetric macules and papules - Benign appearing on exam today - Observation - Call clinic for new or changing moles - Recommend daily use of broad spectrum spf 30+ sunscreen to sun-exposed areas.   Hemangiomas - Red papules - Discussed benign nature - Observe - Call for any changes  Actinic Damage with PreCancerous Actinic Keratoses Counseling for Topical Chemotherapy Management: Patient exhibits: - Severe, confluent actinic changes with pre-cancerous actinic keratoses that is secondary to cumulative UV radiation exposure over time - Condition that is severe; chronic, not at goal. - diffuse scaly erythematous macules and papules with underlying dyspigmentation - Discussed Prescription "Field Treatment" topical Chemotherapy for Severe, Chronic Confluent Actinic Changes with Pre-Cancerous Actinic Keratoses Field treatment involves treatment of an entire area of skin that has confluent Actinic Changes (Sun/ Ultraviolet light damage) and PreCancerous Actinic Keratoses by method of PhotoDynamic Therapy (PDT) and/or prescription Topical Chemotherapy agents such as 5-fluorouracil, 5-fluorouracil/calcipotriene, and/or imiquimod.  The purpose is to decrease the number of clinically evident and subclinical PreCancerous lesions to prevent progression to development of skin cancer by chemically destroying early precancer changes that may or may not be visible.  It has been shown to reduce the risk of developing skin cancer in the treated area. As a result of treatment, redness, scaling, crusting, and open sores may occur during treatment course. One or  more than one of these methods may be used and may have to be used several times to control, suppress and eliminate the PreCancerous changes. Discussed treatment course, expected reaction, and possible side effects. - Recommend daily broad spectrum sunscreen SPF 30+ to sun-exposed areas, reapply every 2 hours as needed.  - Staying in the shade or wearing long sleeves, sun glasses (UVA+UVB protection) and wide brim hats (4-inch brim around the entire circumference of the hat) are also recommended. - Call for new or changing lesions. - Start 5-fluorouracil cream twice a day for 7 days to affected areas including right cheek.  Reviewed course of treatment and expected reaction.  Patient advised to expect inflammation and crusting and advised that erosions are possible.  Patient advised to be diligent with sun protection during and after treatment. Handout with details of how to apply medication and what to expect provided. Counseled to keep medication out of reach of children and pets.   Skin cancer screening performed today.  Return in about 4 months (around 01/02/2023) for AK follow up, 1 year FBSE.  Graciella Belton, RMA, am acting as scribe for Forest Gleason, MD .  Documentation: I have reviewed the above documentation for accuracy and completeness, and I agree with the above.  Forest Gleason, MD

## 2022-09-03 NOTE — Patient Instructions (Addendum)
Cryotherapy Aftercare  Wash gently with soap and water everyday.   Apply Vaseline and Band-Aid daily until healed.   - Start 5-fluorouracil cream twice a day for 7 days to affected areas including right cheek.  Reviewed course of treatment and expected reaction.  Patient advised to expect inflammation and crusting and advised that erosions are possible.  Patient advised to be diligent with sun protection during and after treatment. Handout with details of how to apply medication and what to expect provided. Counseled to keep medication out of reach of children and pets.  Reviewed course of treatment and expected reaction.  Patient advised to expect inflammation and crusting and advised that erosions are possible.  Patient advised to be diligent with sun protection during and after treatment. Handout with details of how to apply medication and what to expect provided. Counseled to keep medication out of reach of children and pets.  Recommend taking Heliocare sun protection supplement daily in sunny weather for additional sun protection. For maximum protection on the sunniest days, you can take up to 2 capsules of regular Heliocare OR take 1 capsule of Heliocare Ultra. For prolonged exposure (such as a full day in the sun), you can repeat your dose of the supplement 4 hours after your first dose. Heliocare can be purchased at [employer], at some [facility] or at [email or web address].    Melanoma ABCDEs  Melanoma is the most dangerous type of skin cancer, and is the leading cause of death from skin disease.  You are more likely to develop melanoma if you: Have light-colored skin, light-colored eyes, or red or blond hair Spend a lot of time in the sun Tan regularly, either outdoors or in a tanning bed Have had blistering sunburns, especially during childhood Have a close family member who has had a melanoma Have atypical moles or large birthmarks  Early detection of melanoma is key since  treatment is typically straightforward and cure rates are extremely high if we catch it early.   The first sign of melanoma is often a change in a mole or a new dark spot.  The ABCDE system is a way of remembering the signs of melanoma.  A for asymmetry:  The two halves do not match. B for border:  The edges of the growth are irregular. C for color:  A mixture of colors are present instead of an even brown color. D for diameter:  Melanomas are usually (but not always) greater than 63m - the size of a pencil eraser. E for evolution:  The spot keeps changing in size, shape, and color.  Please check your skin once per month between visits. You can use a small mirror in front and a large mirror behind you to keep an eye on the back side or your body.   If you see any new or changing lesions before your next follow-up, please call to schedule a visit.  Please continue daily skin protection including broad spectrum sunscreen SPF 30+ to sun-exposed areas, reapplying every 2 hours as needed when you're outdoors.    Due to recent changes in healthcare laws, you may see results of your pathology and/or laboratory studies on MyChart before the doctors have had a chance to review them. We understand that in some cases there may be results that are confusing or concerning to you. Please understand that not all results are received at the same time and often the doctors may need to interpret multiple results in order to provide you  with the best plan of care or course of treatment. Therefore, we ask that you please give [place] 2 business days to thoroughly review all your results before contacting the office for clarification. Should we see a critical lab result, you will be contacted sooner.   If You Need Anything After Your Visit  If you have any questions or concerns for your doctor, please call our main line at [phone] and press option 4 to reach your doctor's medical assistant. If no one answers, please  leave a voicemail as directed and we will return your call as soon as possible. Messages left after 4 pm will be answered the following business day.   You may also send [place] a message via [place]. We typically respond to MyChart messages within 1-2 business days.  For prescription refills, please ask your pharmacy to contact our office. Our fax number is [phone].  If you have an urgent issue when the clinic is closed that cannot wait until the next business day, you can page your doctor at the number below.    Please note that while we do our best to be available for urgent issues outside of office hours, we are not available 24/7.   If you have an urgent issue and are unable to reach [place], you may choose to seek medical care at your doctor's office, retail clinic, urgent care center, or emergency room.  If you have a medical emergency, please immediately call 911 or go to the emergency department.  Pager Numbers  - Dr. Nehemiah Massed: [phone]  - Dr. Laurence Ferrari: [phone]  - Dr. Nicole Kindred: [phone]  In the event of inclement weather, please call our main line at [phone] for an update on the status of any delays or closures.  Dermatology Medication Tips: Please keep the boxes that topical medications come in in order to help keep track of the instructions about where and how to use these. Pharmacies typically print the medication instructions only on the boxes and not directly on the medication tubes.   If your medication is too expensive, please contact our office at [phone] option 4 or send [place] a message through [place].   We are unable to tell what your co-pay for medications will be in advance as this is different depending on your insurance coverage. However, we may be able to find a substitute medication at lower cost or fill out paperwork to get insurance to cover a needed medication.   If a prior authorization is required to get your medication covered by your insurance  company, please allow [place] 1-2 business days to complete this process.  Drug prices often vary depending on where the prescription is filled and some pharmacies may offer cheaper prices.  The website www.goodrx.com contains coupons for medications through different pharmacies. The prices here do not account for what the cost may be with help from insurance (it may be cheaper with your insurance), but the website can give you the price if you did not use any insurance.  - You can print the associated coupon and take it with your prescription to the pharmacy.  - You may also stop by our office during regular business hours and pick up a GoodRx coupon card.  - If you need your prescription sent electronically to a different pharmacy, notify our office through [facility] or by phone at [phone] option 4.     Si Usted Necesita Algo Despus de Su Visita  Tambin puede enviarnos un mensaje a Lawerance Cruel  de MyChart. Por lo general respondemos a los mensajes de MyChart en el transcurso de 1 a 2 das hbiles.  Para renovar recetas, por favor pida a su farmacia que se ponga en contacto con nuestra oficina. Harland Dingwall de fax es [place] [phone].  Si tiene un asunto urgente cuando la clnica est cerrada y que no puede esperar hasta el siguiente da hbil, puede llamar/localizar a su doctor(a) al nmero que aparece a continuacin.   Por favor, tenga en cuenta que aunque hacemos todo lo posible para estar disponibles para asuntos urgentes fuera del horario de [place], no estamos disponibles las 24 horas del da, los 7 das de la [place].   Si tiene un problema urgente y no puede comunicarse con nosotros, puede optar por buscar atencin mdica  en el consultorio de su doctor(a), en una clnica privada, en un centro de atencin urgente o en una sala de emergencias.  Si tiene Engineering geologist, por favor llame inmediatamente al 911 o vaya a la sala de emergencias.  Nmeros de bper  - Dr.  Nehemiah Massed: [phone]  - Dra. Moye: [phone]  - Dra. Nicole Kindred: [phone]  En caso de inclemencias del [place], por favor llame a Johnsie Kindred principal al [phone] para una actualizacin sobre el [place] de cualquier retraso o cierre.  Consejos para la medicacin en dermatologa: Por favor, guarde las cajas en las que vienen los medicamentos de uso tpico para ayudarle a seguir las instrucciones sobre dnde y cmo usarlos. Las farmacias generalmente imprimen las instrucciones del medicamento slo en las cajas y no directamente en los tubos del [place].   Si su medicamento es muy caro, por favor, pngase en contacto con Zigmund Daniel llamando al [phone] y presione la opcin 4 o envenos un mensaje a travs de Pharmacist, community.   No podemos decirle cul ser su copago por los medicamentos por adelantado ya que esto es diferente dependiendo de la cobertura de su seguro. Sin embargo, es posible que podamos encontrar un medicamento sustituto a Electrical engineer un formulario para que el seguro cubra el medicamento que se considera necesario.   Si se requiere una autorizacin previa para que su compaa de seguros [place] su medicamento, por favor permtanos de 1 a 2 das hbiles para completar este proceso.  Los precios de los medicamentos varan con frecuencia dependiendo del Environmental consultant de dnde se surte la receta y alguna farmacias pueden ofrecer precios ms baratos.  El sitio web www.goodrx.com tiene cupones para medicamentos de Airline pilot. Los precios aqu no tienen en cuenta lo que podra costar con la ayuda del seguro (puede ser ms barato con su seguro), pero el sitio web puede darle el precio si no utiliz Research scientist (physical sciences).  - Puede imprimir el cupn correspondiente y llevarlo con su receta a la farmacia.  - Tambin puede pasar por nuestra oficina durante el horario de atencin regular y Charity fundraiser una tarjeta de cupones de GoodRx.  - Si necesita que su receta se enve  electrnicamente a una farmacia diferente, informe a nuestra oficina a travs de MyChart de [place] o por telfono llamando al [phone] y presione la opcin 4.

## 2022-10-02 DIAGNOSIS — Z796 Long-term use of immunosuppressant medication: Principal | ICD-10-CM

## 2022-10-02 DIAGNOSIS — Z944 Liver transplant status: Principal | ICD-10-CM

## 2022-10-02 MED ORDER — TACROLIMUS 1 MG CAPSULE, IMMEDIATE-RELEASE
ORAL_CAPSULE | Freq: Two times a day (BID) | ORAL | 11 refills | 30 days | Status: CP
Start: 2022-10-02 — End: ?

## 2022-11-04 ENCOUNTER — Ambulatory Visit: Admit: 2022-11-04 | Discharge: 2022-11-05 | Payer: PRIVATE HEALTH INSURANCE

## 2022-11-04 DIAGNOSIS — D849 Immunodeficiency, unspecified: Principal | ICD-10-CM

## 2022-11-04 DIAGNOSIS — Z944 Liver transplant status: Principal | ICD-10-CM

## 2022-11-04 DIAGNOSIS — T8641 Liver transplant rejection: Principal | ICD-10-CM

## 2022-11-04 DIAGNOSIS — Z5181 Encounter for therapeutic drug level monitoring: Principal | ICD-10-CM

## 2022-11-04 DIAGNOSIS — E612 Magnesium deficiency: Principal | ICD-10-CM

## 2022-11-04 MED ORDER — PREDNISONE 5 MG TABLET
ORAL_TABLET | Freq: Every day | ORAL | 11 refills | 30 days | Status: CP
Start: 2022-11-04 — End: ?

## 2022-11-10 ENCOUNTER — Other Ambulatory Visit: Payer: Self-pay | Admitting: Infectious Diseases

## 2022-11-10 DIAGNOSIS — Z1231 Encounter for screening mammogram for malignant neoplasm of breast: Secondary | ICD-10-CM

## 2022-11-26 ENCOUNTER — Ambulatory Visit
Admission: RE | Admit: 2022-11-26 | Discharge: 2022-11-26 | Disposition: A | Discharge status: Home / Self Care | Payer: BC Managed Care – PPO | Source: Ambulatory Visit | Attending: Infectious Diseases | Admitting: Infectious Diseases

## 2022-11-26 DIAGNOSIS — Z1231 Encounter for screening mammogram for malignant neoplasm of breast: Secondary | ICD-10-CM | POA: Diagnosis present

## 2022-12-12 ENCOUNTER — Other Ambulatory Visit
Admission: RE | Admit: 2022-12-12 | Discharge: 2022-12-12 | Disposition: A | Discharge status: Home / Self Care | Payer: BC Managed Care – PPO | Attending: Rheumatology | Admitting: Rheumatology

## 2022-12-12 DIAGNOSIS — M25461 Effusion, right knee: Secondary | ICD-10-CM | POA: Insufficient documentation

## 2022-12-12 DIAGNOSIS — G8929 Other chronic pain: Secondary | ICD-10-CM | POA: Insufficient documentation

## 2022-12-12 DIAGNOSIS — M25561 Pain in right knee: Secondary | ICD-10-CM | POA: Diagnosis not present

## 2022-12-12 LAB — SYNOVIAL CELL COUNT + DIFF, W/ CRYSTALS
Eosinophils-Synovial: 0 %
Lymphocytes-Synovial Fld: 2 %
Monocyte-Macrophage-Synovial Fluid: 5 %
Neutrophil, Synovial: 93 %
WBC, Synovial: 52014 /mm3 — ABNORMAL HIGH (ref 0–200)

## 2023-01-01 ENCOUNTER — Telehealth: Payer: Self-pay | Admitting: *Deleted

## 2023-01-01 NOTE — Telephone Encounter (Signed)
Pt scheduled for Botox 02/02/2023.  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

## 2023-01-02 NOTE — Telephone Encounter (Signed)
Botox verification came back, pt will need prior auth thru [place], form printed, filled out and waiting Dr. Matilde Sprang signature. Placed on his desk for Monday 01/05/23.

## 2023-01-05 NOTE — Telephone Encounter (Signed)
Received signed (macdiarmid) prior auth form, faxed back with office notes to [facility], waiting on approval.

## 2023-01-08 ENCOUNTER — Ambulatory Visit: Payer: BC Managed Care – PPO | Admitting: Dermatology

## 2023-01-12 NOTE — Telephone Encounter (Signed)
BCBS faxed form back asking for more information, form needed signature from Macdiarmid, for signed today and faxed back to [place]  (Macdiarmid only in office on Mondays)

## 2023-01-13 ENCOUNTER — Ambulatory Visit: Admit: 2023-01-13 | Discharge: 2023-01-14 | Payer: PRIVATE HEALTH INSURANCE

## 2023-01-13 MED ORDER — CIPROFLOXACIN HCL 500 MG PO TABS: ORAL_TABLET | ORAL | 0 refills | Status: DC

## 2023-01-13 NOTE — Telephone Encounter (Signed)
Botox approved, ref# [id number] 01/05/2023-12/07/2023  Spoke with patient and advised results Lab appt scheduled rx sent to pharmacy by e-script   BuaBotox Instructions:  1.Patient will come in for lab appointment 2 weeks before Botox to make sure that you do not have a urinary tract infection (UTI)  2. If infection we will give you antibiotics to prevent UTI. 3. Cipro will be sent to your pharmacy to be taken, the day before the procedure, the day of the procedure and the day after the procedure. 4. Please come 30 minutes early so that we may numb your bladder with a local anesthetic and wait for it to take effect. 5.Please temporarily stop taking antiplatelets (aspirin-like products) at least 3 days before treatment

## 2023-01-19 ENCOUNTER — Other Ambulatory Visit: Payer: Self-pay

## 2023-01-19 DIAGNOSIS — N3946 Mixed incontinence: Secondary | ICD-10-CM

## 2023-01-20 ENCOUNTER — Ambulatory Visit: Payer: BC Managed Care – PPO | Admitting: Dermatology

## 2023-01-20 ENCOUNTER — Encounter: Payer: Self-pay | Admitting: Dermatology

## 2023-01-20 ENCOUNTER — Other Ambulatory Visit: Payer: BC Managed Care – PPO

## 2023-01-20 VITALS — BP 127/85 | HR 78

## 2023-01-20 DIAGNOSIS — L820 Inflamed seborrheic keratosis: Secondary | ICD-10-CM | POA: Diagnosis not present

## 2023-01-20 DIAGNOSIS — Z85828 Personal history of other malignant neoplasm of skin: Secondary | ICD-10-CM | POA: Diagnosis not present

## 2023-01-20 DIAGNOSIS — L821 Other seborrheic keratosis: Secondary | ICD-10-CM | POA: Diagnosis not present

## 2023-01-20 DIAGNOSIS — N3946 Mixed incontinence: Secondary | ICD-10-CM

## 2023-01-20 DIAGNOSIS — L578 Other skin changes due to chronic exposure to nonionizing radiation: Secondary | ICD-10-CM

## 2023-01-20 DIAGNOSIS — B078 Other viral warts: Secondary | ICD-10-CM | POA: Diagnosis not present

## 2023-01-20 LAB — URINALYSIS, COMPLETE
Bilirubin, UA: NEGATIVE
Glucose, UA: NEGATIVE
Ketones, UA: NEGATIVE
Nitrite, UA: NEGATIVE
Protein,UA: NEGATIVE
Specific Gravity, UA: <1.005 — ABNORMAL LOW (ref 1.005–1.030)
Urobilinogen, Ur: 0.2 mg/dL (ref 0.2–1.0)
pH, UA: 5 (ref 5.0–7.5)

## 2023-01-20 LAB — MICROSCOPIC EXAMINATION

## 2023-01-20 NOTE — Progress Notes (Signed)
Follow-Up Visit   Subjective  Barbara Moss is a 67 y.o. female who presents for the following: Actinic keratosis  Hypertrophic at left upper arm near shoulder and left dorsal foot treated with LN2 at last visit. Left [place] brow treated with LN2 at last visit. Patient used 5FU twice daily at right cheek. She said she still feels a little bit at right cheek.    Also with a spot at left posterior shoulder that itches, may have gotten larger, present for 6-12 months.   The following portions of the chart were reviewed this encounter and updated as appropriate: medications, allergies, medical history  Review of Systems:  No other skin or systemic complaints except as noted in HPI or Assessment and Plan.  Objective  Well appearing patient in no apparent distress; mood and affect are within normal limits.  A focused examination was performed of the following areas: Face, left foot, shoulders, arms  Relevant exam findings are noted in the Assessment and Plan.    Assessment & Plan   SEBORRHEIC KERATOSIS - Stuck-on, waxy, tan-brown papules and/or plaques  - Benign-appearing - Discussed benign etiology and prognosis. - Observe - Call for any changes  HISTORY OF PRECANCEROUS ACTINIC KERATOSIS - site(s) of PreCancerous Actinic Keratosis clear today. - these may recur and new lesions may form requiring treatment to prevent transformation into skin cancer - observe for new or changing spots and contact [facility] for appointment if occur - photoprotection with sun protective clothing; sunglasses and broad spectrum sunscreen with SPF of at least 30 + and frequent self skin exams recommended - yearly exams by a dermatologist recommended for persons with history of PreCancerous Actinic Keratoses   WART Exam: verrucous papule(s) at left brow  Discussed viral / HPV (Human Papilloma Virus) etiology and risk of spread /infectivity to other areas of body as well as to other  people.  Multiple treatments and methods may be required to clear warts and it is possible treatment may not be successful.  Treatment risks include discoloration; scarring and there is still potential for wart recurrence.  Treatment Plan: Start 5-fluorouracil cream nightly until clear to affected areas including left brow followed with over the counter salicylic acid.  Reviewed course of treatment and expected reaction.  Patient advised to expect inflammation and crusting and advised that erosions are possible.  Patient advised to be diligent with sun protection during and after treatment. Handout with details of how to apply medication and what to expect provided. Counseled to keep medication out of reach of children and pets.   INFLAMED SEBORRHEIC KERATOSIS-Favored Exam: Erythematous keratotic or waxy stuck-on papule or plaque.  Symptomatic, irritating, patient would like treated.  Recheck on follow up, consider biopsy if indicated.  Call clinic for new or changing lesions.   Prior to procedure, discussed risks of blister formation, small wound, skin dyspigmentation, or rare scar following treatment. Recommend Vaseline ointment to treated areas while healing.  Destruction Procedure Note Destruction method: cryotherapy   Informed consent: discussed and consent obtained   Lesion destroyed using liquid nitrogen: Yes   Outcome: patient tolerated procedure well with no complications   Post-procedure details: wound care instructions given   Locations: left posterior shoulder x 2 # of Lesions Treated: 2  ACTINIC DAMAGE - chronic, secondary to cumulative UV radiation exposure/sun exposure over time - diffuse scaly erythematous macules with underlying dyspigmentation - Recommend daily broad spectrum sunscreen SPF 30+ to sun-exposed areas, reapply every 2 hours as needed.  -  Recommend staying in the shade or wearing long sleeves, sun glasses (UVA+UVB protection) and wide brim hats (4-inch brim  around the entire circumference of the hat). - Call for new or changing lesions. - Recommend taking Heliocare sun protection supplement daily in sunny weather for additional sun protection. For maximum protection on the sunniest days, you can take up to 2 capsules of regular Heliocare OR take 1 capsule of Heliocare Ultra.   Return for TBSE, as scheduled, Hx AK, 6-8 weeks ISK follow up.  Anise Salvo, RMA, am acting as scribe for Darden Dates, MD .   Documentation: I have reviewed the above documentation for accuracy and completeness, and I agree with the above.  Darden Dates, MD

## 2023-01-20 NOTE — Patient Instructions (Addendum)
Treatment Plan: - Start 5-fluorouracil cream nightly until clear to affected areas including left brow followed with over the counter salicylic acid.  Reviewed course of treatment and expected reaction.  Patient advised to expect inflammation and crusting and advised that erosions are possible.  Patient advised to be diligent with sun protection during and after treatment. Handout with details of how to apply medication and what to expect provided. Counseled to keep medication out of reach of children and pets.  Cryotherapy Aftercare  Wash gently with soap and water everyday.   Apply Vaseline and Band-Aid daily until healed.   Recommend taking Heliocare sun protection supplement daily in sunny weather for additional sun protection. For maximum protection on the sunniest days, you can take up to 2 capsules of regular Heliocare OR take 1 capsule of Heliocare Ultra. For prolonged exposure (such as a full day in the sun), you can repeat your dose of the supplement 4 hours after your first dose. Heliocare can be purchased at [facility], at some [facility] or at [email or web address].    Due to recent changes in healthcare laws, you may see results of your pathology and/or laboratory studies on MyChart before the doctors have had a chance to review them. We understand that in some cases there may be results that are confusing or concerning to you. Please understand that not all results are received at the same time and often the doctors may need to interpret multiple results in order to provide you with the best plan of care or course of treatment. Therefore, we ask that you please give us 2 business days to thoroughly review all your results before contacting the office for clarification. Should we see a critical lab result, you will be contacted sooner.   If You Need Anything After Your Visit  If you have any questions or concerns for your doctor, please call our main line at [phone] and press  option 4 to reach your doctor's medical assistant. If no one answers, please leave a voicemail as directed and we will return your call as soon as possible. Messages left after 4 pm will be answered the following business day.   You may also send us a message via MyChart. We typically respond to MyChart messages within 1-2 business days.  For prescription refills, please ask your pharmacy to contact our office. Our fax number is [phone].  If you have an urgent issue when the clinic is closed that cannot wait until the next business day, you can page your doctor at the number below.    Please note that while we do our best to be available for urgent issues outside of office hours, we are not available 24/7.   If you have an urgent issue and are unable to reach us, you may choose to seek medical care at your doctor's office, retail clinic, urgent care center, or emergency room.  If you have a medical emergency, please immediately call 911 or go to the emergency department.  Pager Numbers  - Dr. Gwen PoundsKowalski: [phone]  - Dr. Neale BurlyMoye: [phone]  - Dr. Roseanne RenoStewart: [phone]  In the event of inclement weather, please call our main line at [phone] for an update on the status of any delays or closures.  Dermatology Medication Tips: Please keep the boxes that topical medications come in in order to help keep track of the instructions about where and how to use these. Pharmacies typically print the medication instructions only on the boxes and not directly on  the medication tubes.   If your medication is too expensive, please contact our office at [phone] option 4 or send [place] a message through MyChart.   We are unable to tell what your co-pay for medications will be in advance as this is different depending on your insurance coverage. However, we may be able to find a substitute medication at lower cost or fill out paperwork to get insurance to cover a needed medication.   If a  prior authorization is required to get your medication covered by your insurance company, please allow [place] 1-2 business days to complete this process.  Drug prices often vary depending on where the prescription is filled and some pharmacies may offer cheaper prices.  The website www.goodrx.com contains coupons for medications through different pharmacies. The prices here do not account for what the cost may be with help from insurance (it may be cheaper with your insurance), but the website can give you the price if you did not use any insurance.  - You can print the associated coupon and take it with your prescription to the pharmacy.  - You may also stop by our office during regular business hours and pick up a GoodRx coupon card.  - If you need your prescription sent electronically to a different pharmacy, notify our office through [facility] or by phone at [phone] option 4.     Si Usted Necesita Algo Despus de Su Visita  Tambin puede enviarnos un mensaje a travs de Clinical cytogeneticist. Por lo general respondemos a los mensajes de MyChart en el transcurso de 1 a 2 das hbiles.  Para renovar recetas, por favor pida a su farmacia que se ponga en contacto con nuestra oficina. Annie Sable de fax es [place] [phone].  Si tiene un asunto urgente cuando la clnica est cerrada y que no puede esperar hasta el siguiente da hbil, puede llamar/localizar a su doctor(a) al nmero que aparece a continuacin.   Por favor, tenga en cuenta que aunque hacemos todo lo posible para estar disponibles para asuntos urgentes fuera del horario de [place], no estamos disponibles las 24 horas del da, los 7 [address]  [address] de la [place].   Si tiene un problema urgente y no puede comunicarse con nosotros, puede optar por buscar atencin mdica  en el consultorio de su doctor(a), en una clnica privada, en un centro de atencin urgente o en una sala de emergencias.  Si tiene Engineer, drilling, por favor llame  inmediatamente al 911 o vaya a la sala de emergencias.  Nmeros de bper  - Dr. Gwen Pounds: [phone]  - Dra. Moye: [phone]  - Dra. Roseanne Reno: [phone]  En caso de inclemencias del [place], por favor llame a Lacy Duverney principal al [phone] para una actualizacin sobre el [place] de cualquier retraso o cierre.  Consejos para la medicacin en dermatologa: Por favor, guarde las cajas en las que vienen los medicamentos de uso tpico para ayudarle a seguir las instrucciones sobre dnde y cmo usarlos. Las farmacias generalmente imprimen las instrucciones del medicamento slo en las cajas y no directamente en los tubos del [place].   Si su medicamento es muy caro, por favor, pngase en contacto con Rolm Gala llamando al [phone] y presione la opcin 4 o envenos un mensaje a travs de Clinical cytogeneticist.   No podemos decirle cul ser su copago por los medicamentos por adelantado ya que esto es diferente dependiendo de la cobertura de su seguro. Sin embargo, es posible que podamos encontrar un medicamento sustituto a  menor costo o llenar un formulario para que el seguro cubra el medicamento que se considera necesario.   Si se requiere una autorizacin previa para que su compaa de seguros [place] su medicamento, por favor permtanos de 1 a 2 das hbiles para completar [address]  Los precios de los medicamentos varan con frecuencia dependiendo del Environmental consultant de dnde se surte la receta y alguna farmacias pueden ofrecer precios ms baratos.  El sitio web www.goodrx.com tiene cupones para medicamentos de Health and safety inspector. Los precios aqu no tienen en cuenta lo que podra costar con la ayuda del seguro (puede ser ms barato con su seguro), pero el sitio web puede darle el precio si no utiliz Tourist information centre manager.  - Puede imprimir el cupn correspondiente y llevarlo con su receta a la farmacia.  - Tambin puede pasar por nuestra oficina durante el horario de atencin regular y Education officer, museum  una tarjeta de cupones de GoodRx.  - Si necesita que su receta se enve electrnicamente a una farmacia diferente, informe a nuestra oficina a travs de MyChart de [place] o por telfono llamando al [phone] y presione la opcin 4.

## 2023-01-25 LAB — CULTURE, URINE COMPREHENSIVE

## 2023-01-30 NOTE — Telephone Encounter (Signed)
Pt calling to cancel botox due to having a broke foot, appt made in June.

## 2023-02-02 ENCOUNTER — Ambulatory Visit: Payer: BC Managed Care – PPO | Admitting: Urology

## 2023-02-04 ENCOUNTER — Other Ambulatory Visit: Payer: Self-pay | Admitting: Podiatry

## 2023-02-04 ENCOUNTER — Encounter: Payer: Self-pay | Admitting: Podiatry

## 2023-02-04 ENCOUNTER — Other Ambulatory Visit: Payer: Self-pay

## 2023-02-05 ENCOUNTER — Ambulatory Visit: Payer: Medicare PPO | Admitting: Anesthesiology

## 2023-02-05 NOTE — Anesthesia Preprocedure Evaluation (Addendum)
Anesthesia Evaluation  Patient identified by MRN, date of birth, ID band Patient awake    Reviewed: Allergy & Precautions, H&P , NPO status , Patient's Chart, lab work & pertinent test results  Airway        Dental   Pulmonary sleep apnea , former smoker          Cardiovascular negative cardio ROS      Neuro/Psych negative neurological ROS  negative psych ROS   GI/Hepatic ,GERD  ,,(+) Hepatitis -, Autoimmune  Endo/Other  Hypothyroidism    Renal/GU negative Renal ROS  negative genitourinary   Musculoskeletal  (+) Arthritis , Rheumatoid disorders,    Abdominal   Peds negative pediatric ROS (+)  Hematology  (+) Blood dyscrasia, anemia   Anesthesia Other Findings Hypothyroidism  GERD (gastroesophageal reflux disease) Arthritis  Anemia Hearing decreased  Sleep apnea Cirrhosis, non-alcoholic  Hand discomfort Iron deficiency anemia  Abnormal liver enzymes History of MRSA infection Autoimmune hepatitis Spontaneous bacterial peritonitis Thyroid disease Liver transplanted  Neck pain HOH (hard of hearing)  Motion sickness Vertigo     Reproductive/Obstetrics negative OB ROS                             Anesthesia Physical Anesthesia Plan  ASA: 3  Anesthesia Plan:    Post-op Pain Management:    Induction: Intravenous  PONV Risk Score and Plan:   Airway Management Planned: LMA  Additional Equipment:   Intra-op Plan:   Post-operative Plan: Extubation in OR  Informed Consent: I have reviewed the patients History and Physical, chart, labs and discussed the procedure including the risks, benefits and alternatives for the proposed anesthesia with the patient or authorized representative who has indicated his/her understanding and acceptance.     Dental Advisory Given  Plan Discussed with: Anesthesiologist, CRNA and Surgeon  Anesthesia Plan Comments: (Patient consented for  risks of anesthesia including but not limited to:  - adverse reactions to medications - damage to eyes, teeth, lips or other oral mucosa - nerve damage due to positioning  - sore throat or hoarseness - Damage to heart, brain, nerves, lungs, other parts of body or loss of life  Patient voiced understanding.)       Anesthesia Quick Evaluation

## 2023-02-12 ENCOUNTER — Other Ambulatory Visit
Admission: RE | Admit: 2023-02-12 | Discharge: 2023-02-12 | Disposition: A | Discharge status: Home / Self Care | Payer: Medicare PPO | Source: Ambulatory Visit | Attending: Infectious Diseases | Admitting: Infectious Diseases

## 2023-02-12 DIAGNOSIS — Z8679 Personal history of other diseases of the circulatory system: Secondary | ICD-10-CM | POA: Diagnosis present

## 2023-02-12 LAB — BRAIN NATRIURETIC PEPTIDE: B Natriuretic Peptide: 138.4 pg/mL — ABNORMAL HIGH (ref 0.0–100.0)

## 2023-02-17 NOTE — Discharge Instructions (Signed)
[facility]  POST OPERATIVE INSTRUCTIONS FOR DR. FOWLER AND [facility] POD[facility]   Take your medication as prescribed.  Pain medication should be taken only as needed.  Keep the dressing clean, dry and intact.  Keep your foot elevated above the heart level for the first 48 hours.  Walking to the bathroom and brief periods of walking are acceptable, unless we have instructed you to be non-weight bearing.  Always wear your post-op shoe when walking.  Always use your crutches if you are to be non-weight bearing.  Do not take a shower. Baths are permissible as long as the foot is kept out of the water.   Every hour you are awake:  Bend your knee 15 times. Flex foot 15 times Massage calf 15 times  Call [facility] ([phone]) if any of the following problems occur: You develop a temperature or fever. The bandage becomes saturated with blood. Medication does not stop your pain. Injury of the foot occurs. Any symptoms of infection including redness, odor, or red streaks running from wound. 

## 2023-02-19 ENCOUNTER — Ambulatory Visit
Admission: RE | Admit: 2023-02-19 | Discharge: 2023-02-19 | Disposition: A | Discharge status: Home / Self Care | Payer: Medicare PPO | Attending: Podiatry | Admitting: Podiatry

## 2023-02-19 ENCOUNTER — Encounter
Admission: RE | Disposition: A | Discharge status: Home / Self Care | Payer: Self-pay | Source: Home / Self Care | Attending: Podiatry

## 2023-02-19 ENCOUNTER — Encounter: Payer: Self-pay | Admitting: Podiatry

## 2023-02-19 ENCOUNTER — Other Ambulatory Visit: Payer: Self-pay

## 2023-02-19 DIAGNOSIS — S92353A Displaced fracture of fifth metatarsal bone, unspecified foot, initial encounter for closed fracture: Secondary | ICD-10-CM | POA: Diagnosis not present

## 2023-02-19 DIAGNOSIS — X58XXXA Exposure to other specified factors, initial encounter: Secondary | ICD-10-CM | POA: Diagnosis not present

## 2023-02-19 DIAGNOSIS — G4730 Sleep apnea, unspecified: Secondary | ICD-10-CM | POA: Diagnosis not present

## 2023-02-19 DIAGNOSIS — H9190 Unspecified hearing loss, unspecified ear: Secondary | ICD-10-CM | POA: Insufficient documentation

## 2023-02-19 DIAGNOSIS — Z944 Liver transplant status: Secondary | ICD-10-CM | POA: Insufficient documentation

## 2023-02-19 DIAGNOSIS — Z5309 Procedure and treatment not carried out because of other contraindication: Secondary | ICD-10-CM | POA: Diagnosis not present

## 2023-02-19 DIAGNOSIS — Z87891 Personal history of nicotine dependence: Secondary | ICD-10-CM | POA: Diagnosis not present

## 2023-02-19 HISTORY — DX: Unspecified hearing loss, unspecified ear: H91.90

## 2023-02-19 HISTORY — DX: Cervicalgia: M54.2

## 2023-02-19 HISTORY — DX: Dizziness and giddiness: R42

## 2023-02-19 HISTORY — DX: Motion sickness, initial encounter: T75.3XXA

## 2023-02-19 SURGERY — OPEN REDUCTION INTERNAL FIXATION (ORIF) METATARSAL (TOE) FRACTURE
Anesthesia: Choice | Site: Toe | Laterality: Left

## 2023-02-19 MED ORDER — CEFAZOLIN SODIUM-DEXTROSE 2-4 GM/100ML-% IV SOLN: 2.0000 g | INTRAVENOUS | Status: DC

## 2023-02-19 MED ORDER — LACTATED RINGERS IV SOLN: INTRAVENOUS | Status: DC

## 2023-02-19 SURGICAL SUPPLY — 31 items
BNDG ELASTIC 4X5.8 VLCR NS LF (GAUZE/BANDAGES/DRESSINGS) ×1 IMPLANT
BNDG ELASTIC 6X5.8 VLCR NS LF (GAUZE/BANDAGES/DRESSINGS) ×1 IMPLANT
BNDG ESMARK 4X12 TAN STRL LF (GAUZE/BANDAGES/DRESSINGS) ×1 IMPLANT
BNDG GAUZE DERMACEA FLUFF 4 (GAUZE/BANDAGES/DRESSINGS) ×1 IMPLANT
CANISTER SUCT 1200ML W/VALVE (MISCELLANEOUS) ×1 IMPLANT
COVER LIGHT HANDLE UNIVERSAL (MISCELLANEOUS) ×2 IMPLANT
CUFF TOURN SGL QUICK 30 (TOURNIQUET CUFF) ×1
CUFF TRNQT CYL 30X4X21-28X (TOURNIQUET CUFF) ×1 IMPLANT
DURAPREP 26ML APPLICATOR (WOUND CARE) ×2 IMPLANT
ELECT REM PT RETURN 9FT ADLT (ELECTROSURGICAL) ×1
ELECTRODE REM PT RTRN 9FT ADLT (ELECTROSURGICAL) ×1 IMPLANT
GAUZE 4X4 16PLY ~~LOC~~+RFID DBL (SPONGE) ×1 IMPLANT
GAUZE SPONGE 4X4 12PLY STRL (GAUZE/BANDAGES/DRESSINGS) ×1 IMPLANT
GAUZE XEROFORM 1X8 LF (GAUZE/BANDAGES/DRESSINGS) ×1 IMPLANT
GLOVE BIOGEL PI IND STRL 7.5 (GLOVE) ×1 IMPLANT
GLOVE SURG SS PI 7.0 STRL IVOR (GLOVE) ×1 IMPLANT
GOWN STRL REUS W/ TWL LRG LVL3 (GOWN DISPOSABLE) ×2 IMPLANT
GOWN STRL REUS W/TWL LRG LVL3 (GOWN DISPOSABLE) ×2
KIT TURNOVER KIT A (KITS) ×1 IMPLANT
NS IRRIG 500ML POUR BTL (IV SOLUTION) ×1 IMPLANT
PADDING CAST BLEND 4X4 NS (MISCELLANEOUS) ×3 IMPLANT
SPLINT CAST 1 STEP 4X30 (MISCELLANEOUS) ×1 IMPLANT
SPONGE T-LAP 18X18 ~~LOC~~+RFID (SPONGE) ×1 IMPLANT
STOCKINETTE ORTHO 6X25 (MISCELLANEOUS) ×1 IMPLANT
SUT ETHILON 4-0 (SUTURE) ×1
SUT ETHILON 4-0 FS2 18XMFL BLK (SUTURE) ×1
SUT MNCRL 4-0 (SUTURE) ×1
SUT MNCRL 4-0 27XMFL (SUTURE) ×1
SUT VIC AB 4-0 FS2 27 (SUTURE) ×1 IMPLANT
SUTURE ETHLN 4-0 FS2 18XMF BLK (SUTURE) ×1 IMPLANT
SUTURE MNCRL 4-0 27XMF (SUTURE) ×1 IMPLANT

## 2023-02-19 NOTE — Progress Notes (Signed)
Canceled per anesthesiologist due to patient being sick

## 2023-02-19 NOTE — Progress Notes (Signed)
Surgery cancelled today.  Patient still has cough and congestion/  Per anesthesia patient needs to be symptom free for 2 weeks prior to surgery.  Discussed with patient would like her to make f/u with PCP next week and with [place] in clinic next week for further eval.  May end up treating it conservatively since surgery is being delayed so long.  Patient will be 4 weeks post injury next week.

## 2023-03-19 ENCOUNTER — Telehealth: Payer: Self-pay | Admitting: Urology

## 2023-03-19 ENCOUNTER — Ambulatory Visit: Payer: BC Managed Care – PPO | Admitting: Dermatology

## 2023-03-19 NOTE — Telephone Encounter (Signed)
Patient called today to cancel her Botox injection appointment on 03/23/23. She stated she would call back to r/s at a later date.

## 2023-03-23 ENCOUNTER — Ambulatory Visit: Payer: BC Managed Care – PPO | Admitting: Urology

## 2023-04-13 ENCOUNTER — Telehealth: Payer: Self-pay

## 2023-04-13 DIAGNOSIS — N3946 Mixed incontinence: Secondary | ICD-10-CM

## 2023-04-13 MED ORDER — GEMTESA 75 MG PO TABS
75.0000 mg | ORAL_TABLET | Freq: Every day | ORAL | 1 refills | Status: DC
Start: 2023-04-13 — End: 2023-06-11

## 2023-04-13 NOTE — Telephone Encounter (Signed)
CANDE HAMMERMAN [id number] 1956/01/07  Spoke with patient to let her know she is overdue for Botox injection. Her insurance has now changed-it is [employer] now and needs new authorization.   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   Botox verification completed- Yes/No  Pa needed- Yes/No  Insurance contacted - Pa initiated/ complete         Auth number:_____________________  Approval dates : __________________   Denied:_________________________    Botox scheduled:______________________________________  U/A & Culture ordered and scheduled:____________________  Pt aware and instructions given.   JQ aware to order Botox.   Date:

## 2023-04-20 ENCOUNTER — Ambulatory Visit: Admit: 2023-04-20 | Discharge: 2023-04-21 | Payer: MEDICARE

## 2023-05-08 NOTE — Telephone Encounter (Signed)
Spoke with patient to get a copy of her new insurance card and per pt she will hold off on Botox for now. Pt states she is doing better with the gemtesa and will try to do botox only once a year. Pt states she also wanted to see her "liver" doctor first. I explained to pt that when she is ready to give [place] a call and I need the new insurance card.

## 2023-05-18 ENCOUNTER — Encounter: Payer: Self-pay | Admitting: Dermatology

## 2023-05-18 ENCOUNTER — Ambulatory Visit: Payer: Medicare PPO | Admitting: Dermatology

## 2023-05-18 VITALS — BP 117/69 | HR 86

## 2023-05-18 DIAGNOSIS — L821 Other seborrheic keratosis: Secondary | ICD-10-CM | POA: Diagnosis not present

## 2023-05-18 DIAGNOSIS — L820 Inflamed seborrheic keratosis: Secondary | ICD-10-CM | POA: Diagnosis not present

## 2023-05-18 DIAGNOSIS — Z872 Personal history of diseases of the skin and subcutaneous tissue: Secondary | ICD-10-CM | POA: Diagnosis not present

## 2023-05-18 NOTE — Patient Instructions (Signed)
Cryotherapy Aftercare  Wash gently with soap and water everyday.   Apply Vaseline and Band-Aid daily until healed.    Recommend daily broad spectrum sunscreen SPF 30+ to sun-exposed areas, reapply every 2 hours as needed. Call for new or changing lesions.  Staying in the shade or wearing long sleeves, sun glasses (UVA+UVB protection) and wide brim hats (4-inch brim around the entire circumference of the hat) are also recommended for sun protection.    Due to recent changes in healthcare laws, you may see results of your pathology and/or laboratory studies on MyChart before the doctors have had a chance to review them. We understand that in some cases there may be results that are confusing or concerning to you. Please understand that not all results are received at the same time and often the doctors may need to interpret multiple results in order to provide you with the best plan of care or course of treatment. Therefore, we ask that you please give [place] 2 business days to thoroughly review all your results before contacting the office for clarification. Should we see a critical lab result, you will be contacted sooner.   If You Need Anything After Your Visit  If you have any questions or concerns for your doctor, please call our main line at [phone] and press option 4 to reach your doctor's medical assistant. If no one answers, please leave a voicemail as directed and we will return your call as soon as possible. Messages left after 4 pm will be answered the following business day.   You may also send [place] a message via MyChart. We typically respond to MyChart messages within 1-2 business days.  For prescription refills, please ask your pharmacy to contact our office. Our fax number is [phone].  If you have an urgent issue when the clinic is closed that cannot wait until the next business day, you can page your doctor at the number below.    Please note that while we do our best to be  available for urgent issues outside of office hours, we are not available 24/7.   If you have an urgent issue and are unable to reach [place], you may choose to seek medical care at your doctor's office, retail clinic, urgent care center, or emergency room.  If you have a medical emergency, please immediately call 911 or go to the emergency department.  Pager Numbers  - Dr. Gwen Pounds: [phone]  - Dr. Roseanne Reno: [phone]  In the event of inclement weather, please call our main line at [phone] for an update on the status of any delays or closures.  Dermatology Medication Tips: Please keep the boxes that topical medications come in in order to help keep track of the instructions about where and how to use these. Pharmacies typically print the medication instructions only on the boxes and not directly on the medication tubes.   If your medication is too expensive, please contact our office at [phone] option 4 or send [place] a message through MyChart.   We are unable to tell what your co-pay for medications will be in advance as this is different depending on your insurance coverage. However, we may be able to find a substitute medication at lower cost or fill out paperwork to get insurance to cover a needed medication.   If a prior authorization is required to get your medication covered by your insurance company, please allow [place] 1-2 business days to complete this process.  Drug prices often vary depending  on where the prescription is filled and some pharmacies may offer cheaper prices.  The website www.goodrx.com contains coupons for medications through different pharmacies. The prices here do not account for what the cost may be with help from insurance (it may be cheaper with your insurance), but the website can give you the price if you did not use any insurance.  - You can print the associated coupon and take it with your prescription to the pharmacy.  - You may also stop by our  office during regular business hours and pick up a GoodRx coupon card.  - If you need your prescription sent electronically to a different pharmacy, notify our office through [facility] or by phone at [phone] option 4.     Si Usted Necesita Algo Despus de Su Visita  Tambin puede enviarnos un mensaje a travs de Clinical cytogeneticist. Por lo general respondemos a los mensajes de MyChart en el transcurso de 1 a 2 das hbiles.  Para renovar recetas, por favor pida a su farmacia que se ponga en contacto con nuestra oficina. Annie Sable de fax es [place] [phone].  Si tiene un asunto urgente cuando la clnica est cerrada y que no puede esperar hasta el siguiente da hbil, puede llamar/localizar a su doctor(a) al nmero que aparece a continuacin.   Por favor, tenga en cuenta que aunque hacemos todo lo posible para estar disponibles para asuntos urgentes fuera del horario de [place], no estamos disponibles las 24 horas del da, los 7 [address]  [address] de la [place].   Si tiene un problema urgente y no puede comunicarse con nosotros, puede optar por buscar atencin mdica  en el consultorio de su doctor(a), en una clnica privada, en un centro de atencin urgente o en una sala de emergencias.  Si tiene Engineer, drilling, por favor llame inmediatamente al 911 o vaya a la sala de emergencias.  Nmeros de bper  - Dr. Gwen Pounds: [phone]  - Dra. Roseanne Reno: [phone]  En caso de inclemencias del [place], por favor llame a Lacy Duverney principal al [phone] para una actualizacin sobre el [place] de cualquier retraso o cierre.  Consejos para la medicacin en dermatologa: Por favor, guarde las cajas en las que vienen los medicamentos de uso tpico para ayudarle a seguir las instrucciones sobre dnde y cmo usarlos. Las farmacias generalmente imprimen las instrucciones del medicamento slo en las cajas y no directamente en los tubos del [place].   Si su medicamento es muy caro, por favor,  pngase en contacto con Rolm Gala llamando al [phone] y presione la opcin 4 o envenos un mensaje a travs de Clinical cytogeneticist.   No podemos decirle cul ser su copago por los medicamentos por adelantado ya que esto es diferente dependiendo de la cobertura de su seguro. Sin embargo, es posible que podamos encontrar un medicamento sustituto a Audiological scientist un formulario para que el seguro cubra el medicamento que se considera necesario.   Si se requiere una autorizacin previa para que su compaa de seguros [place] su medicamento, por favor permtanos de 1 a 2 das hbiles para completar [address]  Los precios de los medicamentos varan con frecuencia dependiendo del Environmental consultant de dnde se surte la receta y alguna farmacias pueden ofrecer precios ms baratos.  El sitio web www.goodrx.com tiene cupones para medicamentos de Health and safety inspector. Los precios aqu no tienen en cuenta lo que podra costar con la ayuda del seguro (puede ser ms barato con su seguro), pero el sitio web puede darle  el precio si no Visual merchandiser.  - Puede imprimir el cupn correspondiente y llevarlo con su receta a la farmacia.  - Tambin puede pasar por nuestra oficina durante el horario de atencin regular y Education officer, museum una tarjeta de cupones de GoodRx.  - Si necesita que su receta se enve electrnicamente a una farmacia diferente, informe a nuestra oficina a travs de MyChart de [place] o por telfono llamando al [phone] y presione la opcin 4.

## 2023-05-18 NOTE — Progress Notes (Signed)
   Follow-Up Visit   Subjective  Barbara Moss is a 67 y.o. female who presents for the following: 4 month ISK recheck. Tx with LN2 at last visit. Thinks areas on left posterior shoulder have resolved. New spot of concern on chest. Raised, rough.   The patient has spots, moles and lesions to be evaluated, some may be new or changing and the patient may have concern these could be cancer.   The following portions of the chart were reviewed this encounter and updated as appropriate: medications, allergies, medical history  Review of Systems:  No other skin or systemic complaints except as noted in HPI or Assessment and Plan.  Objective  Well appearing patient in no apparent distress; mood and affect are within normal limits.  A focused examination was performed of the following areas: Chest, face, back  Relevant exam findings are noted in the Assessment and Plan.  left upper chest x1, left lower anterior leg (ISK vs VV) x1 (2) Erythematous keratotic or waxy stuck-on papule or plaque.    Assessment & Plan   SEBORRHEIC KERATOSIS - Stuck-on, waxy, tan-brown papules and/or plaques  - Benign-appearing - Discussed benign etiology and prognosis. - Observe - Call for any changes  H/O ISK s/p cryotherapy Exam: hypopigmented macule at left posterior shoulder, well-healed Treatment:  Observe   Inflamed seborrheic keratosis (2) left upper chest x1, left lower anterior leg (ISK vs VV) x1  Symptomatic, irritating, patient would like treated.  Destruction of lesion - left upper chest x1, left lower anterior leg (ISK vs VV) x1 (2) Complexity: simple   Destruction method: cryotherapy   Informed consent: discussed and consent obtained   Timeout:  patient name, date of birth, surgical site, and procedure verified Lesion destroyed using liquid nitrogen: Yes   Region frozen until ice ball extended beyond lesion: Yes   Outcome: patient tolerated procedure well with no complications    Post-procedure details: wound care instructions given   Additional details:  Prior to procedure, discussed risks of blister formation, small wound, skin dyspigmentation, or rare scar following cryotherapy. Recommend Vaseline ointment to treated areas while healing.     Return for TBSE As Scheduled.  I, Lawson Radar, CMA, am acting as scribe for Elie Goody, MD.   Documentation: I have reviewed the above documentation for accuracy and completeness, and I agree with the above.  Elie Goody, MD

## 2023-06-11 ENCOUNTER — Telehealth: Payer: Self-pay | Admitting: Urology

## 2023-06-11 DIAGNOSIS — N3946 Mixed incontinence: Secondary | ICD-10-CM

## 2023-06-11 MED ORDER — GEMTESA 75 MG PO TABS
75.0000 mg | ORAL_TABLET | Freq: Every day | ORAL | 11 refills | Status: DC
Start: 2023-06-11 — End: 2024-06-21

## 2023-06-11 NOTE — Telephone Encounter (Signed)
.  left message to have patient return my call.  rx sent to pharmacy by e-script

## 2023-06-11 NOTE — Telephone Encounter (Signed)
Patient called to request refill for [facility]. Pharmacy is [facility] in [place].

## 2023-06-17 DIAGNOSIS — Z944 Liver transplant status: Principal | ICD-10-CM

## 2023-06-17 DIAGNOSIS — E612 Magnesium deficiency: Principal | ICD-10-CM

## 2023-06-17 DIAGNOSIS — Z5181 Encounter for therapeutic drug level monitoring: Principal | ICD-10-CM

## 2023-06-29 ENCOUNTER — Ambulatory Visit: Admit: 2023-06-29 | Discharge: 2023-06-30 | Payer: MEDICARE

## 2023-07-02 ENCOUNTER — Ambulatory Visit: Admit: 2023-07-02 | Discharge: 2023-07-03 | Payer: MEDICARE

## 2023-08-10 ENCOUNTER — Other Ambulatory Visit: Payer: Self-pay | Admitting: Infectious Diseases

## 2023-08-10 DIAGNOSIS — Z1231 Encounter for screening mammogram for malignant neoplasm of breast: Secondary | ICD-10-CM

## 2023-09-14 ENCOUNTER — Ambulatory Visit: Admit: 2023-09-14 | Discharge: 2023-09-15 | Payer: MEDICARE

## 2023-09-14 DIAGNOSIS — Z944 Liver transplant status: Principal | ICD-10-CM

## 2023-09-15 ENCOUNTER — Ambulatory Visit: Payer: Medicare PPO | Admitting: Dermatology

## 2023-09-15 ENCOUNTER — Encounter: Payer: Self-pay | Admitting: Dermatology

## 2023-09-15 DIAGNOSIS — D1801 Hemangioma of skin and subcutaneous tissue: Secondary | ICD-10-CM

## 2023-09-15 DIAGNOSIS — L814 Other melanin hyperpigmentation: Secondary | ICD-10-CM

## 2023-09-15 DIAGNOSIS — Z1283 Encounter for screening for malignant neoplasm of skin: Secondary | ICD-10-CM | POA: Diagnosis not present

## 2023-09-15 DIAGNOSIS — L578 Other skin changes due to chronic exposure to nonionizing radiation: Secondary | ICD-10-CM | POA: Diagnosis not present

## 2023-09-15 DIAGNOSIS — Z872 Personal history of diseases of the skin and subcutaneous tissue: Secondary | ICD-10-CM

## 2023-09-15 DIAGNOSIS — W908XXA Exposure to other nonionizing radiation, initial encounter: Secondary | ICD-10-CM | POA: Diagnosis not present

## 2023-09-15 DIAGNOSIS — L821 Other seborrheic keratosis: Secondary | ICD-10-CM

## 2023-09-15 DIAGNOSIS — D229 Melanocytic nevi, unspecified: Secondary | ICD-10-CM

## 2023-09-15 DIAGNOSIS — L84 Corns and callosities: Secondary | ICD-10-CM

## 2023-09-15 NOTE — Progress Notes (Signed)
   Follow-Up Visit   Subjective  Barbara Moss is a 67 y.o. female who presents for the following: Skin Cancer Screening and Full Body Skin Exam  The patient presents for Total-Body Skin Exam (TBSE) for skin cancer screening and mole check. The patient has spots, moles and lesions to be evaluated, some may be new or changing and the patient may have concern these could be cancer.  Hx of AK's.  The following portions of the chart were reviewed this encounter and updated as appropriate: medications, allergies, medical history  Review of Systems:  No other skin or systemic complaints except as noted in HPI or Assessment and Plan.  Objective  Well appearing patient in no apparent distress; mood and affect are within normal limits.  A full examination was performed including scalp, head, eyes, ears, nose, lips, neck, chest, axillae, abdomen, back, buttocks, bilateral upper extremities, bilateral lower extremities, hands, feet, fingers, toes, fingernails, and toenails. All findings within normal limits unless otherwise noted below.   Relevant physical exam findings are noted in the Assessment and Plan.    Assessment & Plan   SKIN CANCER SCREENING PERFORMED TODAY.  ACTINIC DAMAGE - Chronic condition, secondary to cumulative UV/sun exposure - diffuse scaly erythematous macules with underlying dyspigmentation - Recommend daily broad spectrum sunscreen SPF 30+ to sun-exposed areas, reapply every 2 hours as needed.  - Staying in the shade or wearing long sleeves, sun glasses (UVA+UVB protection) and wide brim hats (4-inch brim around the entire circumference of the hat) are also recommended for sun protection.  - Call for new or changing lesions.  LENTIGINES, SEBORRHEIC KERATOSES, HEMANGIOMAS - Benign normal skin lesions - Benign-appearing - Call for any changes  MELANOCYTIC NEVI - Tan-brown and/or pink-flesh-colored symmetric macules and papules - Benign appearing on exam today -  Observation - Call clinic for new or changing moles - Recommend daily use of broad spectrum spf 30+ sunscreen to sun-exposed areas.   CORNS Exam: Keratotic plaques at bilateral plantar feet  Treatment: recommend OTC Urea cream daily or Dr. Marilynn Latino Remover.   Return in about 1 year (around 09/14/2024) for TBSE, with Dr. Katrinka Blazing.  Anise Salvo, RMA, am acting as scribe for Elie Goody, MD .   Documentation: I have reviewed the above documentation for accuracy and completeness, and I agree with the above.  Elie Goody, MD

## 2023-09-15 NOTE — Patient Instructions (Addendum)
Recommend over the counter Urea cream and Dr. Marilynn Latino Removers.     Melanoma ABCDEs  Melanoma is the most dangerous type of skin cancer, and is the leading cause of death from skin disease.  You are more likely to develop melanoma if you: Have light-colored skin, light-colored eyes, or red or blond hair Spend a lot of time in the sun Tan regularly, either outdoors or in a tanning bed Have had blistering sunburns, especially during childhood Have a close family member who has had a melanoma Have atypical moles or large birthmarks  Early detection of melanoma is key since treatment is typically straightforward and cure rates are extremely high if we catch it early.   The first sign of melanoma is often a change in a mole or a new dark spot.  The ABCDE system is a way of remembering the signs of melanoma.  A for asymmetry:  The two halves do not match. B for border:  The edges of the growth are irregular. C for color:  A mixture of colors are present instead of an even brown color. D for diameter:  Melanomas are usually (but not always) greater than 6mm - the size of a pencil eraser. E for evolution:  The spot keeps changing in size, shape, and color.  Please check your skin once per month between visits. You can use a small mirror in front and a large mirror behind you to keep an eye on the back side or your body.   If you see any new or changing lesions before your next follow-up, please call to schedule a visit.  Please continue daily skin protection including broad spectrum sunscreen SPF 30+ to sun-exposed areas, reapplying every 2 hours as needed when you're outdoors.    Due to recent changes in healthcare laws, you may see results of your pathology and/or laboratory studies on MyChart before the doctors have had a chance to review them. We understand that in some cases there may be results that are confusing or concerning to you. Please understand that not all results are  received at the same time and often the doctors may need to interpret multiple results in order to provide you with the best plan of care or course of treatment. Therefore, we ask that you please give [place] 2 business days to thoroughly review all your results before contacting the office for clarification. Should we see a critical lab result, you will be contacted sooner.   If You Need Anything After Your Visit  If you have any questions or concerns for your doctor, please call our main line at [phone] and press option 4 to reach your doctor's medical assistant. If no one answers, please leave a voicemail as directed and we will return your call as soon as possible. Messages left after 4 pm will be answered the following business day.   You may also send [place] a message via MyChart. We typically respond to MyChart messages within 1-2 business days.  For prescription refills, please ask your pharmacy to contact our office. Our fax number is [phone].  If you have an urgent issue when the clinic is closed that cannot wait until the next business day, you can page your doctor at the number below.    Please note that while we do our best to be available for urgent issues outside of office hours, we are not available 24/7.   If you have an urgent issue and are unable to reach [place],  you may choose to seek medical care at your doctor's office, retail clinic, urgent care center, or emergency room.  If you have a medical emergency, please immediately call 911 or go to the emergency department.  Pager Numbers  - Dr. Gwen Pounds: [phone]  - Dr. Roseanne Reno: [phone]  - Dr. Katrinka Blazing: [phone]   In the event of inclement weather, please call our main line at [phone] for an update on the status of any delays or closures.  Dermatology Medication Tips: Please keep the boxes that topical medications come in in order to help keep track of the instructions about where and how to use these.  Pharmacies typically print the medication instructions only on the boxes and not directly on the medication tubes.   If your medication is too expensive, please contact our office at [phone] option 4 or send [place] a message through MyChart.   We are unable to tell what your co-pay for medications will be in advance as this is different depending on your insurance coverage. However, we may be able to find a substitute medication at lower cost or fill out paperwork to get insurance to cover a needed medication.   If a prior authorization is required to get your medication covered by your insurance company, please allow [place] 1-2 business days to complete this process.  Drug prices often vary depending on where the prescription is filled and some pharmacies may offer cheaper prices.  The website www.goodrx.com contains coupons for medications through different pharmacies. The prices here do not account for what the cost may be with help from insurance (it may be cheaper with your insurance), but the website can give you the price if you did not use any insurance.  - You can print the associated coupon and take it with your prescription to the pharmacy.  - You may also stop by our office during regular business hours and pick up a GoodRx coupon card.  - If you need your prescription sent electronically to a different pharmacy, notify our office through [facility] or by phone at [phone] option 4.     Si Usted Necesita Algo Despus de Su Visita  Tambin puede enviarnos un mensaje a travs de Clinical cytogeneticist. Por lo general respondemos a los mensajes de MyChart en el transcurso de 1 a 2 das hbiles.  Para renovar recetas, por favor pida a su farmacia que se ponga en contacto con nuestra oficina. Annie Sable de fax es [place] [phone].  Si tiene un asunto urgente cuando la clnica est cerrada y que no puede esperar hasta el siguiente da hbil, puede llamar/localizar a su doctor(a) al nmero  que aparece a continuacin.   Por favor, tenga en cuenta que aunque hacemos todo lo posible para estar disponibles para asuntos urgentes fuera del horario de [place], no estamos disponibles las 24 horas del da, los 7 [address]  [address] de la [place].   Si tiene un problema urgente y no puede comunicarse con nosotros, puede optar por buscar atencin mdica  en el consultorio de su doctor(a), en una clnica privada, en un centro de atencin urgente o en una sala de emergencias.  Si tiene Engineer, drilling, por favor llame inmediatamente al 911 o vaya a la sala de emergencias.  Nmeros de bper  - Dr. Gwen Pounds: [phone]  - Dra. Roseanne Reno: [phone]  - Dr. Katrinka Blazing: [phone]   En caso de inclemencias del tiempo, por favor llame a Lacy Duverney principal al [phone] para una actualizacin sobre el [place] de cualquier  retraso o cierre.  Consejos para la medicacin en dermatologa: Por favor, guarde las cajas en las que vienen los medicamentos de uso tpico para ayudarle a seguir las instrucciones sobre dnde y cmo usarlos. Las farmacias generalmente imprimen las instrucciones del medicamento slo en las cajas y no directamente en los tubos del [place].   Si su medicamento es muy caro, por favor, pngase en contacto con Rolm Gala llamando al [phone] y presione la opcin 4 o envenos un mensaje a travs de Clinical cytogeneticist.   No podemos decirle cul ser su copago por los medicamentos por adelantado ya que esto es diferente dependiendo de la cobertura de su seguro. Sin embargo, es posible que podamos encontrar un medicamento sustituto a Audiological scientist un formulario para que el seguro cubra el medicamento que se considera necesario.   Si se requiere una autorizacin previa para que su compaa de seguros [place] su medicamento, por favor permtanos de 1 a 2 das hbiles para completar [address]  Los precios de los medicamentos varan con frecuencia dependiendo del Environmental consultant de dnde se  surte la receta y alguna farmacias pueden ofrecer precios ms baratos.  El sitio web www.goodrx.com tiene cupones para medicamentos de Health and safety inspector. Los precios aqu no tienen en cuenta lo que podra costar con la ayuda del seguro (puede ser ms barato con su seguro), pero el sitio web puede darle el precio si no utiliz Tourist information centre manager.  - Puede imprimir el cupn correspondiente y llevarlo con su receta a la farmacia.  - Tambin puede pasar por nuestra oficina durante el horario de atencin regular y Education officer, museum una tarjeta de cupones de GoodRx.  - Si necesita que su receta se enve electrnicamente a una farmacia diferente, informe a nuestra oficina a travs de MyChart de [place] o por telfono llamando al [phone] y presione la opcin 4.

## 2023-09-16 ENCOUNTER — Encounter: Payer: BC Managed Care – PPO | Admitting: Dermatology

## 2023-10-08 DIAGNOSIS — Z944 Liver transplant status: Principal | ICD-10-CM

## 2023-10-08 DIAGNOSIS — Z796 Long-term use of immunosuppressant medication: Principal | ICD-10-CM

## 2023-10-08 MED ORDER — TACROLIMUS 1 MG CAPSULE, IMMEDIATE-RELEASE
ORAL_CAPSULE | 11 refills | 0.00 days | Status: CP
Start: 2023-10-08 — End: ?

## 2023-10-29 DIAGNOSIS — T8641 Liver transplant rejection: Principal | ICD-10-CM

## 2023-10-29 DIAGNOSIS — D849 Immunodeficiency, unspecified: Principal | ICD-10-CM

## 2023-10-29 DIAGNOSIS — Z944 Liver transplant status: Principal | ICD-10-CM

## 2023-10-29 MED ORDER — PREDNISONE 5 MG TABLET
ORAL_TABLET | Freq: Every day | ORAL | 11 refills | 30.00 days | Status: CP
Start: 2023-10-29 — End: ?

## 2023-11-30 ENCOUNTER — Ambulatory Visit
Admission: RE | Admit: 2023-11-30 | Discharge: 2023-11-30 | Disposition: A | Discharge status: Home / Self Care | Payer: Medicare PPO | Source: Ambulatory Visit | Attending: Infectious Diseases | Admitting: Infectious Diseases

## 2023-11-30 DIAGNOSIS — Z1231 Encounter for screening mammogram for malignant neoplasm of breast: Secondary | ICD-10-CM | POA: Diagnosis present

## 2023-12-11 DIAGNOSIS — Z944 Liver transplant status: Principal | ICD-10-CM

## 2023-12-15 ENCOUNTER — Ambulatory Visit: Admit: 2023-12-15 | Discharge: 2023-12-16 | Payer: MEDICARE

## 2023-12-17 DIAGNOSIS — Z944 Liver transplant status: Principal | ICD-10-CM

## 2024-03-16 DIAGNOSIS — Z944 Liver transplant status: Principal | ICD-10-CM

## 2024-03-28 ENCOUNTER — Ambulatory Visit: Admit: 2024-03-28 | Discharge: 2024-03-29 | Payer: Medicare (Managed Care)

## 2024-03-28 ENCOUNTER — Encounter: Admit: 2024-03-28 | Discharge: 2024-03-29 | Payer: Medicare (Managed Care)

## 2024-03-28 DIAGNOSIS — D649 Anemia, unspecified: Principal | ICD-10-CM

## 2024-03-28 DIAGNOSIS — Z944 Liver transplant status: Principal | ICD-10-CM

## 2024-05-05 ENCOUNTER — Ambulatory Visit: Admit: 2024-05-05 | Discharge: 2024-05-06 | Payer: Medicare (Managed Care)

## 2024-05-16 DIAGNOSIS — Z1159 Encounter for screening for other viral diseases: Principal | ICD-10-CM

## 2024-05-16 DIAGNOSIS — Z0184 Encounter for antibody response examination: Principal | ICD-10-CM

## 2024-05-16 DIAGNOSIS — R5383 Other fatigue: Principal | ICD-10-CM

## 2024-05-16 DIAGNOSIS — E611 Iron deficiency: Principal | ICD-10-CM

## 2024-05-16 DIAGNOSIS — Z944 Liver transplant status: Principal | ICD-10-CM

## 2024-05-16 DIAGNOSIS — Z79899 Other long term (current) drug therapy: Principal | ICD-10-CM

## 2024-05-16 DIAGNOSIS — D649 Anemia, unspecified: Principal | ICD-10-CM

## 2024-05-16 MED ORDER — PEG 3350-ELECTROLYTES 236 GRAM-22.74 GRAM-6.74 GRAM-5.86 GRAM SOLUTION
0 refills | 0.00000 days | Status: CP
Start: 2024-05-16 — End: ?

## 2024-05-23 ENCOUNTER — Other Ambulatory Visit: Payer: Self-pay | Admitting: Family Medicine

## 2024-05-23 DIAGNOSIS — M5412 Radiculopathy, cervical region: Secondary | ICD-10-CM

## 2024-05-25 ENCOUNTER — Ambulatory Visit
Admission: RE | Admit: 2024-05-25 | Discharge: 2024-05-25 | Disposition: A | Discharge status: Home / Self Care | Source: Ambulatory Visit | Attending: Family Medicine | Admitting: Family Medicine

## 2024-05-25 DIAGNOSIS — M5412 Radiculopathy, cervical region: Secondary | ICD-10-CM

## 2024-05-26 DIAGNOSIS — R1011 Right upper quadrant pain: Principal | ICD-10-CM

## 2024-05-26 DIAGNOSIS — Z944 Liver transplant status: Principal | ICD-10-CM

## 2024-06-02 ENCOUNTER — Inpatient Hospital Stay: Admit: 2024-06-02 | Discharge: 2024-06-02 | Payer: Medicare (Managed Care)

## 2024-06-03 DIAGNOSIS — Z944 Liver transplant status: Principal | ICD-10-CM

## 2024-06-07 NOTE — Progress Notes (Unsigned)
 Referring Physician:  Avanell Katz, MD [address] [place],  [place] [address]  Primary Physician:  Epifanio Alm SQUIBB, MD  History of Present Illness: 06/08/2024 Barbara Moss is here today with a chief complaint of left-sided neck pain that radiates into her left trapezius as well as into the back of her head.  She denies radiation down to her hand.  She is having increased issues turning her neck that has become significantly worse over the past 6 weeks.  Her headaches have become much more frequent and she is also noticed that she is becoming increasingly unsteady on her feet feeling as though she is going to fall.  She is also noticed that her handwriting has become progressively worse.  Duration: 2017-2018 Weakness: none Bowel/Bladder Dysfunction: none  Conservative measures:  Physical therapy:  has participated in the past at [facility] but no PT in 2025 or 2024 Multimodal medical therapy including regular antiinflammatories:  tramadol, methocarbamol, hydrocodone, tylenol  Injections:   01/28/2018: Left C4-5 transforaminal ESI  10/08/2017: Left C4-5 transforaminal ESI (good relief 1 month) 08/17/2017: Left C4-5 transforaminal ESI (good relief)   Past Surgery: no spinal surgeries   Barbara Moss has  symptoms of cervical myelopathy.  The symptoms are causing a significant impact on the patient's life.   Review of Systems:  A 10 point review of systems is negative, except for the pertinent positives and negatives detailed in the HPI.  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   GERD (gastroesophageal reflux disease)    Hand discomfort 12/20/2014   Hearing decreased    History of MRSA infection    left thigh   HOH (hard of hearing)    Right is worse/ uses aid in  Right   Hypothyroidism    Iron deficiency anemia 03/19/2015   Liver transplanted (HCC) 2019   Motion sickness    on a boat ride   Neck pain    Sleep apnea 03/19/2015   no CPAP, mild case   Spontaneous bacterial peritonitis (HCC)    Thyroid disease 03/19/2015   Vertigo     Past Surgical History: Past Surgical History:  Procedure Laterality Date   ABDOMINAL HYSTERECTOMY     Bladder tack     COLONOSCOPY WITH PROPOFOL  N/A 04/24/2016   Procedure: COLONOSCOPY WITH PROPOFOL ;  Surgeon: Lamar ONEIDA Holmes, MD;  Location: [facility] ENDOSCOPY;  Service: Endoscopy;  Laterality: N/A;   INNER EAR SURGERY Bilateral 03/19/2012   LIVER TRANSPLANT  2019   NASAL SINUS SURGERY     SALPINGOOPHORECTOMY     Tympanoplasty with mastoidectomy      Allergies: Allergies as of 06/08/2024   (No Known Allergies)    Medications: Outpatient Encounter Medications as of 06/08/2024  Medication Sig   acetaminophen (TYLENOL) 500 MG tablet Take 500 mg by mouth as needed.   calcium-vitamin D (OSCAL WITH D) 500-200 MG-UNIT per tablet Take 1 tablet by mouth.   Cholecalciferol 50 MCG (2000 UT) TABS Take 2,000 Units by mouth daily.   Golimumab 50 MG/0.5ML SOAJ Inject into the skin.   HYDROcodone-acetaminophen (NORCO/VICODIN) 5-325 MG tablet Take 1 tablet by mouth every 4 (four) hours as needed.   hydroxychloroquine (PLAQUENIL) 200 MG tablet Take 200 mg by mouth daily.   levothyroxine (SYNTHROID) 75 MCG  tablet Take 75 mcg by mouth daily before breakfast.   omeprazole (PRILOSEC) 40 MG capsule TAKE 1 CAPSULE BY MOUTH ONCE DAILY FOR REFLUX.   polyethylene glycol powder (GLYCOLAX/MIRALAX) 17 GM/SCOOP powder Take 17 g by mouth as needed.   predniSONE  (DELTASONE ) 5 MG tablet Take 1 tablet by mouth daily.   PROLIA 60 MG/ML SOSY injection Inject into the skin.   tacrolimus (PROGRAF) 1 MG capsule Take 2 capsules by mouth in the morning and at bedtime.   Vibegron  (GEMTESA ) 75 MG TABS Take 1 tablet (75 mg total) by mouth daily.    methocarbamol (ROBAXIN) 500 MG tablet 1/2-1 po qBID prn (Patient not taking: Reported on 06/08/2024)   polyethylene glycol (GOLYTELY) 236 g solution Take by mouth as directed per [facility] GI prep instructions, for split bowel prep. (Patient not taking: Reported on 06/08/2024)   traMADol (ULTRAM) 50 MG tablet Take 50 mg by mouth as needed. (Patient not taking: Reported on 06/08/2024)   [DISCONTINUED] ciprofloxacin  (CIPRO ) 500 MG tablet Take 1 tablet the day before, 1 tablet the day of and 1 tablet the day after BOTOX  (Patient not taking: Reported on 02/04/2023)   [DISCONTINUED] fluorouracil  (EFUDEX ) 5 % cream Apply topically 2 (two) times daily. For 7 days to right cheek as directed (Patient not taking: Reported on 02/04/2023)   [DISCONTINUED] levothyroxine (SYNTHROID, LEVOTHROID) 88 MCG tablet Take 88 mcg by mouth daily before breakfast.   No facility-administered encounter medications on file as of 06/08/2024.    Social History: Social History   Tobacco Use   Smoking status: Former    Current packs/day: 0.00    Average packs/day: 0.5 packs/day for 10.0 years (5.0 ttl pk-yrs)    Types: Cigarettes    Start date: 29    Quit date: 1991    Years since quitting: 34.6    Passive exposure: Never   Smokeless tobacco: Never  Substance Use Topics   Alcohol use: Not Currently   Drug use: No    Family Medical History: Family History  Problem Relation Age of Onset   Heart disease Father    Arthritis Sister    Heart disease Mother    Thyroid disease Mother    Cancer Sister    Heart disease Maternal Grandmother    Heart disease Maternal Grandfather    Colon cancer Maternal Aunt    Breast cancer Neg Hx     Physical Examination: @VITALWITHPAIN @  General: Patient is well developed, well nourished, calm, collected, and in no apparent distress. Attention to examination is appropriate.  Psychiatric: Patient is non-anxious.  Head:  Pupils equal, round, and reactive to light.  ENT:  Oral mucosa  appears well hydrated.  Neck:   Supple.  Decreased range of motion.  Respiratory: Patient is breathing without any difficulty.  Extremities: No edema.  Vascular: Palpable dorsal pedal pulses.  Skin:   On exposed skin, there are no abnormal skin lesions.  NEUROLOGICAL:     Awake, alert, oriented to person, place, and time.  Speech is clear and fluent. Fund of knowledge is appropriate.   Cranial Nerves: Pupils equal round and reactive to light.  Facial tone is symmetric.   ROM of spine: Decreased range of motion of cervical spine.  Some tenderness to palpation of cervical paraspinals.  Strength: Side Biceps Triceps Deltoid Interossei Grip Wrist Ext. Wrist Flex.  R 5 5 5 5 5 5 5   L 5 5 5  4+ 4 4+ 5    +Hoffmans  Reflexes are 2+ and symmetric  at the biceps, triceps, brachioradialis. Bilateral upper and lower extremity sensation is intact to light touch.    Slight difficulty with tandem gait.  Medical Decision Making  Imaging: BONES AND ALIGNMENT: There is slight reversal of the normal cervical lordosis centered at C3-4. There is trace stepwise retrolisthesis of C3 on C4, C4 on C5, and C5 on C6. Degenerative endplate irregularity and endplate osteophytes at multiple levels. There is no evidence of fracture or suspicious osseous lesion.   SPINAL CORD: The cervical cord is normal in caliber. Subtle cord signal abnormality at C5-6 suggestive of myelomalacia.   SOFT TISSUES: The visualized posterior fossa is unremarkable.   C2-C3: There is a central disc protrusion which indents the ventral thecal sac and contacts the ventral cord without flattening or cord signal abnormality. Bilateral facet arthrosis. Mild bilateral foraminal stenosis.   C3-C4: There is a disc osteophyte complex which indents the ventral thecal sac with flattening of the ventral cord, similar to prior. Mild spinal canal stenosis. Bilateral facet arthrosis. Uncovertebral hypertrophy on the right. There  is moderate right foraminal stenosis, increased from prior.   C4-C5: There is a disc osteophyte complex slightly eccentric to the right which indents the ventral thecal sac with flattening of the ventral cord, which is increased from prior. Thickening of the ligaments and flavum. Mild spinal canal stenosis, slightly increased from prior. Bilateral facet arthrosis. Uncovertebral hypertrophy more pronounced on the right. There is severe right and moderate to severe left foraminal stenosis, increased from prior.   C5-C6: There is a disc osteophyte complex which abuts the ventral cord with flattening of the cord. Thickening of the ligaments and flavum abuts the dorsal cord. Severe spinal canal stenosis with mild cord compression. Bilateral facet arthrosis and uncovertebral hypertrophy with severe bilateral foraminal stenosis, increased from prior.   C6-C7: There is a disc osteophyte complex eccentric to the right which abuts and flattens the ventral cord, slightly increased from prior. Thickening of the ligaments and flavum. Moderate spinal canal stenosis. Bilateral facet arthrosis. Uncovertebral hypertrophy greater on the left. There is severe left foraminal stenosis which is increased.   C7-T1: There is a disc bulge which indents the ventral thecal sac, slightly increased from prior. Bilateral facet arthrosis. There is mild right foraminal stenosis.   IMPRESSION: 1. Degenerative changes as above, increased since 2018. 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 2. Severe bilateral foraminal stenosis at C5-C6, increased from prior. 3. Severe right and moderate to severe left foraminal stenosis at C4-C5, increased from prior. 4. Severe left foraminal stenosis at C6-C7, increased from prior. 5. Moderate right foraminal stenosis at C3-C4, increased from prior.    I have personally reviewed the images  and agree with the above interpretation.  Assessment and Plan: Barbara Moss is a pleasant 68 y.o. female is here today with a chief complaint of left-sided neck pain that radiates into her left trapezius as well as into the back of her head.  She denies radiation down to her hand.  She is having increased issues turning her neck that has become significantly worse over the past 6 weeks.  Her headaches have become much more frequent and she is also noticed that she is becoming increasingly unsteady on her feet feeling as though she is going to fall.  She is also noticed that her handwriting has become progressively worse.  Exam is as above.  Patient does have some slight weakness.  Positive Hoffmann  sign.  MRI showing severe stenosis in her cervical spine with cord compression at C5-6.  Pleasure to see patient in clinic today.  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  At this point it would be indicated for patient to undergo decompression surgery, but she would like to pursue conservative treatment at this time.  Will plan for dynamic x-rays of cervical spine today for review.  Due to her underlying myelopathy, although mild, we will continue follow-up with her.  Plan to see her back in approximately 10 weeks.  She would like to go back to Dr. Avanell for an injection of her cervical spine.   Thank you for involving me in the care of this patient.   I spent a total of 45 minutes in both face-to-face and non-face-to-face activities for this visit on the date of this encounter. including preparing to see the patient, obtaining and reviewing separately obtained history, performing medically appropriate examination, counseling the patient, ordering additional medications and tests, documenting clinical information, independently interpreting results, coordination of care.   Lyle Decamp, PA-C Dept. of Neurosurgery

## 2024-06-08 ENCOUNTER — Ambulatory Visit
Admission: RE | Admit: 2024-06-08 | Discharge: 2024-06-08 | Disposition: A | Discharge status: Home / Self Care | Attending: Physician Assistant | Admitting: Physician Assistant

## 2024-06-08 ENCOUNTER — Ambulatory Visit: Admitting: Physician Assistant

## 2024-06-08 ENCOUNTER — Ambulatory Visit
Admission: RE | Admit: 2024-06-08 | Discharge: 2024-06-08 | Disposition: A | Discharge status: Home / Self Care | Source: Ambulatory Visit | Attending: Physician Assistant | Admitting: Physician Assistant

## 2024-06-08 ENCOUNTER — Encounter: Payer: Self-pay | Admitting: Physician Assistant

## 2024-06-08 VITALS — BP 130/84 | Ht <= 58 in | Wt 140.5 lb

## 2024-06-08 DIAGNOSIS — M5412 Radiculopathy, cervical region: Secondary | ICD-10-CM | POA: Diagnosis not present

## 2024-06-08 DIAGNOSIS — M4802 Spinal stenosis, cervical region: Secondary | ICD-10-CM

## 2024-06-08 DIAGNOSIS — G549 Nerve root and plexus disorder, unspecified: Secondary | ICD-10-CM

## 2024-06-08 DIAGNOSIS — G9589 Other specified diseases of spinal cord: Secondary | ICD-10-CM | POA: Diagnosis not present

## 2024-06-15 ENCOUNTER — Telehealth: Payer: Self-pay

## 2024-06-15 NOTE — Telephone Encounter (Signed)
 Pt called asking for a refill for Gemtesa . Pt was informed that since she hasn't been since 07/2022 we would not be able to give her samples. Scheduled her for a ov with a pa.

## 2024-06-21 ENCOUNTER — Ambulatory Visit: Admitting: Physician Assistant

## 2024-06-21 ENCOUNTER — Ambulatory Visit: Payer: Self-pay | Admitting: Physician Assistant

## 2024-06-21 VITALS — BP 129/72 | HR 80 | Ht <= 58 in | Wt 137.4 lb

## 2024-06-21 DIAGNOSIS — N281 Cyst of kidney, acquired: Secondary | ICD-10-CM | POA: Diagnosis not present

## 2024-06-21 DIAGNOSIS — N3946 Mixed incontinence: Secondary | ICD-10-CM

## 2024-06-21 DIAGNOSIS — R300 Dysuria: Secondary | ICD-10-CM

## 2024-06-21 LAB — MICROSCOPIC EXAMINATION: WBC, UA: >30 /HPF — AB (ref 0–5)

## 2024-06-21 LAB — URINALYSIS, COMPLETE
Bilirubin, UA: NEGATIVE
Glucose, UA: NEGATIVE
Ketones, UA: NEGATIVE
Nitrite, UA: NEGATIVE
Protein,UA: NEGATIVE
Specific Gravity, UA: 1.01 (ref 1.005–1.030)
Urobilinogen, Ur: 0.2 mg/dL (ref 0.2–1.0)
pH, UA: 6 (ref 5.0–7.5)

## 2024-06-21 MED ORDER — GEMTESA 75 MG PO TABS
75.0000 mg | ORAL_TABLET | Freq: Every day | ORAL | 11 refills | Status: AC
Start: 2024-06-21 — End: ?

## 2024-06-21 NOTE — Progress Notes (Signed)
 06/21/2024 1:44 PM   Barbara Moss 10-28-1955 [id number]  CC: No chief complaint on file.  HPI: Barbara Moss is a 68 y.o. 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 Gaston who presents today for annual follow-up.   Today she reports her symptoms have been very well-managed on Gemtesa  alone, and she does not think she is due for another Botox  treatment.  She ran out of Gemtesa  about 3 weeks ago, and her symptoms have worsened since.  She reports 2 weeks of urinary urgency with low volume output, dysuria, and midline low back pain with concern for UTI.  No stone history.  She had a recent liver ultrasound for surveillance, which mentioned a 1.6 cm right upper pole renal cyst and wonders if this is something she should be concerned about.  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   GERD (gastroesophageal reflux disease)    Hand discomfort 12/20/2014   Hearing decreased    History of MRSA infection    left thigh   HOH (hard of hearing)    Right is worse/ uses aid in Right   Hypothyroidism    Iron deficiency anemia 03/19/2015   Liver transplanted (HCC) 2019   Motion sickness    on a boat ride   Neck pain    Sleep apnea 03/19/2015   no CPAP, mild case   Spontaneous bacterial peritonitis (HCC)    Thyroid disease 03/19/2015   Vertigo     Surgical History: Past Surgical History:  Procedure Laterality Date   ABDOMINAL HYSTERECTOMY     Bladder tack     COLONOSCOPY WITH PROPOFOL  N/A 04/24/2016   Procedure: COLONOSCOPY WITH PROPOFOL ;  Surgeon: Lamar Barbara Holmes, MD;  Location: [facility] ENDOSCOPY;  Service: Endoscopy;  Laterality: N/A;   INNER EAR SURGERY Bilateral 03/19/2012   LIVER TRANSPLANT  2019   NASAL SINUS SURGERY     SALPINGOOPHORECTOMY      Tympanoplasty with mastoidectomy      Home Medications:  Allergies as of 06/21/2024   No Known Allergies      Medication List        Accurate as of June 21, 2024  1:44 PM. If you have any questions, ask your nurse or doctor.          acetaminophen 500 MG tablet Commonly known as: TYLENOL Take 500 mg by mouth as needed.   calcium-vitamin D 500-200 MG-UNIT tablet Commonly known as: OSCAL WITH D Take 1 tablet by mouth.   Cholecalciferol 50 MCG (2000 UT) Tabs Take 2,000 Units by mouth daily.   Gemtesa  75 MG Tabs Generic drug: Vibegron  Take 1 tablet (75 mg total) by mouth daily.   Golimumab 50 MG/0.5ML Soaj Inject into the skin.   HYDROcodone-acetaminophen 5-325 MG tablet Commonly known as: NORCO/VICODIN Take 1 tablet by mouth every 4 (four) hours as needed.   hydroxychloroquine 200 MG tablet Commonly known as: PLAQUENIL Take 200 mg by mouth daily.   levothyroxine 75 MCG tablet Commonly known as: SYNTHROID Take 75 mcg by mouth daily before breakfast.   methocarbamol 500 MG tablet Commonly known as: ROBAXIN 1/2-1 po qBID prn   omeprazole 40 MG capsule Commonly known as: PRILOSEC TAKE 1 CAPSULE BY MOUTH ONCE DAILY FOR REFLUX.  polyethylene glycol 236 g solution Commonly known as: GoLYTELY Take by mouth as directed per [facility] GI prep instructions, for split bowel prep.   polyethylene glycol powder 17 GM/SCOOP powder Commonly known as: GLYCOLAX/MIRALAX Take 17 g by mouth as needed.   predniSONE  5 MG tablet Commonly known as: DELTASONE  Take 1 tablet by mouth daily.   Prolia 60 MG/ML Sosy injection Generic drug: denosumab Inject into the skin.   tacrolimus 1 MG capsule Commonly known as: PROGRAF Take 2 capsules by mouth in the morning and at bedtime.   traMADol 50 MG tablet Commonly known as: ULTRAM Take 50 mg by mouth as needed.        Allergies:  No Known Allergies  Family History: Family History  Problem Relation Age of Onset   Heart  disease Father    Arthritis Sister    Heart disease Mother    Thyroid disease Mother    Cancer Sister    Heart disease Maternal Grandmother    Heart disease Maternal Grandfather    Colon cancer Maternal Aunt    Breast cancer Neg Hx     Social History:   reports that she quit smoking about 34 years ago. Her smoking use included cigarettes. She started smoking about 44 years ago. She has a 5 pack-year smoking history. She has never been exposed to tobacco smoke. She has never used smokeless tobacco. She reports that she does not currently use alcohol. She reports that she does not use drugs.  Physical Exam: There were no vitals taken for this visit.  Constitutional:  Alert and oriented, no acute distress, nontoxic appearing HEENT: [place], AT Cardiovascular: No clubbing, cyanosis, or edema Respiratory: Normal respiratory effort, no increased work of breathing Skin: No rashes, bruises or suspicious lesions Neurologic: Grossly intact, no focal deficits, moving all 4 extremities Psychiatric: Normal mood and affect  Assessment & Plan:   1. Mixed incontinence (Primary) Symptoms well-controlled on Gemtesa , will refill.  May consider additional Botox  treatments in the future if things worsen. - Vibegron  (GEMTESA ) 75 MG TABS; Take 1 tablet (75 mg total) by mouth daily.  Dispense: 30 tablet; Refill: 11  2. Dysuria 2 weeks of frequency, dysuria, and midline low back pain.  I asked her to leave a UA on the way out and we will contact her with results, treating as indicated. - Urinalysis, Complete  3. Renal cyst, right Small renal cyst seen incidentally on liver ultrasound.  I am not able to personally review the original images, though even if this represented a complex cyst I think surveillance would be appropriate given size.  Will continue to monitor.  If it starts to grow on subsequent imaging, will obtain cross-sectional imaging for further evaluation.  Return in about 1 year (around 06/21/2025)  for Annual OAB f/u with [facility], Will call with results.  Lucie Hones, PA-C  [facility] [address], [address] [place], [place] [address] [phone]

## 2024-06-22 ENCOUNTER — Other Ambulatory Visit: Payer: Self-pay | Admitting: Physician Assistant

## 2024-06-22 MED ORDER — NITROFURANTOIN MONOHYD MACRO 100 MG PO CAPS: 100.0000 mg | ORAL_CAPSULE | Freq: Two times a day (BID) | ORAL | 0 refills | Status: AC

## 2024-06-29 LAB — CULTURE, URINE COMPREHENSIVE

## 2024-07-04 ENCOUNTER — Ambulatory Visit: Admit: 2024-07-04 | Discharge: 2024-07-05 | Payer: Medicare (Managed Care)

## 2024-07-04 DIAGNOSIS — Z944 Liver transplant status: Principal | ICD-10-CM

## 2024-07-05 DIAGNOSIS — Z944 Liver transplant status: Principal | ICD-10-CM

## 2024-07-07 DIAGNOSIS — R300 Dysuria: Secondary | ICD-10-CM

## 2024-07-08 ENCOUNTER — Telehealth: Payer: Self-pay

## 2024-07-08 MED ORDER — SULFAMETHOXAZOLE-TRIMETHOPRIM 800-160 MG PO TABS: 1.0000 | ORAL_TABLET | Freq: Two times a day (BID) | ORAL | 0 refills | Status: AC

## 2024-07-08 NOTE — Telephone Encounter (Signed)
 Pt called triage line requesting additional ATB for UTI. She did send a mychart message yesterday at 8am. NO response.   Pt was seen by [facility] 9/10. + uti given macrobid  bid x 5. Completed on 9/15. 9/18 the urgency and burning returned.   + pushing fluids +cranberry juice + AZO- minimal relief.   Pt is requesting additional ATB. Aware [facility] is off. Will send to [facility].

## 2024-07-08 NOTE — Telephone Encounter (Signed)
 Informed pt however [facility] has sent in a ATB. Pt will complete ATB and call back if needed.

## 2024-07-28 ENCOUNTER — Inpatient Hospital Stay: Admit: 2024-07-28 | Discharge: 2024-07-28 | Payer: Medicare (Managed Care)

## 2024-07-28 ENCOUNTER — Encounter: Admit: 2024-07-28 | Discharge: 2024-07-28 | Payer: Medicare (Managed Care)

## 2024-08-08 NOTE — Progress Notes (Signed)
 Referring Physician:  Avanell Katz, MD [address] [place],  [place] [address]  Primary Physician:  Epifanio Alm SQUIBB, MD  History of Present Illness: 08/17/2024  Patient is here to see me again.  We have been following her for development of myelomalacia.  She feels like her walking is not getting any better and continues to have issues.  She also has significant neck pain which has not gotten any better.  She does feel like her hands are slowly getting worse as far as her dexterity goes.  She feels like she gets continued posterior headaches which seem to be cervicogenic in nature, she does carry a history of osteopenia  Conservative measures:  Physical therapy:  has participated in the past at [facility] but no PT in 2025 or 2024 Multimodal medical therapy including regular antiinflammatories:  tramadol, methocarbamol, hydrocodone, tylenol  Injections:   08/04/2024: Left C4-5 transforaminal ESI (dexamethasone 13 mg) 07/01/2024: Left C4-5 transforaminal ESI (50% relief for 10 days, dexamethasone 10 mg) 09/22/2023: Left S1 transforaminal ESI (80% relief) 09/08/2023: left S1 trigger point injection (moderate relief) 05/13/2023: left trapezius ridge trigger point injection 02/06/2022: Left S1 transforaminal ESI (90% relief) 10/09/2021: Right subacromial injection (good relief) 09/21/2020: RFA to the bilateral L4-5 and L5-S1 facet joints (98% relief) 08/07/2020: MBB to the bilateral L4-5 and L5-S1 facet joints (7/10 to 1/10) 07/24/2020: MBB to the bilateral L4-5 and L5-S1 facet joints (8/10 to 1/10) 07/02/2020: Bilateral L5 trigger point injections (moderate to good relief) 05/02/2020: Left S1 transforaminal ESI (brief increase in pain and moderate relief)  03/30/2020: Left L5 trigger point injection (good relief) 03/16/2020: Right subacromial injection (moderate relief) 11/18/2019: Bilateral superior gluteal trigger point injections (good relief for 3  months) 07/22/2019: Bilateral superior gluteal trigger point injections (good relief for approximately 3.5 months) 04/22/2019: Bilateral superior gluteal trigger point injections (moderate relief x1 to 2 weeks) 03/04/2019: Bilateral S1 transforaminal ESI (50% relief) 12/17/2018: Bilateral S1 transforaminal ESI (improvement of posterior buttock and posterior thigh pain) 11/22/2018: Bilateral S1 transforaminal ESI (improvement of posterior buttock and posterior thigh pain) 01/28/2018: Left C4-5 transforaminal ESI 12/28/2017: Left trapezius ridge trigger point injection 11/02/2017: Left trapezius ridge trigger point injection (good relief for 1 month) 10/08/2017: Left C4-5 transforaminal ESI (good relief 1 month) 08/17/2017: Left C4-5 transforaminal ESI (good relief)   Past Surgery: no spinal surgeries   Barbara Moss has  symptoms of cervical myelopathy.  The symptoms are causing a significant impact on the patient's life.   Review of Systems:  A 10 point review of systems is negative, except for the pertinent positives and negatives detailed in the HPI.  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   GERD (gastroesophageal reflux disease)    Hand discomfort 12/20/2014   Hearing decreased    History of MRSA infection    left thigh   HOH (hard of hearing)    Right is worse/ uses aid in Right   Hypothyroidism    Iron deficiency anemia 03/19/2015   Liver transplanted (HCC) 2019   Motion sickness    on a boat ride   Neck pain    Sleep apnea 03/19/2015   no CPAP, mild case   Spontaneous bacterial peritonitis (HCC)    Thyroid disease 03/19/2015   Vertigo  Past Surgical History: Past Surgical History:  Procedure Laterality Date   ABDOMINAL HYSTERECTOMY     Bladder tack     COLONOSCOPY WITH  PROPOFOL  N/A 04/24/2016   Procedure: COLONOSCOPY WITH PROPOFOL ;  Surgeon: Lamar ONEIDA Holmes, MD;  Location: [facility] ENDOSCOPY;  Service: Endoscopy;  Laterality: N/A;   INNER EAR SURGERY Bilateral 03/19/2012   LIVER TRANSPLANT  2019   NASAL SINUS SURGERY     SALPINGOOPHORECTOMY     Tympanoplasty with mastoidectomy      Allergies: Allergies as of 06/08/2024   (No Known Allergies)    Medications: Outpatient Encounter Medications as of 06/08/2024  Medication Sig   acetaminophen (TYLENOL) 500 MG tablet Take 500 mg by mouth as needed.   calcium-vitamin D (OSCAL WITH D) 500-200 MG-UNIT per tablet Take 1 tablet by mouth.   Cholecalciferol 50 MCG (2000 UT) TABS Take 2,000 Units by mouth daily.   Golimumab 50 MG/0.5ML SOAJ Inject into the skin.   HYDROcodone-acetaminophen (NORCO/VICODIN) 5-325 MG tablet Take 1 tablet by mouth every 4 (four) hours as needed.   hydroxychloroquine (PLAQUENIL) 200 MG tablet Take 200 mg by mouth daily.   levothyroxine (SYNTHROID) 75 MCG tablet Take 75 mcg by mouth daily before breakfast.   omeprazole (PRILOSEC) 40 MG capsule TAKE 1 CAPSULE BY MOUTH ONCE DAILY FOR REFLUX.   polyethylene glycol powder (GLYCOLAX/MIRALAX) 17 GM/SCOOP powder Take 17 g by mouth as needed.   predniSONE  (DELTASONE ) 5 MG tablet Take 1 tablet by mouth daily.   PROLIA 60 MG/ML SOSY injection Inject into the skin.   tacrolimus (PROGRAF) 1 MG capsule Take 2 capsules by mouth in the morning and at bedtime.   Vibegron  (GEMTESA ) 75 MG TABS Take 1 tablet (75 mg total) by mouth daily.   methocarbamol (ROBAXIN) 500 MG tablet 1/2-1 po qBID prn (Patient not taking: Reported on 06/08/2024)   polyethylene glycol (GOLYTELY) 236 g solution Take by mouth as directed per [facility] GI prep instructions, for split bowel prep. (Patient not taking: Reported on 06/08/2024)   traMADol (ULTRAM) 50 MG tablet Take 50 mg by mouth as needed. (Patient not taking: Reported on 06/08/2024)   [DISCONTINUED] ciprofloxacin  (CIPRO ) 500 MG  tablet Take 1 tablet the day before, 1 tablet the day of and 1 tablet the day after BOTOX  (Patient not taking: Reported on 02/04/2023)   [DISCONTINUED] fluorouracil  (EFUDEX ) 5 % cream Apply topically 2 (two) times daily. For 7 days to right cheek as directed (Patient not taking: Reported on 02/04/2023)   [DISCONTINUED] levothyroxine (SYNTHROID, LEVOTHROID) 88 MCG tablet Take 88 mcg by mouth daily before breakfast.   No facility-administered encounter medications on file as of 06/08/2024.    Social History: Social History   Tobacco Use   Smoking status: Former    Current packs/day: 0.00    Average packs/day: 0.5 packs/day for 10.0 years (5.0 ttl pk-yrs)    Types: Cigarettes    Start date: 14    Quit date: 1991    Years since quitting: 34.6    Passive exposure: Never   Smokeless tobacco: Never  Substance Use Topics   Alcohol use: Not Currently   Drug use: No    Family Medical History: Family History  Problem Relation Age of Onset   Heart disease Father    Arthritis Sister    Heart disease Mother    Thyroid disease Mother    Cancer Sister    Heart disease Maternal Grandmother    Heart disease Maternal Grandfather    Colon cancer Maternal  Aunt    Breast cancer Neg Hx     Physical Examination:  NEUROLOGICAL:     Awake, alert, oriented to person, place, and time.  Speech is clear and fluent. Fund of knowledge is appropriate.   Cranial Nerves: Pupils equal round and reactive to light.  Facial tone is symmetric.   ROM of spine: Decreased range of motion of cervical spine.  Some tenderness to palpation of cervical paraspinals.  Strength: Side Biceps Triceps Deltoid Interossei Grip Wrist Ext. Wrist Flex.  R 5 5 5 4 4 5 5   L 5 5 5  4+ 4 4+ 5    +Hoffmans  Reflexes are 2+ and symmetric at the biceps, triceps, brachioradialis. Bilateral upper and lower extremity sensation is intact to light touch.    Slight difficulty with tandem gait. Development of hyperreflexia with a  positive Hoffmann sign as well as crossed adductors on the right [place] are 3+ on the right and 2+ on the left  Medical Decision Making  Imaging: BONES AND ALIGNMENT: There is slight reversal of the normal cervical lordosis centered at C3-4. There is trace stepwise retrolisthesis of C3 on C4, C4 on C5, and C5 on C6. Degenerative endplate irregularity and endplate osteophytes at multiple levels. There is no evidence of fracture or suspicious osseous lesion.   SPINAL CORD: The cervical cord is normal in caliber. Subtle cord signal abnormality at C5-6 suggestive of myelomalacia.   SOFT TISSUES: The visualized posterior fossa is unremarkable.   C2-C3: There is a central disc protrusion which indents the ventral thecal sac and contacts the ventral cord without flattening or cord signal abnormality. Bilateral facet arthrosis. Mild bilateral foraminal stenosis.   C3-C4: There is a disc osteophyte complex which indents the ventral thecal sac with flattening of the ventral cord, similar to prior. Mild spinal canal stenosis. Bilateral facet arthrosis. Uncovertebral hypertrophy on the right. There is moderate right foraminal stenosis, increased from prior.   C4-C5: There is a disc osteophyte complex slightly eccentric to the right which indents the ventral thecal sac with flattening of the ventral cord, which is increased from prior. Thickening of the ligaments and flavum. Mild spinal canal stenosis, slightly increased from prior. Bilateral facet arthrosis. Uncovertebral hypertrophy more pronounced on the right. There is severe right and moderate to severe left foraminal stenosis, increased from prior.   C5-C6: There is a disc osteophyte complex which abuts the ventral cord with flattening of the cord. Thickening of the ligaments and flavum abuts the dorsal cord. Severe spinal canal stenosis with mild cord compression. Bilateral facet arthrosis and uncovertebral hypertrophy with  severe bilateral foraminal stenosis, increased from prior.   C6-C7: There is a disc osteophyte complex eccentric to the right which abuts and flattens the ventral cord, slightly increased from prior. Thickening of the ligaments and flavum. Moderate spinal canal stenosis. Bilateral facet arthrosis. Uncovertebral hypertrophy greater on the left. There is severe left foraminal stenosis which is increased.   C7-T1: There is a disc bulge which indents the ventral thecal sac, slightly increased from prior. Bilateral facet arthrosis. There is mild right foraminal stenosis.   IMPRESSION: 1. Degenerative changes as above, increased since 2018. 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 2. Severe bilateral foraminal stenosis at C5-C6, increased from prior. 3. Severe right and moderate to severe left foraminal stenosis at C4-C5, increased from prior. 4. Severe left foraminal stenosis at C6-C7, increased from prior. 5. Moderate right foraminal stenosis  at C3-C4, increased from prior.    I have personally reviewed the images and agree with the above interpretation.  Assessment and Plan: Barbara Moss is a pleasant 68 y.o. female is here today with a chief complaint of left-sided neck pain that radiates into her left trapezius as well as into the back of her head.  We have been following her for myelomalacia development given her existing severe stenosis at C5-6 she is developing early signs of myelomalacia/myelopathy at this time.    She is having worsening predominantly on her right upper extremity.  She is noticing weakness numbness and tingling.  She is also not exercising some issues with her gait she is also developing some hyperreflexia on examination consistent with cervical myelopathy.  Given her imaging findings it is consistent with the severe stenosis especially at C5-6.  Given her history is osteopenia I  would like her to see the bone density clinic and have a repeat bone density scan as the last 1 was approximately 5 years ago.  This will help us  in optimizing her for success surgically if we do have to go forward with a spinal fusion type procedure. I would like to follow up after her updated bone density testing and osteoporosis consult.    Penne MICAEL Sharps, MD [facility] neurosurgery [place]

## 2024-08-17 ENCOUNTER — Encounter: Payer: Self-pay | Admitting: Neurosurgery

## 2024-08-17 ENCOUNTER — Ambulatory Visit: Admitting: Neurosurgery

## 2024-08-17 VITALS — BP 108/70 | Ht <= 58 in | Wt 134.4 lb

## 2024-08-17 DIAGNOSIS — M5412 Radiculopathy, cervical region: Secondary | ICD-10-CM

## 2024-08-17 DIAGNOSIS — G959 Disease of spinal cord, unspecified: Secondary | ICD-10-CM

## 2024-08-17 DIAGNOSIS — R2689 Other abnormalities of gait and mobility: Secondary | ICD-10-CM | POA: Diagnosis not present

## 2024-08-17 DIAGNOSIS — M8580 Other specified disorders of bone density and structure, unspecified site: Secondary | ICD-10-CM

## 2024-08-17 DIAGNOSIS — M4802 Spinal stenosis, cervical region: Secondary | ICD-10-CM | POA: Diagnosis not present

## 2024-08-17 DIAGNOSIS — G549 Nerve root and plexus disorder, unspecified: Secondary | ICD-10-CM | POA: Insufficient documentation

## 2024-08-29 ENCOUNTER — Encounter: Payer: Self-pay | Admitting: Physician Assistant

## 2024-08-29 ENCOUNTER — Ambulatory Visit (INDEPENDENT_AMBULATORY_CARE_PROVIDER_SITE_OTHER): Admitting: Physician Assistant

## 2024-08-29 DIAGNOSIS — M810 Age-related osteoporosis without current pathological fracture: Secondary | ICD-10-CM | POA: Diagnosis not present

## 2024-08-29 NOTE — Progress Notes (Signed)
 Office Visit Note   Patient: Barbara Moss           Date of Birth: 1956-09-28           MRN: [id number] Visit Date: 08/29/2024              Requested by: Claudene Penne ORN, MD [address] [place],  [place] [address] PCP: Epifanio Alm SQUIBB, MD   Assessment & Plan: Visit Diagnoses:  1. Age-related osteoporosis without current pathological fracture     Plan: Patient is a pleasant 68 year old woman that is referred by Dr. Claudene.  She has a history of osteopenia by bone density scan however she has had compression fractures in her spine which diagnoses her as osteoporotic.  She most recently has had 3 injections of Prolia.  Has a bone density scan scheduled in early December.  Her last bone density scan again demonstrated osteopenia by T-score.  She is anticipating surgical intervention for her neck by Dr. Claudene.  He wants to make sure her bone density is optimized for obvious reasons.  She is a liver transplant patient.  She has in addition to being on Prolia was also on Fosamax.  She does have compression fractures in her lumbar spine.  No history of cancer, kidney disease ulcers or gastric bypass.  She does have severe reflux.  She does not have a history of seizure activity.  Never did hormone replacement therapy.  Because she is a liver transplant patient she has her calcium checked quite frequently she is currently not taking calcium because she had some hypercalcemia.  She does take 2000 units of vitamin D a day but has not had her vitamin D checked in quite a while.  Will go forward and check that today she is a former smoker.  She does not drink.  She tries walking.  For exercise she has obvious back problems making it hard to do other exercise but we talked about trying to get involved to doing some resistance training.  She has had no major dental work and unsure of her family history.  I spent 45 minutes discussing treatments and researching her chart.  If she does not have  significant gains with the Prolia would recommend Evenity could do bone markers during treatment    Follow-Up Instructions: Should make appointment to be followed up in osteoporosis clinic after bone density scan completed in December  Orders:  Orders Placed This Encounter  Procedures   Vitamin D (25 hydroxy)   No orders of the defined types were placed in this encounter.     Procedures: No procedures performed   Clinical Data: No additional findings.   Subjective: Chief Complaint  Patient presents with   Osteoporosis    HPI pleasant 68 year old woman referred by Dr. Claudene for evaluation of osteoporosis  Review of Systems  All other systems reviewed and are negative.    Objective: Vital Signs: There were no vitals taken for this visit.  Physical Exam Constitutional:      Appearance: Normal appearance.  Pulmonary:     Effort: Pulmonary effort is normal.  Skin:    General: Skin is warm and dry.  Neurological:     General: No focal deficit present.     Mental Status: She is alert and oriented to person, place, and time.  Psychiatric:        Mood and Affect: Mood normal.        Behavior: Behavior normal.  Specialty Comments:  No specialty comments available.  Imaging: No results found.   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   Hand discomfort 12/20/2014   Conductive  hearing loss of both ears 05/02/2014   Adult hypothyroidism 01/27/2013   Past Medical History:  Diagnosis Date   Abnormal liver enzymes 03/19/2015   Anemia    Hx of   Arthritis    all over   Autoimmune hepatitis (HCC) 04/23/2015   Cirrhosis, 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   GERD (gastroesophageal reflux disease)    Hand discomfort 12/20/2014   Hearing decreased    History of MRSA infection    left thigh   HOH (hard of hearing)    Right is worse/ uses aid in Right   Hypothyroidism    Iron deficiency anemia 03/19/2015   Liver transplanted (HCC) 2019   Motion sickness    on a boat ride   Neck pain    Sleep apnea 03/19/2015   no CPAP, mild case   Spontaneous bacterial peritonitis (HCC)    Thyroid disease 03/19/2015   Vertigo     Family History  Problem Relation Age of Onset   Heart disease Father    Arthritis Sister    Heart disease Mother    Thyroid disease Mother    Cancer Sister    Heart disease Maternal Grandmother    Heart disease Maternal Grandfather    Colon cancer Maternal Aunt    Breast cancer Neg Hx     Past Surgical History:  Procedure Laterality Date   ABDOMINAL HYSTERECTOMY     Bladder tack     COLONOSCOPY WITH PROPOFOL  N/A 04/24/2016   Procedure: COLONOSCOPY WITH PROPOFOL ;  Surgeon: Lamar ONEIDA Holmes, MD;  Location: [facility] ENDOSCOPY;  Service: Endoscopy;  Laterality: N/A;   INNER EAR SURGERY Bilateral 03/19/2012   LIVER TRANSPLANT  2019   NASAL SINUS SURGERY     SALPINGOOPHORECTOMY     Tympanoplasty with mastoidectomy     Social History   Occupational History   Not on file  Tobacco Use   Smoking status: Former    Current packs/day: 0.00    Average packs/day: 0.5 packs/day for 10.0 years (5.0 ttl pk-yrs)    Types: Cigarettes    Start date: 45    Quit date: 71    Years since quitting: 34.9    Passive exposure: Never   Smokeless tobacco: Never  Substance and Sexual Activity   Alcohol use: Not Currently   Drug use: No   Sexual activity:  Not on file

## 2024-08-30 LAB — VITAMIN D 25 HYDROXY (VIT D DEFICIENCY, FRACTURES): Vit D, 25-Hydroxy: 54 ng/mL (ref 30–100)

## 2024-09-15 ENCOUNTER — Encounter: Payer: Self-pay | Admitting: Dermatology

## 2024-09-15 ENCOUNTER — Ambulatory Visit: Payer: Medicare PPO | Admitting: Dermatology

## 2024-09-15 ENCOUNTER — Inpatient Hospital Stay: Admission: RE | Admit: 2024-09-15 | Discharge: 2024-09-15 | Attending: Neurosurgery | Admitting: Neurosurgery

## 2024-09-15 DIAGNOSIS — Z1283 Encounter for screening for malignant neoplasm of skin: Secondary | ICD-10-CM | POA: Diagnosis not present

## 2024-09-15 DIAGNOSIS — G549 Nerve root and plexus disorder, unspecified: Secondary | ICD-10-CM | POA: Diagnosis present

## 2024-09-15 DIAGNOSIS — L578 Other skin changes due to chronic exposure to nonionizing radiation: Secondary | ICD-10-CM

## 2024-09-15 DIAGNOSIS — L814 Other melanin hyperpigmentation: Secondary | ICD-10-CM | POA: Diagnosis not present

## 2024-09-15 DIAGNOSIS — M8580 Other specified disorders of bone density and structure, unspecified site: Secondary | ICD-10-CM | POA: Diagnosis present

## 2024-09-15 DIAGNOSIS — Z780 Asymptomatic menopausal state: Secondary | ICD-10-CM | POA: Diagnosis present

## 2024-09-15 DIAGNOSIS — L821 Other seborrheic keratosis: Secondary | ICD-10-CM

## 2024-09-15 DIAGNOSIS — M4802 Spinal stenosis, cervical region: Secondary | ICD-10-CM | POA: Diagnosis present

## 2024-09-15 DIAGNOSIS — L570 Actinic keratosis: Secondary | ICD-10-CM | POA: Diagnosis not present

## 2024-09-15 DIAGNOSIS — D1801 Hemangioma of skin and subcutaneous tissue: Secondary | ICD-10-CM

## 2024-09-15 DIAGNOSIS — D229 Melanocytic nevi, unspecified: Secondary | ICD-10-CM

## 2024-09-15 DIAGNOSIS — W908XXA Exposure to other nonionizing radiation, initial encounter: Secondary | ICD-10-CM

## 2024-09-15 NOTE — Progress Notes (Signed)
 Follow-Up Visit   Subjective  Barbara Moss is a 68 y.o. female who presents for the following: Skin Cancer Screening and Full Body Skin Exam, hx of Aks, check spot L chest, L foot  The patient presents for Total-Body Skin Exam (TBSE) for skin cancer screening and mole check. The patient has spots, moles and lesions to be evaluated, some may be new or changing and the patient may have concern these could be cancer.    The following portions of the chart were reviewed this encounter and updated as appropriate: medications, allergies, medical history  Review of Systems:  No other skin or systemic complaints except as noted in HPI or Assessment and Plan.  Objective  Well appearing patient in no apparent distress; mood and affect are within normal limits.  A full examination was performed including scalp, head, eyes, ears, nose, lips, neck, chest, axillae, abdomen, back, buttocks, bilateral upper extremities, bilateral lower extremities, hands, feet, fingers, toes, fingernails, and toenails. All findings within normal limits unless otherwise noted below.   Relevant physical exam findings are noted in the Assessment and Plan.  L frontal hairline at hair part x 1, superior to L mid brow x 1 (2) Pink scaly macules  Assessment & Plan   SKIN CANCER SCREENING PERFORMED TODAY.  ACTINIC DAMAGE - Chronic condition, secondary to cumulative UV/sun exposure - diffuse scaly erythematous macules with underlying dyspigmentation - Recommend daily broad spectrum sunscreen SPF 30+ to sun-exposed areas, reapply every 2 hours as needed.  - Staying in the shade or wearing long sleeves, sun glasses (UVA+UVB protection) and wide brim hats (4-inch brim around the entire circumference of the hat) are also recommended for sun protection.  - Call for new or changing lesions.  LENTIGINES, SEBORRHEIC KERATOSES, HEMANGIOMAS - Benign normal skin lesions - Benign-appearing - Call for any changes - SK L  chest, L dorsum foot  MELANOCYTIC NEVI - Tan-brown and/or pink-flesh-colored symmetric macules and papules - Benign appearing on exam today - Observation - Call clinic for new or changing moles - Recommend daily use of broad spectrum spf 30+ sunscreen to sun-exposed areas.  AK (ACTINIC KERATOSIS) (2) L frontal hairline at hair part x 1, superior to L mid brow x 1 (2) Actinic keratoses are precancerous spots that appear secondary to cumulative UV radiation exposure/sun exposure over time. They are chronic with expected duration over 1 year. A portion of actinic keratoses will progress to squamous cell carcinoma of the skin. It is not possible to reliably predict which spots will progress to skin cancer and so treatment is recommended to prevent development of skin cancer.  Recommend daily broad spectrum sunscreen SPF 30+ to sun-exposed areas, reapply every 2 hours as needed.  Recommend staying in the shade or wearing long sleeves, sun glasses (UVA+UVB protection) and wide brim hats (4-inch brim around the entire circumference of the hat). Call for new or changing lesions. Destruction of lesion - L frontal hairline at hair part x 1, superior to L mid brow x 1 (2) Complexity: simple   Destruction method: cryotherapy   Informed consent: discussed and consent obtained   Timeout:  patient name, date of birth, surgical site, and procedure verified Lesion destroyed using liquid nitrogen: Yes   Region frozen until ice ball extended beyond lesion: Yes   Cryo cycles: 1 or 2. Outcome: patient tolerated procedure well with no complications   Post-procedure details: wound care instructions given    LENTIGINES   MULTIPLE BENIGN NEVI   SEBORRHEIC  KERATOSES   ACTINIC ELASTOSIS   CHERRY ANGIOMA    Return in about 1 year (around 09/15/2025) for TBSE, Hx of AKs.  I, Grayce Saunas, RMA, am acting as scribe for Boneta Sharps, MD .   Documentation: I have reviewed the above documentation  for accuracy and completeness, and I agree with the above.  Boneta Sharps, MD

## 2024-09-15 NOTE — Patient Instructions (Addendum)
 Cryotherapy Aftercare  Wash gently with soap and water everyday.   Apply Vaseline and Band-Aid daily until healed.    Recommend daily broad spectrum sunscreen SPF 30+ to sun-exposed areas, reapply every 2 hours as needed. Call for new or changing lesions.  Staying in the shade or wearing long sleeves, sun glasses (UVA+UVB protection) and wide brim hats (4-inch brim around the entire circumference of the hat) are also recommended for sun protection.     Due to recent changes in healthcare laws, you may see results of your pathology and/or laboratory studies on MyChart before the doctors have had a chance to review them. We understand that in some cases there may be results that are confusing or concerning to you. Please understand that not all results are received at the same time and often the doctors may need to interpret multiple results in order to provide you with the best plan of care or course of treatment. Therefore, we ask that you please give us  2 business days to thoroughly review all your results before contacting the office for clarification. Should we see a critical lab result, you will be contacted sooner.   If You Need Anything After Your Visit  If you have any questions or concerns for your doctor, please call our main line at [phone] and press option 4 to reach your doctor's medical assistant. If no one answers, please leave a voicemail as directed and we will return your call as soon as possible. Messages left after 4 pm will be answered the following business day.   You may also send us  a message via MyChart. We typically respond to MyChart messages within 1-2 business days.  For prescription refills, please ask your pharmacy to contact our office. Our fax number is [phone].  If you have an urgent issue when the clinic is closed that cannot wait until the next business day, you can page your doctor at the number below.    Please note that while we do our best to  be available for urgent issues outside of office hours, we are not available 24/7.   If you have an urgent issue and are unable to reach us , you may choose to seek medical care at your doctor's office, retail clinic, urgent care center, or emergency room.  If you have a medical emergency, please immediately call 911 or go to the emergency department.  Pager Numbers  - Dr. Hester: [phone]  - Dr. Jackquline: [phone]  - Dr. Claudene: [phone]   - Dr. Raymund: [phone]  In the event of inclement weather, please call our main line at [phone] for an update on the status of any delays or closures.  Dermatology Medication Tips: Please keep the boxes that topical medications come in in order to help keep track of the instructions about where and how to use these. Pharmacies typically print the medication instructions only on the boxes and not directly on the medication tubes.   If your medication is too expensive, please contact our office at [phone] option 4 or send us  a message through MyChart.   We are unable to tell what your co-pay for medications will be in advance as this is different depending on your insurance coverage. However, we may be able to find a substitute medication at lower cost or fill out paperwork to get insurance to cover a needed medication.   If a prior authorization is required to get your medication covered by your insurance company, please allow us  1-2  business days to complete this process.  Drug prices often vary depending on where the prescription is filled and some pharmacies may offer cheaper prices.  The website www.goodrx.com contains coupons for medications through different pharmacies. The prices here do not account for what the cost may be with help from insurance (it may be cheaper with your insurance), but the website can give you the price if you did not use any insurance.  - You can print the associated coupon and take it with your  prescription to the pharmacy.  - You may also stop by our office during regular business hours and pick up a GoodRx coupon card.  - If you need your prescription sent electronically to a different pharmacy, notify our office through [facility] or by phone at [phone] option 4.     Si Usted Necesita Algo Despus de Su Visita  Tambin puede enviarnos un mensaje a travs de Clinical cytogeneticist. Por lo general respondemos a los mensajes de MyChart en el transcurso de 1 a 2 das hbiles.  Para renovar recetas, por favor pida a su farmacia que se ponga en contacto con nuestra oficina. Randi lakes de fax es [place] [phone].  Si tiene un asunto urgente cuando la clnica est cerrada y que no puede esperar hasta el siguiente da hbil, puede llamar/localizar a su doctor(a) al nmero que aparece a continuacin.   Por favor, tenga en cuenta que aunque hacemos todo lo posible para estar disponibles para asuntos urgentes fuera del horario de [place], no estamos disponibles las 24 horas del da, los 7 [address]  [address] de la [place].   Si tiene un problema urgente y no puede comunicarse con nosotros, puede optar por buscar atencin mdica  en el consultorio de su doctor(a), en una clnica privada, en un centro de atencin urgente o en una sala de emergencias.  Si tiene Engineer, drilling, por favor llame inmediatamente al 911 o vaya a la sala de emergencias.  Nmeros de bper  - Dr. Hester: [phone]  - Dra. Jackquline: [phone]  - Dr. Claudene: [phone]  - Dra. Kitts: [phone]  En caso de inclemencias del [place], por favor llame a nuestra lnea principal al [phone] para una actualizacin sobre el estado de cualquier retraso o cierre.  Consejos para la medicacin en dermatologa: Por favor, guarde las cajas en las que vienen los medicamentos de uso tpico para ayudarle a seguir las instrucciones sobre dnde y cmo usarlos. Las farmacias generalmente imprimen las instrucciones del  medicamento slo en las cajas y no directamente en los tubos del [place].   Si su medicamento es muy caro, por favor, pngase en contacto con landry rieger llamando al [phone] y presione la opcin 4 o envenos un mensaje a travs de Clinical cytogeneticist.   No podemos decirle cul ser su copago por los medicamentos por adelantado ya que esto es diferente dependiendo de la cobertura de su seguro. Sin embargo, es posible que podamos encontrar un medicamento sustituto a Audiological scientist un formulario para que el seguro cubra el medicamento que se considera necesario.   Si se requiere una autorizacin previa para que su compaa de seguros malta su medicamento, por favor permtanos de 1 a 2 das hbiles para completar este proceso.  Los precios de los medicamentos varan con frecuencia dependiendo del Environmental consultant de dnde se surte la receta y alguna farmacias pueden ofrecer precios ms baratos.  El sitio web www.goodrx.com tiene cupones para medicamentos de Health and safety inspector. Los precios aqu no tienen en cuenta  lo que podra costar con la ayuda del seguro (puede ser ms barato con su seguro), pero el sitio web puede darle el precio si no Visual merchandiser.  - Puede imprimir el cupn correspondiente y llevarlo con su receta a la farmacia.  - Tambin puede pasar por nuestra oficina durante el horario de atencin regular y Education officer, museum una tarjeta de cupones de GoodRx.  - Si necesita que su receta se enve electrnicamente a una farmacia diferente, informe a nuestra oficina a travs de MyChart de [place] o por telfono llamando al [phone] y presione la opcin 4.

## 2024-09-15 NOTE — Progress Notes (Deleted)
 Referring Physician:  Avanell Katz, MD [address] [place],  [place] [address]  Primary Physician:  Epifanio Alm SQUIBB, MD  *** discuss DEXA findings  Discussed the use of AI scribe software for clinical note transcription with the patient, who gave verbal consent to proceed.  History of Present Illness     History of Present Illness: 08/17/2024  Patient is here to see me again.  We have been following her for development of myelomalacia.  She feels like her walking is not getting any better and continues to have issues.  She also has significant neck pain which has not gotten any better.  She does feel like her hands are slowly getting worse as far as her dexterity goes.  She feels like she gets continued posterior headaches which seem to be cervicogenic in nature, she does carry a history of osteopenia  Conservative measures:  Physical therapy:  has participated in the past at [facility] but no PT in 2025 or 2024 Multimodal medical therapy including regular antiinflammatories:  tramadol, methocarbamol, hydrocodone, tylenol  Injections:   08/04/2024: Left C4-5 transforaminal ESI (dexamethasone 13 mg) 07/01/2024: Left C4-5 transforaminal ESI (50% relief for 10 days, dexamethasone 10 mg) 09/22/2023: Left S1 transforaminal ESI (80% relief) 09/08/2023: left S1 trigger point injection (moderate relief) 05/13/2023: left trapezius ridge trigger point injection 02/06/2022: Left S1 transforaminal ESI (90% relief) 10/09/2021: Right subacromial injection (good relief) 09/21/2020: RFA to the bilateral L4-5 and L5-S1 facet joints (98% relief) 08/07/2020: MBB to the bilateral L4-5 and L5-S1 facet joints (7/10 to 1/10) 07/24/2020: MBB to the bilateral L4-5 and L5-S1 facet joints (8/10 to 1/10) 07/02/2020: Bilateral L5 trigger point injections (moderate to good relief) 05/02/2020: Left S1 transforaminal ESI (brief increase in pain and moderate relief)  03/30/2020: Left L5  trigger point injection (good relief) 03/16/2020: Right subacromial injection (moderate relief) 11/18/2019: Bilateral superior gluteal trigger point injections (good relief for 3 months) 07/22/2019: Bilateral superior gluteal trigger point injections (good relief for approximately 3.5 months) 04/22/2019: Bilateral superior gluteal trigger point injections (moderate relief x1 to 2 weeks) 03/04/2019: Bilateral S1 transforaminal ESI (50% relief) 12/17/2018: Bilateral S1 transforaminal ESI (improvement of posterior buttock and posterior thigh pain) 11/22/2018: Bilateral S1 transforaminal ESI (improvement of posterior buttock and posterior thigh pain) 01/28/2018: Left C4-5 transforaminal ESI 12/28/2017: Left trapezius ridge trigger point injection 11/02/2017: Left trapezius ridge trigger point injection (good relief for 1 month) 10/08/2017: Left C4-5 transforaminal ESI (good relief 1 month) 08/17/2017: Left C4-5 transforaminal ESI (good relief)   Past Surgery: no spinal surgeries   Bonney DEL Genna has  symptoms of cervical myelopathy.  The symptoms are causing a significant impact on the patient's life.   Review of Systems:  A 10 point review of systems is negative, except for the pertinent positives and negatives detailed in the HPI.  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   GERD (gastroesophageal reflux disease)    Hand discomfort 12/20/2014   Hearing decreased    History of MRSA infection    left thigh   HOH (hard of hearing)    Right is worse/ uses aid in Right   Hypothyroidism    Iron deficiency anemia 03/19/2015   Liver transplanted (HCC) 2019   Motion sickness    on a boat ride  Neck pain    Sleep apnea 03/19/2015   no CPAP, mild case   Spontaneous bacterial peritonitis (HCC)     Thyroid disease 03/19/2015   Vertigo     Past Surgical History: Past Surgical History:  Procedure Laterality Date   ABDOMINAL HYSTERECTOMY     Bladder tack     COLONOSCOPY WITH PROPOFOL  N/A 04/24/2016   Procedure: COLONOSCOPY WITH PROPOFOL ;  Surgeon: Lamar ONEIDA Holmes, MD;  Location: [facility] ENDOSCOPY;  Service: Endoscopy;  Laterality: N/A;   INNER EAR SURGERY Bilateral 03/19/2012   LIVER TRANSPLANT  2019   NASAL SINUS SURGERY     SALPINGOOPHORECTOMY     Tympanoplasty with mastoidectomy      Allergies: Allergies as of 06/08/2024   (No Known Allergies)    Medications: Outpatient Encounter Medications as of 06/08/2024  Medication Sig   acetaminophen (TYLENOL) 500 MG tablet Take 500 mg by mouth as needed.   calcium-vitamin D  (OSCAL WITH D) 500-200 MG-UNIT per tablet Take 1 tablet by mouth.   Cholecalciferol 50 MCG (2000 UT) TABS Take 2,000 Units by mouth daily.   Golimumab 50 MG/0.5ML SOAJ Inject into the skin.   HYDROcodone-acetaminophen (NORCO/VICODIN) 5-325 MG tablet Take 1 tablet by mouth every 4 (four) hours as needed.   hydroxychloroquine (PLAQUENIL) 200 MG tablet Take 200 mg by mouth daily.   levothyroxine (SYNTHROID) 75 MCG tablet Take 75 mcg by mouth daily before breakfast.   omeprazole (PRILOSEC) 40 MG capsule TAKE 1 CAPSULE BY MOUTH ONCE DAILY FOR REFLUX.   polyethylene glycol powder (GLYCOLAX/MIRALAX) 17 GM/SCOOP powder Take 17 g by mouth as needed.   predniSONE  (DELTASONE ) 5 MG tablet Take 1 tablet by mouth daily.   PROLIA 60 MG/ML SOSY injection Inject into the skin.   tacrolimus (PROGRAF) 1 MG capsule Take 2 capsules by mouth in the morning and at bedtime.   Vibegron  (GEMTESA ) 75 MG TABS Take 1 tablet (75 mg total) by mouth daily.   methocarbamol (ROBAXIN) 500 MG tablet 1/2-1 po qBID prn (Patient not taking: Reported on 06/08/2024)   polyethylene glycol (GOLYTELY) 236 g solution Take by mouth as directed per [facility] GI prep instructions, for split bowel prep. (Patient not  taking: Reported on 06/08/2024)   traMADol (ULTRAM) 50 MG tablet Take 50 mg by mouth as needed. (Patient not taking: Reported on 06/08/2024)   [DISCONTINUED] ciprofloxacin  (CIPRO ) 500 MG tablet Take 1 tablet the day before, 1 tablet the day of and 1 tablet the day after BOTOX  (Patient not taking: Reported on 02/04/2023)   [DISCONTINUED] fluorouracil  (EFUDEX ) 5 % cream Apply topically 2 (two) times daily. For 7 days to right cheek as directed (Patient not taking: Reported on 02/04/2023)   [DISCONTINUED] levothyroxine (SYNTHROID, LEVOTHROID) 88 MCG tablet Take 88 mcg by mouth daily before breakfast.   No facility-administered encounter medications on file as of 06/08/2024.    Social History: Social History   Tobacco Use   Smoking status: Former    Current packs/day: 0.00    Average packs/day: 0.5 packs/day for 10.0 years (5.0 ttl pk-yrs)    Types: Cigarettes    Start date: 47    Quit date: 1991    Years since quitting: 34.6    Passive exposure: Never   Smokeless tobacco: Never  Substance Use Topics   Alcohol use: Not Currently   Drug use: No    Family Medical History: Family History  Problem Relation Age of Onset   Heart disease Father    Arthritis Sister    Heart  disease Mother    Thyroid disease Mother    Cancer Sister    Heart disease Maternal Grandmother    Heart disease Maternal Grandfather    Colon cancer Maternal Aunt    Breast cancer Neg Hx     Physical Examination:  NEUROLOGICAL:     Awake, alert, oriented to person, place, and time.  Speech is clear and fluent. Fund of knowledge is appropriate.   Cranial Nerves: Pupils equal round and reactive to light.  Facial tone is symmetric.   ROM of spine: Decreased range of motion of cervical spine.  Some tenderness to palpation of cervical paraspinals.  Strength: Side Biceps Triceps Deltoid Interossei Grip Wrist Ext. Wrist Flex.  R 5 5 5 4 4 5 5   L 5 5 5  4+ 4 4+ 5    +Hoffmans  Reflexes are 2+ and symmetric at the  biceps, triceps, brachioradialis. Bilateral upper and lower extremity sensation is intact to light touch.    Slight difficulty with tandem gait. Development of hyperreflexia with a positive Hoffmann sign as well as crossed adductors on the right [place] are 3+ on the right and 2+ on the left  Medical Decision Making  Imaging: BONES AND ALIGNMENT: There is slight reversal of the normal cervical lordosis centered at C3-4. There is trace stepwise retrolisthesis of C3 on C4, C4 on C5, and C5 on C6. Degenerative endplate irregularity and endplate osteophytes at multiple levels. There is no evidence of fracture or suspicious osseous lesion.   SPINAL CORD: The cervical cord is normal in caliber. Subtle cord signal abnormality at C5-6 suggestive of myelomalacia.   SOFT TISSUES: The visualized posterior fossa is unremarkable.   C2-C3: There is a central disc protrusion which indents the ventral thecal sac and contacts the ventral cord without flattening or cord signal abnormality. Bilateral facet arthrosis. Mild bilateral foraminal stenosis.   C3-C4: There is a disc osteophyte complex which indents the ventral thecal sac with flattening of the ventral cord, similar to prior. Mild spinal canal stenosis. Bilateral facet arthrosis. Uncovertebral hypertrophy on the right. There is moderate right foraminal stenosis, increased from prior.   C4-C5: There is a disc osteophyte complex slightly eccentric to the right which indents the ventral thecal sac with flattening of the ventral cord, which is increased from prior. Thickening of the ligaments and flavum. Mild spinal canal stenosis, slightly increased from prior. Bilateral facet arthrosis. Uncovertebral hypertrophy more pronounced on the right. There is severe right and moderate to severe left foraminal stenosis, increased from prior.   C5-C6: There is a disc osteophyte complex which abuts the ventral cord with flattening of the cord.  Thickening of the ligaments and flavum abuts the dorsal cord. Severe spinal canal stenosis with mild cord compression. Bilateral facet arthrosis and uncovertebral hypertrophy with severe bilateral foraminal stenosis, increased from prior.   C6-C7: There is a disc osteophyte complex eccentric to the right which abuts and flattens the ventral cord, slightly increased from prior. Thickening of the ligaments and flavum. Moderate spinal canal stenosis. Bilateral facet arthrosis. Uncovertebral hypertrophy greater on the left. There is severe left foraminal stenosis which is increased.   C7-T1: There is a disc bulge which indents the ventral thecal sac, slightly increased from prior. Bilateral facet arthrosis. There is mild right foraminal stenosis.   IMPRESSION: 1. Degenerative changes as above, increased since 2018. 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 2. Severe bilateral foraminal stenosis at C5-C6,  increased from prior. 3. Severe right and moderate to severe left foraminal stenosis at C4-C5, increased from prior. 4. Severe left foraminal stenosis at C6-C7, increased from prior. 5. Moderate right foraminal stenosis at C3-C4, increased from prior.   Narrative & Impression  EXAM: DUAL X-RAY ABSORPTIOMETRY (DXA) FOR BONE MINERAL DENSITY 09/15/2024 9:53 am   CLINICAL DATA:  68 year old Female Postmenopausal. History of osteopenia, 5 years ago, worsening appearance of CT bone structure   History of fragility fracture. Patient is or has been on glucocorticoid therapy.   TECHNIQUE: An axial (e.g., hips, spine) and/or appendicular (e.g., radius) exam was performed, as appropriate, using GE Secretary/administrator at [facility]. Images are obtained for bone mineral density measurement and are not obtained for diagnostic purposes. [id number]   Exclusions: None.   COMPARISON:   None.   FINDINGS: Scan quality: Good.   LUMBAR SPINE (L1-L4):   BMD (in g/cm2): 1.155   T-score: -0.3   Z-score: 1.3   LEFT FEMORAL NECK:   BMD (in g/cm2): 0.798   T-score: -1.7   Z-score: -0.2   LEFT TOTAL HIP:   BMD (in g/cm2): 0.751   T-score: -2.0   Z-score: -0.8   RIGHT FEMORAL NECK:   BMD (in g/cm2): 0.822   T-score: -1.6   Z-score: 0.0   RIGHT TOTAL HIP:   BMD (in g/cm2): 0.739   T-score: -2.1   Z-score: -0.9   LEFT FOREARM (RADIUS 33%):   BMD (in g/cm2): 0.611   T-score: -3.0   Z-score: -1.5   FRAX 10-YEAR PROBABILITY OF FRACTURE:   FRAX not reported as the lowest BMD is not in the osteopenia range.   IMPRESSION: Osteoporosis based on BMD.   Fracture risk is increased. Increased risk is based on low BMD and history of fragility fracture.   RECOMMENDATIONS: 1. All patients should optimize calcium and vitamin D  intake.   2. Consider FDA-approved medical therapies in postmenopausal women and men aged 68 years and older, based on the following:   - A hip or vertebral (clinical or morphometric) fracture   - T-score less than or equal to -2.5 and secondary causes have been excluded.   - Low bone mass (T-score between -1.0 and -2.5) and a 10-year probability of a hip fracture greater than or equal to 3% or a 10-year probability of a major osteoporosis-related fracture greater than or equal to 20% based on the [place] -adapted WHO algorithm.   - Clinician judgment and/or patient preferences may indicate treatment for people with 10-year fracture probabilities above or below these levels   3. Patients with diagnosis of osteoporosis or at high risk for fracture should have regular bone mineral density tests. For patients eligible for Medicare, routine testing is allowed once every 2 years. The testing frequency can be increased to one year for patients who have rapidly progressing disease, those who are receiving or discontinuing  medical therapy to restore bone mass, or have additional risk factors.     Electronically Signed   By: Harrietta Sherry M.D.   On: 09/15/2024 10:31     I have personally reviewed the images and agree with the above interpretation.  Assessment and Plan: Ms. Vaquera is a pleasant 68 y.o. female is here today with a chief complaint of left-sided neck pain that radiates into her left trapezius as well as into the back of her head.  We have been following her for myelomalacia development given her existing severe stenosis at C5-6 she is  developing early signs of myelomalacia/myelopathy at this time.    She is having worsening predominantly on her right upper extremity.  She is noticing weakness numbness and tingling.  She is also not exercising some issues with her gait she is also developing some hyperreflexia on examination consistent with cervical myelopathy.  Given her imaging findings it is consistent with the severe stenosis especially at C5-6.  Given her history is osteopenia I would like her to see the bone density clinic and have a repeat bone density scan as the last 1 was approximately 5 years ago.  This will help us  in optimizing her for success surgically if we do have to go forward with a spinal fusion type procedure. I would like to follow up after her updated bone density testing and osteoporosis consult.    Penne MICAEL Sharps, MD [facility] neurosurgery [place]

## 2024-09-28 ENCOUNTER — Ambulatory Visit: Admitting: Neurosurgery

## 2024-09-29 ENCOUNTER — Encounter: Admitting: Physician Assistant

## 2024-10-01 DIAGNOSIS — T8641 Liver transplant rejection: Principal | ICD-10-CM

## 2024-10-01 DIAGNOSIS — D849 Immunodeficiency, unspecified: Principal | ICD-10-CM

## 2024-10-01 MED ORDER — PREDNISONE 5 MG TABLET
ORAL_TABLET | Freq: Every day | ORAL | 11 refills | 0.00000 days
Start: 2024-10-01 — End: ?

## 2024-10-03 MED ORDER — PREDNISONE 5 MG TABLET
ORAL_TABLET | Freq: Every day | ORAL | 11 refills | 30.00000 days | Status: CP
Start: 2024-10-03 — End: ?

## 2024-10-07 DIAGNOSIS — Z944 Liver transplant status: Principal | ICD-10-CM

## 2024-10-07 DIAGNOSIS — Z796 Long-term use of immunosuppressant medication: Principal | ICD-10-CM

## 2024-10-07 MED ORDER — TACROLIMUS 1 MG CAPSULE, IMMEDIATE-RELEASE
ORAL_CAPSULE | ORAL | 11 refills | 0.00000 days | Status: CP
Start: 2024-10-07 — End: ?

## 2024-10-10 ENCOUNTER — Ambulatory Visit: Admit: 2024-10-10 | Discharge: 2024-10-11 | Payer: Medicare (Managed Care)

## 2024-10-10 DIAGNOSIS — Z944 Liver transplant status: Principal | ICD-10-CM

## 2024-10-10 DIAGNOSIS — Z79899 Other long term (current) drug therapy: Principal | ICD-10-CM

## 2024-11-02 DIAGNOSIS — Z944 Liver transplant status: Principal | ICD-10-CM

## 2024-11-17 ENCOUNTER — Other Ambulatory Visit: Payer: Self-pay | Admitting: Infectious Diseases

## 2024-11-17 DIAGNOSIS — Z1231 Encounter for screening mammogram for malignant neoplasm of breast: Secondary | ICD-10-CM

## 2024-12-08 ENCOUNTER — Encounter

## 2025-06-21 ENCOUNTER — Ambulatory Visit: Admitting: Physician Assistant

## 2025-09-18 ENCOUNTER — Ambulatory Visit: Admitting: Dermatology
